# Patient Record
Sex: Male | Born: 1945 | Race: White | Hispanic: No | Marital: Married | Smoking: Former smoker
Health system: Southern US, Community
[De-identification: ages and names within clinical notes are randomized; demographics above are authoritative.]

## PROBLEM LIST (undated history)

## (undated) DIAGNOSIS — M109 Gout, unspecified: Secondary | ICD-10-CM

## (undated) DIAGNOSIS — M19011 Primary osteoarthritis, right shoulder: Secondary | ICD-10-CM

## (undated) DIAGNOSIS — I77810 Thoracic aortic ectasia: Secondary | ICD-10-CM

## (undated) DIAGNOSIS — I2510 Atherosclerotic heart disease of native coronary artery without angina pectoris: Secondary | ICD-10-CM

## (undated) DIAGNOSIS — Q231 Congenital insufficiency of aortic valve: Secondary | ICD-10-CM

## (undated) DIAGNOSIS — Z9889 Other specified postprocedural states: Secondary | ICD-10-CM

## (undated) DIAGNOSIS — E785 Hyperlipidemia, unspecified: Secondary | ICD-10-CM

## (undated) DIAGNOSIS — I77819 Aortic ectasia, unspecified site: Secondary | ICD-10-CM

## (undated) DIAGNOSIS — T8859XA Other complications of anesthesia, initial encounter: Secondary | ICD-10-CM

## (undated) DIAGNOSIS — K219 Gastro-esophageal reflux disease without esophagitis: Secondary | ICD-10-CM

## (undated) DIAGNOSIS — R112 Nausea with vomiting, unspecified: Secondary | ICD-10-CM

## (undated) DIAGNOSIS — R011 Cardiac murmur, unspecified: Secondary | ICD-10-CM

## (undated) DIAGNOSIS — I420 Dilated cardiomyopathy: Secondary | ICD-10-CM

## (undated) DIAGNOSIS — Q230 Congenital stenosis of aortic valve: Secondary | ICD-10-CM

## (undated) DIAGNOSIS — M1990 Unspecified osteoarthritis, unspecified site: Secondary | ICD-10-CM

## (undated) HISTORY — PX: PILONIDAL CYST EXCISION: SHX744

## (undated) HISTORY — DX: Hyperlipidemia, unspecified: E78.5

## (undated) HISTORY — PX: UPPER GI ENDOSCOPY: SHX6162

## (undated) HISTORY — DX: Congenital insufficiency of aortic valve: Q23.1

## (undated) HISTORY — DX: Aortic ectasia, unspecified site: I77.819

## (undated) HISTORY — DX: Congenital stenosis of aortic valve: Q23.0

## (undated) HISTORY — DX: Atherosclerotic heart disease of native coronary artery without angina pectoris: I25.10

## (undated) HISTORY — PX: COLONOSCOPY: SHX174

## (undated) HISTORY — DX: Thoracic aortic ectasia: I77.810

## (undated) HISTORY — DX: Dilated cardiomyopathy: I42.0

---

## 2000-05-02 ENCOUNTER — Encounter: Payer: Self-pay | Admitting: Emergency Medicine

## 2000-05-02 ENCOUNTER — Encounter: Admission: RE | Admit: 2000-05-02 | Discharge: 2000-05-02 | Payer: Self-pay | Admitting: Emergency Medicine

## 2001-03-11 ENCOUNTER — Encounter: Admission: RE | Admit: 2001-03-11 | Discharge: 2001-03-11 | Payer: Self-pay | Admitting: Emergency Medicine

## 2001-03-11 ENCOUNTER — Encounter: Payer: Self-pay | Admitting: Emergency Medicine

## 2001-08-05 ENCOUNTER — Encounter: Payer: Self-pay | Admitting: Emergency Medicine

## 2001-08-05 ENCOUNTER — Encounter: Admission: RE | Admit: 2001-08-05 | Discharge: 2001-08-05 | Payer: Self-pay | Admitting: Emergency Medicine

## 2001-08-12 ENCOUNTER — Encounter: Admission: RE | Admit: 2001-08-12 | Discharge: 2001-08-12 | Payer: Self-pay | Admitting: Emergency Medicine

## 2001-08-12 ENCOUNTER — Encounter: Payer: Self-pay | Admitting: Emergency Medicine

## 2002-10-28 ENCOUNTER — Encounter: Payer: Self-pay | Admitting: Emergency Medicine

## 2002-10-28 ENCOUNTER — Encounter: Admission: RE | Admit: 2002-10-28 | Discharge: 2002-10-28 | Payer: Self-pay | Admitting: Emergency Medicine

## 2002-11-20 ENCOUNTER — Encounter: Admission: RE | Admit: 2002-11-20 | Discharge: 2002-11-20 | Payer: Self-pay | Admitting: Emergency Medicine

## 2002-11-20 ENCOUNTER — Encounter: Payer: Self-pay | Admitting: Emergency Medicine

## 2006-06-04 HISTORY — PX: SHOULDER ARTHROSCOPY: SHX128

## 2012-12-22 DIAGNOSIS — R011 Cardiac murmur, unspecified: Secondary | ICD-10-CM | POA: Diagnosis not present

## 2012-12-22 DIAGNOSIS — M109 Gout, unspecified: Secondary | ICD-10-CM | POA: Diagnosis not present

## 2012-12-22 DIAGNOSIS — Z1331 Encounter for screening for depression: Secondary | ICD-10-CM | POA: Diagnosis not present

## 2012-12-22 DIAGNOSIS — IMO0002 Reserved for concepts with insufficient information to code with codable children: Secondary | ICD-10-CM | POA: Diagnosis not present

## 2012-12-22 DIAGNOSIS — F329 Major depressive disorder, single episode, unspecified: Secondary | ICD-10-CM | POA: Diagnosis not present

## 2012-12-22 DIAGNOSIS — Z6827 Body mass index (BMI) 27.0-27.9, adult: Secondary | ICD-10-CM | POA: Diagnosis not present

## 2012-12-22 DIAGNOSIS — R5383 Other fatigue: Secondary | ICD-10-CM | POA: Diagnosis not present

## 2012-12-22 DIAGNOSIS — R5381 Other malaise: Secondary | ICD-10-CM | POA: Diagnosis not present

## 2012-12-23 ENCOUNTER — Other Ambulatory Visit (HOSPITAL_COMMUNITY): Payer: Self-pay | Admitting: Internal Medicine

## 2012-12-23 DIAGNOSIS — R011 Cardiac murmur, unspecified: Secondary | ICD-10-CM

## 2012-12-24 ENCOUNTER — Ambulatory Visit (HOSPITAL_COMMUNITY): Payer: Medicare Other | Attending: Cardiology | Admitting: Radiology

## 2012-12-24 VITALS — BP 138/74 | Ht 73.0 in | Wt 196.0 lb

## 2012-12-24 DIAGNOSIS — R011 Cardiac murmur, unspecified: Secondary | ICD-10-CM

## 2012-12-24 NOTE — Progress Notes (Signed)
Echocardiogram performed.  

## 2013-01-21 DIAGNOSIS — L280 Lichen simplex chronicus: Secondary | ICD-10-CM | POA: Diagnosis not present

## 2013-01-21 DIAGNOSIS — L570 Actinic keratosis: Secondary | ICD-10-CM | POA: Diagnosis not present

## 2013-01-21 DIAGNOSIS — D485 Neoplasm of uncertain behavior of skin: Secondary | ICD-10-CM | POA: Diagnosis not present

## 2013-01-21 DIAGNOSIS — L821 Other seborrheic keratosis: Secondary | ICD-10-CM | POA: Diagnosis not present

## 2013-01-21 DIAGNOSIS — D239 Other benign neoplasm of skin, unspecified: Secondary | ICD-10-CM | POA: Diagnosis not present

## 2013-04-02 DIAGNOSIS — Z6827 Body mass index (BMI) 27.0-27.9, adult: Secondary | ICD-10-CM | POA: Diagnosis not present

## 2013-04-02 DIAGNOSIS — N529 Male erectile dysfunction, unspecified: Secondary | ICD-10-CM | POA: Diagnosis not present

## 2013-04-02 DIAGNOSIS — M109 Gout, unspecified: Secondary | ICD-10-CM | POA: Diagnosis not present

## 2013-04-02 DIAGNOSIS — R011 Cardiac murmur, unspecified: Secondary | ICD-10-CM | POA: Diagnosis not present

## 2013-04-02 DIAGNOSIS — IMO0002 Reserved for concepts with insufficient information to code with codable children: Secondary | ICD-10-CM | POA: Diagnosis not present

## 2013-06-18 DIAGNOSIS — M109 Gout, unspecified: Secondary | ICD-10-CM | POA: Diagnosis not present

## 2013-06-18 DIAGNOSIS — Z125 Encounter for screening for malignant neoplasm of prostate: Secondary | ICD-10-CM | POA: Diagnosis not present

## 2013-06-18 DIAGNOSIS — E785 Hyperlipidemia, unspecified: Secondary | ICD-10-CM | POA: Diagnosis not present

## 2013-06-24 DIAGNOSIS — M545 Low back pain, unspecified: Secondary | ICD-10-CM | POA: Diagnosis not present

## 2013-06-24 DIAGNOSIS — M542 Cervicalgia: Secondary | ICD-10-CM | POA: Diagnosis not present

## 2013-06-25 DIAGNOSIS — Z125 Encounter for screening for malignant neoplasm of prostate: Secondary | ICD-10-CM | POA: Diagnosis not present

## 2013-06-25 DIAGNOSIS — E785 Hyperlipidemia, unspecified: Secondary | ICD-10-CM | POA: Diagnosis not present

## 2013-06-25 DIAGNOSIS — IMO0002 Reserved for concepts with insufficient information to code with codable children: Secondary | ICD-10-CM | POA: Diagnosis not present

## 2013-06-25 DIAGNOSIS — H9190 Unspecified hearing loss, unspecified ear: Secondary | ICD-10-CM | POA: Diagnosis not present

## 2013-06-25 DIAGNOSIS — Z23 Encounter for immunization: Secondary | ICD-10-CM | POA: Diagnosis not present

## 2013-06-25 DIAGNOSIS — R413 Other amnesia: Secondary | ICD-10-CM | POA: Diagnosis not present

## 2013-06-25 DIAGNOSIS — Z0000 Encounter for general adult medical examination without abnormal findings: Secondary | ICD-10-CM | POA: Diagnosis not present

## 2013-06-25 DIAGNOSIS — M109 Gout, unspecified: Secondary | ICD-10-CM | POA: Diagnosis not present

## 2013-06-25 DIAGNOSIS — R002 Palpitations: Secondary | ICD-10-CM | POA: Diagnosis not present

## 2013-06-25 DIAGNOSIS — N529 Male erectile dysfunction, unspecified: Secondary | ICD-10-CM | POA: Diagnosis not present

## 2013-06-25 DIAGNOSIS — Z1331 Encounter for screening for depression: Secondary | ICD-10-CM | POA: Diagnosis not present

## 2013-10-12 DIAGNOSIS — M1710 Unilateral primary osteoarthritis, unspecified knee: Secondary | ICD-10-CM | POA: Diagnosis not present

## 2013-12-23 DIAGNOSIS — N529 Male erectile dysfunction, unspecified: Secondary | ICD-10-CM | POA: Diagnosis not present

## 2013-12-23 DIAGNOSIS — IMO0002 Reserved for concepts with insufficient information to code with codable children: Secondary | ICD-10-CM | POA: Diagnosis not present

## 2013-12-23 DIAGNOSIS — F329 Major depressive disorder, single episode, unspecified: Secondary | ICD-10-CM | POA: Diagnosis not present

## 2013-12-23 DIAGNOSIS — M109 Gout, unspecified: Secondary | ICD-10-CM | POA: Diagnosis not present

## 2013-12-23 DIAGNOSIS — F3289 Other specified depressive episodes: Secondary | ICD-10-CM | POA: Diagnosis not present

## 2014-01-20 DIAGNOSIS — L821 Other seborrheic keratosis: Secondary | ICD-10-CM | POA: Diagnosis not present

## 2014-01-20 DIAGNOSIS — L570 Actinic keratosis: Secondary | ICD-10-CM | POA: Diagnosis not present

## 2014-01-20 DIAGNOSIS — D485 Neoplasm of uncertain behavior of skin: Secondary | ICD-10-CM | POA: Diagnosis not present

## 2014-01-20 DIAGNOSIS — D235 Other benign neoplasm of skin of trunk: Secondary | ICD-10-CM | POA: Diagnosis not present

## 2014-03-15 DIAGNOSIS — M545 Low back pain: Secondary | ICD-10-CM | POA: Diagnosis not present

## 2014-03-23 DIAGNOSIS — Z23 Encounter for immunization: Secondary | ICD-10-CM | POA: Diagnosis not present

## 2014-05-10 DIAGNOSIS — M545 Low back pain: Secondary | ICD-10-CM | POA: Diagnosis not present

## 2014-05-10 DIAGNOSIS — Z6827 Body mass index (BMI) 27.0-27.9, adult: Secondary | ICD-10-CM | POA: Diagnosis not present

## 2014-05-14 ENCOUNTER — Other Ambulatory Visit: Payer: Self-pay | Admitting: Neurological Surgery

## 2014-05-14 DIAGNOSIS — M545 Low back pain: Secondary | ICD-10-CM

## 2014-05-19 DIAGNOSIS — M1711 Unilateral primary osteoarthritis, right knee: Secondary | ICD-10-CM | POA: Diagnosis not present

## 2014-05-23 ENCOUNTER — Other Ambulatory Visit: Payer: Medicare Other

## 2014-05-26 ENCOUNTER — Ambulatory Visit
Admission: RE | Admit: 2014-05-26 | Discharge: 2014-05-26 | Disposition: A | Discharge status: Home / Self Care | Payer: Medicare Other | Source: Ambulatory Visit | Attending: Neurological Surgery | Admitting: Neurological Surgery

## 2014-05-26 DIAGNOSIS — M4806 Spinal stenosis, lumbar region: Secondary | ICD-10-CM | POA: Diagnosis not present

## 2014-05-26 DIAGNOSIS — M4316 Spondylolisthesis, lumbar region: Secondary | ICD-10-CM | POA: Diagnosis not present

## 2014-05-26 DIAGNOSIS — M545 Low back pain: Secondary | ICD-10-CM

## 2014-06-07 DIAGNOSIS — M545 Low back pain: Secondary | ICD-10-CM | POA: Diagnosis not present

## 2014-06-07 DIAGNOSIS — R030 Elevated blood-pressure reading, without diagnosis of hypertension: Secondary | ICD-10-CM | POA: Diagnosis not present

## 2014-06-07 DIAGNOSIS — M4120 Other idiopathic scoliosis, site unspecified: Secondary | ICD-10-CM | POA: Diagnosis not present

## 2014-06-07 DIAGNOSIS — Z6827 Body mass index (BMI) 27.0-27.9, adult: Secondary | ICD-10-CM | POA: Diagnosis not present

## 2014-06-09 DIAGNOSIS — M4806 Spinal stenosis, lumbar region: Secondary | ICD-10-CM | POA: Diagnosis not present

## 2014-06-09 DIAGNOSIS — M5416 Radiculopathy, lumbar region: Secondary | ICD-10-CM | POA: Diagnosis not present

## 2014-06-21 DIAGNOSIS — E785 Hyperlipidemia, unspecified: Secondary | ICD-10-CM | POA: Diagnosis not present

## 2014-06-21 DIAGNOSIS — Z0000 Encounter for general adult medical examination without abnormal findings: Secondary | ICD-10-CM | POA: Diagnosis not present

## 2014-06-21 DIAGNOSIS — Z125 Encounter for screening for malignant neoplasm of prostate: Secondary | ICD-10-CM | POA: Diagnosis not present

## 2014-06-21 DIAGNOSIS — M109 Gout, unspecified: Secondary | ICD-10-CM | POA: Diagnosis not present

## 2014-06-28 DIAGNOSIS — Z1389 Encounter for screening for other disorder: Secondary | ICD-10-CM | POA: Diagnosis not present

## 2014-06-28 DIAGNOSIS — R413 Other amnesia: Secondary | ICD-10-CM | POA: Diagnosis not present

## 2014-06-28 DIAGNOSIS — Z125 Encounter for screening for malignant neoplasm of prostate: Secondary | ICD-10-CM | POA: Diagnosis not present

## 2014-06-28 DIAGNOSIS — M5380 Other specified dorsopathies, site unspecified: Secondary | ICD-10-CM | POA: Diagnosis not present

## 2014-06-28 DIAGNOSIS — Z23 Encounter for immunization: Secondary | ICD-10-CM | POA: Diagnosis not present

## 2014-06-28 DIAGNOSIS — H9190 Unspecified hearing loss, unspecified ear: Secondary | ICD-10-CM | POA: Diagnosis not present

## 2014-06-28 DIAGNOSIS — M5416 Radiculopathy, lumbar region: Secondary | ICD-10-CM | POA: Diagnosis not present

## 2014-06-28 DIAGNOSIS — M109 Gout, unspecified: Secondary | ICD-10-CM | POA: Diagnosis not present

## 2014-06-28 DIAGNOSIS — E785 Hyperlipidemia, unspecified: Secondary | ICD-10-CM | POA: Diagnosis not present

## 2014-06-28 DIAGNOSIS — Z008 Encounter for other general examination: Secondary | ICD-10-CM | POA: Diagnosis not present

## 2014-06-28 DIAGNOSIS — R011 Cardiac murmur, unspecified: Secondary | ICD-10-CM | POA: Diagnosis not present

## 2014-06-28 DIAGNOSIS — Z6828 Body mass index (BMI) 28.0-28.9, adult: Secondary | ICD-10-CM | POA: Diagnosis not present

## 2014-07-13 DIAGNOSIS — J0100 Acute maxillary sinusitis, unspecified: Secondary | ICD-10-CM | POA: Diagnosis not present

## 2014-07-13 DIAGNOSIS — J302 Other seasonal allergic rhinitis: Secondary | ICD-10-CM | POA: Diagnosis not present

## 2014-07-13 DIAGNOSIS — Z6829 Body mass index (BMI) 29.0-29.9, adult: Secondary | ICD-10-CM | POA: Diagnosis not present

## 2014-07-13 DIAGNOSIS — R05 Cough: Secondary | ICD-10-CM | POA: Diagnosis not present

## 2014-08-11 DIAGNOSIS — M545 Low back pain: Secondary | ICD-10-CM | POA: Diagnosis not present

## 2014-09-13 DIAGNOSIS — M545 Low back pain: Secondary | ICD-10-CM | POA: Diagnosis not present

## 2014-09-28 DIAGNOSIS — M5416 Radiculopathy, lumbar region: Secondary | ICD-10-CM | POA: Diagnosis not present

## 2014-09-28 DIAGNOSIS — J302 Other seasonal allergic rhinitis: Secondary | ICD-10-CM | POA: Diagnosis not present

## 2014-09-28 DIAGNOSIS — F328 Other depressive episodes: Secondary | ICD-10-CM | POA: Diagnosis not present

## 2014-09-28 DIAGNOSIS — N529 Male erectile dysfunction, unspecified: Secondary | ICD-10-CM | POA: Diagnosis not present

## 2014-09-28 DIAGNOSIS — N401 Enlarged prostate with lower urinary tract symptoms: Secondary | ICD-10-CM | POA: Diagnosis not present

## 2014-09-28 DIAGNOSIS — Z6828 Body mass index (BMI) 28.0-28.9, adult: Secondary | ICD-10-CM | POA: Diagnosis not present

## 2014-10-06 DIAGNOSIS — M545 Low back pain: Secondary | ICD-10-CM | POA: Diagnosis not present

## 2014-10-09 DIAGNOSIS — M545 Low back pain: Secondary | ICD-10-CM | POA: Diagnosis not present

## 2014-10-14 DIAGNOSIS — M545 Low back pain: Secondary | ICD-10-CM | POA: Diagnosis not present

## 2014-11-10 DIAGNOSIS — M545 Low back pain: Secondary | ICD-10-CM | POA: Diagnosis not present

## 2014-11-12 DIAGNOSIS — M545 Low back pain: Secondary | ICD-10-CM | POA: Diagnosis not present

## 2014-11-22 DIAGNOSIS — M545 Low back pain: Secondary | ICD-10-CM | POA: Diagnosis not present

## 2014-11-29 DIAGNOSIS — M545 Low back pain: Secondary | ICD-10-CM | POA: Diagnosis not present

## 2015-01-26 DIAGNOSIS — L570 Actinic keratosis: Secondary | ICD-10-CM | POA: Diagnosis not present

## 2015-01-26 DIAGNOSIS — L814 Other melanin hyperpigmentation: Secondary | ICD-10-CM | POA: Diagnosis not present

## 2015-01-26 DIAGNOSIS — L259 Unspecified contact dermatitis, unspecified cause: Secondary | ICD-10-CM | POA: Diagnosis not present

## 2015-01-26 DIAGNOSIS — L821 Other seborrheic keratosis: Secondary | ICD-10-CM | POA: Diagnosis not present

## 2015-01-26 DIAGNOSIS — D225 Melanocytic nevi of trunk: Secondary | ICD-10-CM | POA: Diagnosis not present

## 2015-03-02 DIAGNOSIS — M545 Low back pain: Secondary | ICD-10-CM | POA: Diagnosis not present

## 2015-03-04 DIAGNOSIS — M545 Low back pain: Secondary | ICD-10-CM | POA: Diagnosis not present

## 2015-03-09 DIAGNOSIS — M545 Low back pain: Secondary | ICD-10-CM | POA: Diagnosis not present

## 2015-03-16 DIAGNOSIS — M545 Low back pain: Secondary | ICD-10-CM | POA: Diagnosis not present

## 2015-03-23 DIAGNOSIS — M545 Low back pain: Secondary | ICD-10-CM | POA: Diagnosis not present

## 2015-03-29 DIAGNOSIS — N401 Enlarged prostate with lower urinary tract symptoms: Secondary | ICD-10-CM | POA: Diagnosis not present

## 2015-03-29 DIAGNOSIS — M5416 Radiculopathy, lumbar region: Secondary | ICD-10-CM | POA: Diagnosis not present

## 2015-03-29 DIAGNOSIS — Z23 Encounter for immunization: Secondary | ICD-10-CM | POA: Diagnosis not present

## 2015-03-29 DIAGNOSIS — M2550 Pain in unspecified joint: Secondary | ICD-10-CM | POA: Diagnosis not present

## 2015-03-29 DIAGNOSIS — Z6828 Body mass index (BMI) 28.0-28.9, adult: Secondary | ICD-10-CM | POA: Diagnosis not present

## 2015-03-29 DIAGNOSIS — N529 Male erectile dysfunction, unspecified: Secondary | ICD-10-CM | POA: Diagnosis not present

## 2015-03-30 DIAGNOSIS — M545 Low back pain: Secondary | ICD-10-CM | POA: Diagnosis not present

## 2015-04-07 DIAGNOSIS — Z6828 Body mass index (BMI) 28.0-28.9, adult: Secondary | ICD-10-CM | POA: Diagnosis not present

## 2015-04-07 DIAGNOSIS — R7989 Other specified abnormal findings of blood chemistry: Secondary | ICD-10-CM | POA: Diagnosis not present

## 2015-04-07 DIAGNOSIS — R2689 Other abnormalities of gait and mobility: Secondary | ICD-10-CM | POA: Diagnosis not present

## 2015-04-08 DIAGNOSIS — R2689 Other abnormalities of gait and mobility: Secondary | ICD-10-CM | POA: Diagnosis not present

## 2015-04-20 DIAGNOSIS — M791 Myalgia: Secondary | ICD-10-CM | POA: Diagnosis not present

## 2015-04-20 DIAGNOSIS — R768 Other specified abnormal immunological findings in serum: Secondary | ICD-10-CM | POA: Diagnosis not present

## 2015-04-20 DIAGNOSIS — M2550 Pain in unspecified joint: Secondary | ICD-10-CM | POA: Diagnosis not present

## 2015-04-26 DIAGNOSIS — M1711 Unilateral primary osteoarthritis, right knee: Secondary | ICD-10-CM | POA: Diagnosis not present

## 2015-06-28 DIAGNOSIS — N5319 Other ejaculatory dysfunction: Secondary | ICD-10-CM | POA: Diagnosis not present

## 2015-06-28 DIAGNOSIS — N5201 Erectile dysfunction due to arterial insufficiency: Secondary | ICD-10-CM | POA: Diagnosis not present

## 2015-06-28 DIAGNOSIS — Z0000 Encounter for general adult medical examination without abnormal findings: Secondary | ICD-10-CM | POA: Diagnosis not present

## 2015-07-05 DIAGNOSIS — N5201 Erectile dysfunction due to arterial insufficiency: Secondary | ICD-10-CM | POA: Diagnosis not present

## 2015-07-14 DIAGNOSIS — Z125 Encounter for screening for malignant neoplasm of prostate: Secondary | ICD-10-CM | POA: Diagnosis not present

## 2015-07-14 DIAGNOSIS — D6489 Other specified anemias: Secondary | ICD-10-CM | POA: Diagnosis not present

## 2015-07-14 DIAGNOSIS — E784 Other hyperlipidemia: Secondary | ICD-10-CM | POA: Diagnosis not present

## 2015-07-14 DIAGNOSIS — M109 Gout, unspecified: Secondary | ICD-10-CM | POA: Diagnosis not present

## 2015-07-20 DIAGNOSIS — M2550 Pain in unspecified joint: Secondary | ICD-10-CM | POA: Diagnosis not present

## 2015-07-20 DIAGNOSIS — M791 Myalgia: Secondary | ICD-10-CM | POA: Diagnosis not present

## 2015-07-20 DIAGNOSIS — R768 Other specified abnormal immunological findings in serum: Secondary | ICD-10-CM | POA: Diagnosis not present

## 2015-07-20 DIAGNOSIS — M150 Primary generalized (osteo)arthritis: Secondary | ICD-10-CM | POA: Diagnosis not present

## 2015-07-21 DIAGNOSIS — R2689 Other abnormalities of gait and mobility: Secondary | ICD-10-CM | POA: Diagnosis not present

## 2015-07-21 DIAGNOSIS — E784 Other hyperlipidemia: Secondary | ICD-10-CM | POA: Diagnosis not present

## 2015-07-21 DIAGNOSIS — N401 Enlarged prostate with lower urinary tract symptoms: Secondary | ICD-10-CM | POA: Diagnosis not present

## 2015-07-21 DIAGNOSIS — Z6827 Body mass index (BMI) 27.0-27.9, adult: Secondary | ICD-10-CM | POA: Diagnosis not present

## 2015-07-21 DIAGNOSIS — M2550 Pain in unspecified joint: Secondary | ICD-10-CM | POA: Diagnosis not present

## 2015-07-21 DIAGNOSIS — D696 Thrombocytopenia, unspecified: Secondary | ICD-10-CM | POA: Diagnosis not present

## 2015-07-21 DIAGNOSIS — M109 Gout, unspecified: Secondary | ICD-10-CM | POA: Diagnosis not present

## 2015-07-21 DIAGNOSIS — Z1389 Encounter for screening for other disorder: Secondary | ICD-10-CM | POA: Diagnosis not present

## 2015-07-21 DIAGNOSIS — D649 Anemia, unspecified: Secondary | ICD-10-CM | POA: Diagnosis not present

## 2015-07-21 DIAGNOSIS — D6489 Other specified anemias: Secondary | ICD-10-CM | POA: Diagnosis not present

## 2015-07-21 DIAGNOSIS — Z0000 Encounter for general adult medical examination without abnormal findings: Secondary | ICD-10-CM | POA: Diagnosis not present

## 2015-07-21 DIAGNOSIS — R413 Other amnesia: Secondary | ICD-10-CM | POA: Diagnosis not present

## 2015-07-21 DIAGNOSIS — I358 Other nonrheumatic aortic valve disorders: Secondary | ICD-10-CM | POA: Diagnosis not present

## 2015-07-27 DIAGNOSIS — D6489 Other specified anemias: Secondary | ICD-10-CM | POA: Diagnosis not present

## 2015-07-27 DIAGNOSIS — R413 Other amnesia: Secondary | ICD-10-CM | POA: Diagnosis not present

## 2015-09-13 DIAGNOSIS — Z0000 Encounter for general adult medical examination without abnormal findings: Secondary | ICD-10-CM | POA: Diagnosis not present

## 2015-09-13 DIAGNOSIS — R361 Hematospermia: Secondary | ICD-10-CM | POA: Diagnosis not present

## 2015-09-14 DIAGNOSIS — L821 Other seborrheic keratosis: Secondary | ICD-10-CM | POA: Diagnosis not present

## 2015-09-14 DIAGNOSIS — D485 Neoplasm of uncertain behavior of skin: Secondary | ICD-10-CM | POA: Diagnosis not present

## 2015-11-07 DIAGNOSIS — M1711 Unilateral primary osteoarthritis, right knee: Secondary | ICD-10-CM | POA: Diagnosis not present

## 2015-12-08 DIAGNOSIS — M1711 Unilateral primary osteoarthritis, right knee: Secondary | ICD-10-CM | POA: Diagnosis not present

## 2015-12-15 DIAGNOSIS — M1711 Unilateral primary osteoarthritis, right knee: Secondary | ICD-10-CM | POA: Diagnosis not present

## 2015-12-27 DIAGNOSIS — M1711 Unilateral primary osteoarthritis, right knee: Secondary | ICD-10-CM | POA: Diagnosis not present

## 2016-01-13 DIAGNOSIS — M25561 Pain in right knee: Secondary | ICD-10-CM | POA: Diagnosis not present

## 2016-01-13 DIAGNOSIS — M25511 Pain in right shoulder: Secondary | ICD-10-CM | POA: Diagnosis not present

## 2016-01-25 DIAGNOSIS — D1801 Hemangioma of skin and subcutaneous tissue: Secondary | ICD-10-CM | POA: Diagnosis not present

## 2016-01-25 DIAGNOSIS — L570 Actinic keratosis: Secondary | ICD-10-CM | POA: Diagnosis not present

## 2016-01-25 DIAGNOSIS — L821 Other seborrheic keratosis: Secondary | ICD-10-CM | POA: Diagnosis not present

## 2016-01-25 DIAGNOSIS — D225 Melanocytic nevi of trunk: Secondary | ICD-10-CM | POA: Diagnosis not present

## 2016-02-02 DIAGNOSIS — R768 Other specified abnormal immunological findings in serum: Secondary | ICD-10-CM | POA: Diagnosis not present

## 2016-02-02 DIAGNOSIS — M150 Primary generalized (osteo)arthritis: Secondary | ICD-10-CM | POA: Diagnosis not present

## 2016-02-02 DIAGNOSIS — M791 Myalgia: Secondary | ICD-10-CM | POA: Diagnosis not present

## 2016-02-02 DIAGNOSIS — M1A09X0 Idiopathic chronic gout, multiple sites, without tophus (tophi): Secondary | ICD-10-CM | POA: Diagnosis not present

## 2016-02-02 DIAGNOSIS — M7989 Other specified soft tissue disorders: Secondary | ICD-10-CM | POA: Diagnosis not present

## 2016-02-02 DIAGNOSIS — M2550 Pain in unspecified joint: Secondary | ICD-10-CM | POA: Diagnosis not present

## 2016-02-20 DIAGNOSIS — M7989 Other specified soft tissue disorders: Secondary | ICD-10-CM | POA: Diagnosis not present

## 2016-02-20 DIAGNOSIS — M791 Myalgia: Secondary | ICD-10-CM | POA: Diagnosis not present

## 2016-02-20 DIAGNOSIS — M2550 Pain in unspecified joint: Secondary | ICD-10-CM | POA: Diagnosis not present

## 2016-02-20 DIAGNOSIS — R768 Other specified abnormal immunological findings in serum: Secondary | ICD-10-CM | POA: Diagnosis not present

## 2016-02-20 DIAGNOSIS — M150 Primary generalized (osteo)arthritis: Secondary | ICD-10-CM | POA: Diagnosis not present

## 2016-02-20 DIAGNOSIS — M1A09X0 Idiopathic chronic gout, multiple sites, without tophus (tophi): Secondary | ICD-10-CM | POA: Diagnosis not present

## 2016-02-28 DIAGNOSIS — L570 Actinic keratosis: Secondary | ICD-10-CM | POA: Diagnosis not present

## 2016-02-28 DIAGNOSIS — D485 Neoplasm of uncertain behavior of skin: Secondary | ICD-10-CM | POA: Diagnosis not present

## 2016-03-29 DIAGNOSIS — M19011 Primary osteoarthritis, right shoulder: Secondary | ICD-10-CM | POA: Diagnosis not present

## 2016-03-29 DIAGNOSIS — M19012 Primary osteoarthritis, left shoulder: Secondary | ICD-10-CM | POA: Diagnosis not present

## 2016-04-14 DIAGNOSIS — Z23 Encounter for immunization: Secondary | ICD-10-CM | POA: Diagnosis not present

## 2016-05-17 DIAGNOSIS — J209 Acute bronchitis, unspecified: Secondary | ICD-10-CM | POA: Diagnosis not present

## 2016-05-17 DIAGNOSIS — J0100 Acute maxillary sinusitis, unspecified: Secondary | ICD-10-CM | POA: Diagnosis not present

## 2016-05-17 DIAGNOSIS — R05 Cough: Secondary | ICD-10-CM | POA: Diagnosis not present

## 2016-05-21 DIAGNOSIS — M150 Primary generalized (osteo)arthritis: Secondary | ICD-10-CM | POA: Diagnosis not present

## 2016-05-21 DIAGNOSIS — M1A09X0 Idiopathic chronic gout, multiple sites, without tophus (tophi): Secondary | ICD-10-CM | POA: Diagnosis not present

## 2016-05-21 DIAGNOSIS — M2550 Pain in unspecified joint: Secondary | ICD-10-CM | POA: Diagnosis not present

## 2016-05-21 DIAGNOSIS — Z6827 Body mass index (BMI) 27.0-27.9, adult: Secondary | ICD-10-CM | POA: Diagnosis not present

## 2016-05-21 DIAGNOSIS — R768 Other specified abnormal immunological findings in serum: Secondary | ICD-10-CM | POA: Diagnosis not present

## 2016-05-21 DIAGNOSIS — E663 Overweight: Secondary | ICD-10-CM | POA: Diagnosis not present

## 2016-06-10 DIAGNOSIS — Z041 Encounter for examination and observation following transport accident: Secondary | ICD-10-CM | POA: Diagnosis not present

## 2016-06-10 DIAGNOSIS — M1990 Unspecified osteoarthritis, unspecified site: Secondary | ICD-10-CM | POA: Diagnosis not present

## 2016-06-13 ENCOUNTER — Other Ambulatory Visit: Payer: Self-pay | Admitting: Orthopedic Surgery

## 2016-06-13 DIAGNOSIS — M19011 Primary osteoarthritis, right shoulder: Secondary | ICD-10-CM | POA: Diagnosis not present

## 2016-06-13 DIAGNOSIS — M25511 Pain in right shoulder: Secondary | ICD-10-CM

## 2016-06-15 ENCOUNTER — Other Ambulatory Visit: Payer: Medicare Other

## 2016-06-15 DIAGNOSIS — I358 Other nonrheumatic aortic valve disorders: Secondary | ICD-10-CM | POA: Diagnosis not present

## 2016-06-15 DIAGNOSIS — J209 Acute bronchitis, unspecified: Secondary | ICD-10-CM | POA: Diagnosis not present

## 2016-06-15 DIAGNOSIS — D649 Anemia, unspecified: Secondary | ICD-10-CM | POA: Diagnosis not present

## 2016-06-15 DIAGNOSIS — D696 Thrombocytopenia, unspecified: Secondary | ICD-10-CM | POA: Diagnosis not present

## 2016-06-15 DIAGNOSIS — R05 Cough: Secondary | ICD-10-CM | POA: Diagnosis not present

## 2016-06-15 DIAGNOSIS — F39 Unspecified mood [affective] disorder: Secondary | ICD-10-CM | POA: Diagnosis not present

## 2016-06-15 DIAGNOSIS — Z1389 Encounter for screening for other disorder: Secondary | ICD-10-CM | POA: Diagnosis not present

## 2016-06-15 DIAGNOSIS — M25511 Pain in right shoulder: Secondary | ICD-10-CM | POA: Diagnosis not present

## 2016-06-15 DIAGNOSIS — Z6829 Body mass index (BMI) 29.0-29.9, adult: Secondary | ICD-10-CM | POA: Diagnosis not present

## 2016-06-16 DIAGNOSIS — M542 Cervicalgia: Secondary | ICD-10-CM | POA: Diagnosis not present

## 2016-06-18 ENCOUNTER — Ambulatory Visit
Admission: RE | Admit: 2016-06-18 | Discharge: 2016-06-18 | Disposition: A | Discharge status: Home / Self Care | Payer: Medicare Other | Source: Ambulatory Visit | Attending: Orthopedic Surgery | Admitting: Orthopedic Surgery

## 2016-06-18 DIAGNOSIS — M19011 Primary osteoarthritis, right shoulder: Secondary | ICD-10-CM | POA: Diagnosis not present

## 2016-06-18 DIAGNOSIS — M25511 Pain in right shoulder: Secondary | ICD-10-CM

## 2016-06-19 DIAGNOSIS — M542 Cervicalgia: Secondary | ICD-10-CM | POA: Diagnosis not present

## 2016-06-22 DIAGNOSIS — M542 Cervicalgia: Secondary | ICD-10-CM | POA: Diagnosis not present

## 2016-06-26 DIAGNOSIS — M542 Cervicalgia: Secondary | ICD-10-CM | POA: Diagnosis not present

## 2016-07-02 DIAGNOSIS — M542 Cervicalgia: Secondary | ICD-10-CM | POA: Diagnosis not present

## 2016-07-11 ENCOUNTER — Other Ambulatory Visit: Payer: Self-pay | Admitting: Orthopedic Surgery

## 2016-07-12 ENCOUNTER — Encounter (HOSPITAL_COMMUNITY)
Admission: RE | Admit: 2016-07-12 | Discharge: 2016-07-12 | Disposition: A | Discharge status: Home / Self Care | Payer: Medicare Other | Source: Ambulatory Visit | Attending: Orthopedic Surgery | Admitting: Orthopedic Surgery

## 2016-07-12 ENCOUNTER — Encounter (HOSPITAL_COMMUNITY): Payer: Self-pay

## 2016-07-12 DIAGNOSIS — Z01812 Encounter for preprocedural laboratory examination: Secondary | ICD-10-CM | POA: Insufficient documentation

## 2016-07-12 DIAGNOSIS — Z01810 Encounter for preprocedural cardiovascular examination: Secondary | ICD-10-CM | POA: Insufficient documentation

## 2016-07-12 DIAGNOSIS — M19011 Primary osteoarthritis, right shoulder: Secondary | ICD-10-CM | POA: Insufficient documentation

## 2016-07-12 HISTORY — DX: Gastro-esophageal reflux disease without esophagitis: K21.9

## 2016-07-12 HISTORY — DX: Unspecified osteoarthritis, unspecified site: M19.90

## 2016-07-12 LAB — CBC
HCT: 40.1 % (ref 39.0–52.0)
Hemoglobin: 13.8 g/dL (ref 13.0–17.0)
MCH: 32.5 pg (ref 26.0–34.0)
MCHC: 34.4 g/dL (ref 30.0–36.0)
MCV: 94.6 fL (ref 78.0–100.0)
Platelets: 126 10*3/uL — ABNORMAL LOW (ref 150–400)
RBC: 4.24 MIL/uL (ref 4.22–5.81)
RDW: 12.8 % (ref 11.5–15.5)
WBC: 5 10*3/uL (ref 4.0–10.5)

## 2016-07-12 LAB — SURGICAL PCR SCREEN
MRSA, PCR: NEGATIVE
Staphylococcus aureus: NEGATIVE

## 2016-07-12 NOTE — Pre-Procedure Instructions (Signed)
    Theodore Patton  07/12/2016      [facility] [address] [place], [place] [address] Phone: [phone] Fax: [phone]    Your procedure is scheduled on Tues. Feb 20  Report to [facility] Admitting at 5:30A.M.  Call this number if you have problems the morning of surgery:  [phone]   Remember:  Do not eat food or drink liquids after midnight on Mon. Feb. 19   Take these medicines the morning of surgery with A SIP OF WATER :tylenol, allopurinol(zyloprim), bupropion(welbutrin), flonase nasal spray if needed, gabapentin(neurontin), tamsulosin(flomax), ventolin inhaler if needed--bring to hospital              1 week prior to surgery stop: aspirin, advil, motrin, ibuprofen, celebrex, BC Powders, Goody's, fish oil,vitamins/herbal medicines   Do not wear jewelry, make-up or nail polish.  Do not wear lotions, powders, or perfumes, or deoderant.  Do not shave 48 hours prior to surgery.  Men may shave face and neck.  Do not bring valuables to the hospital.  [facility] is not responsible for any belongings or valuables.  Contacts, dentures or bridgework may not be worn into surgery.  Leave your suitcase in the car.  After surgery it may be brought to your room.  For patients admitted to the hospital, discharge time will be determined by your treatment team.  Patients discharged the day of surgery will not be allowed to drive home.    Special instructions:  Review preparing for surgery  Please read over the following fact sheets that you were given. Coughing and Deep Breathing and MRSA Information

## 2016-07-12 NOTE — Progress Notes (Signed)
PCP: Dr. Dagmar Hait @ [facility]

## 2016-07-16 DIAGNOSIS — Z125 Encounter for screening for malignant neoplasm of prostate: Secondary | ICD-10-CM | POA: Diagnosis not present

## 2016-07-16 DIAGNOSIS — N401 Enlarged prostate with lower urinary tract symptoms: Secondary | ICD-10-CM | POA: Diagnosis not present

## 2016-07-16 DIAGNOSIS — E784 Other hyperlipidemia: Secondary | ICD-10-CM | POA: Diagnosis not present

## 2016-07-16 DIAGNOSIS — M109 Gout, unspecified: Secondary | ICD-10-CM | POA: Diagnosis not present

## 2016-07-23 DIAGNOSIS — N401 Enlarged prostate with lower urinary tract symptoms: Secondary | ICD-10-CM | POA: Diagnosis not present

## 2016-07-23 DIAGNOSIS — Z6828 Body mass index (BMI) 28.0-28.9, adult: Secondary | ICD-10-CM | POA: Diagnosis not present

## 2016-07-23 DIAGNOSIS — D692 Other nonthrombocytopenic purpura: Secondary | ICD-10-CM | POA: Diagnosis not present

## 2016-07-23 DIAGNOSIS — F39 Unspecified mood [affective] disorder: Secondary | ICD-10-CM | POA: Diagnosis not present

## 2016-07-23 DIAGNOSIS — I358 Other nonrheumatic aortic valve disorders: Secondary | ICD-10-CM | POA: Diagnosis not present

## 2016-07-23 DIAGNOSIS — Z1389 Encounter for screening for other disorder: Secondary | ICD-10-CM | POA: Diagnosis not present

## 2016-07-23 DIAGNOSIS — R7989 Other specified abnormal findings of blood chemistry: Secondary | ICD-10-CM | POA: Diagnosis not present

## 2016-07-23 DIAGNOSIS — M25511 Pain in right shoulder: Secondary | ICD-10-CM | POA: Diagnosis not present

## 2016-07-23 DIAGNOSIS — Z0000 Encounter for general adult medical examination without abnormal findings: Secondary | ICD-10-CM | POA: Diagnosis not present

## 2016-07-23 DIAGNOSIS — E784 Other hyperlipidemia: Secondary | ICD-10-CM | POA: Diagnosis not present

## 2016-07-23 DIAGNOSIS — M109 Gout, unspecified: Secondary | ICD-10-CM | POA: Diagnosis not present

## 2016-07-23 DIAGNOSIS — D696 Thrombocytopenia, unspecified: Secondary | ICD-10-CM | POA: Diagnosis not present

## 2016-07-24 ENCOUNTER — Encounter (HOSPITAL_COMMUNITY)
Admission: RE | Disposition: A | Discharge status: Home / Self Care | Payer: Self-pay | Source: Ambulatory Visit | Attending: Orthopedic Surgery

## 2016-07-24 ENCOUNTER — Inpatient Hospital Stay (HOSPITAL_COMMUNITY): Payer: Medicare Other

## 2016-07-24 ENCOUNTER — Inpatient Hospital Stay (HOSPITAL_COMMUNITY): Payer: Medicare Other | Admitting: Anesthesiology

## 2016-07-24 ENCOUNTER — Inpatient Hospital Stay (HOSPITAL_COMMUNITY)
Admission: RE | Admit: 2016-07-24 | Discharge: 2016-07-26 | DRG: 483 | Disposition: A | Discharge status: Home / Self Care | Payer: Medicare Other | Source: Ambulatory Visit | Attending: Orthopedic Surgery | Admitting: Orthopedic Surgery

## 2016-07-24 ENCOUNTER — Encounter (HOSPITAL_COMMUNITY): Payer: Self-pay

## 2016-07-24 DIAGNOSIS — Z471 Aftercare following joint replacement surgery: Secondary | ICD-10-CM | POA: Diagnosis not present

## 2016-07-24 DIAGNOSIS — Z882 Allergy status to sulfonamides status: Secondary | ICD-10-CM

## 2016-07-24 DIAGNOSIS — Z87891 Personal history of nicotine dependence: Secondary | ICD-10-CM | POA: Diagnosis not present

## 2016-07-24 DIAGNOSIS — Z79899 Other long term (current) drug therapy: Secondary | ICD-10-CM | POA: Diagnosis not present

## 2016-07-24 DIAGNOSIS — M25711 Osteophyte, right shoulder: Secondary | ICD-10-CM | POA: Diagnosis not present

## 2016-07-24 DIAGNOSIS — Z7982 Long term (current) use of aspirin: Secondary | ICD-10-CM | POA: Diagnosis not present

## 2016-07-24 DIAGNOSIS — M19011 Primary osteoarthritis, right shoulder: Secondary | ICD-10-CM | POA: Diagnosis not present

## 2016-07-24 DIAGNOSIS — Z96611 Presence of right artificial shoulder joint: Secondary | ICD-10-CM | POA: Diagnosis not present

## 2016-07-24 DIAGNOSIS — K219 Gastro-esophageal reflux disease without esophagitis: Secondary | ICD-10-CM | POA: Diagnosis not present

## 2016-07-24 DIAGNOSIS — G8918 Other acute postprocedural pain: Secondary | ICD-10-CM | POA: Diagnosis not present

## 2016-07-24 HISTORY — DX: Primary osteoarthritis, right shoulder: M19.011

## 2016-07-24 HISTORY — PX: TOTAL SHOULDER ARTHROPLASTY: SHX126

## 2016-07-24 SURGERY — ARTHROPLASTY, SHOULDER, TOTAL
Anesthesia: Regional | Laterality: Right

## 2016-07-24 MED ORDER — PHENYLEPHRINE 40 MCG/ML (10ML) SYRINGE FOR IV PUSH (FOR BLOOD PRESSURE SUPPORT)
PREFILLED_SYRINGE | INTRAVENOUS | Status: AC
  Filled 2016-07-24: qty 10

## 2016-07-24 MED ORDER — ONDANSETRON HCL 4 MG PO TABS: 4.0000 mg | ORAL_TABLET | Freq: Four times a day (QID) | ORAL | Status: DC | PRN

## 2016-07-24 MED ORDER — SUGAMMADEX SODIUM 200 MG/2ML IV SOLN
INTRAVENOUS | Status: AC
  Filled 2016-07-24: qty 2

## 2016-07-24 MED ORDER — MIDAZOLAM HCL 5 MG/5ML IJ SOLN
INTRAMUSCULAR | Status: DC | PRN
  Administered 2016-07-24: 1 mg via INTRAVENOUS

## 2016-07-24 MED ORDER — SUCCINYLCHOLINE CHLORIDE 20 MG/ML IJ SOLN
INTRAMUSCULAR | Status: DC | PRN
  Administered 2016-07-24: 200 mg via INTRAVENOUS

## 2016-07-24 MED ORDER — CEFAZOLIN IN D5W 1 GM/50ML IV SOLN
1.0000 g | Freq: Four times a day (QID) | INTRAVENOUS | Status: AC
  Administered 2016-07-24 – 2016-07-25 (×3): 1 g via INTRAVENOUS
  Filled 2016-07-24 (×3): qty 50

## 2016-07-24 MED ORDER — ASPIRIN EC 81 MG PO TBEC
81.0000 mg | DELAYED_RELEASE_TABLET | Freq: Every day | ORAL | Status: DC
  Administered 2016-07-24 – 2016-07-25 (×2): 81 mg via ORAL
  Filled 2016-07-24 (×2): qty 1

## 2016-07-24 MED ORDER — EPHEDRINE 5 MG/ML INJ
INTRAVENOUS | Status: AC
  Filled 2016-07-24: qty 10

## 2016-07-24 MED ORDER — METHOCARBAMOL 1000 MG/10ML IJ SOLN: 500.0000 mg | Freq: Four times a day (QID) | INTRAVENOUS | Status: DC | PRN

## 2016-07-24 MED ORDER — SUCCINYLCHOLINE CHLORIDE 200 MG/10ML IV SOSY
PREFILLED_SYRINGE | INTRAVENOUS | Status: AC
  Filled 2016-07-24: qty 10

## 2016-07-24 MED ORDER — MENTHOL 3 MG MT LOZG: 1.0000 | LOZENGE | OROMUCOSAL | Status: DC | PRN

## 2016-07-24 MED ORDER — BISACODYL 10 MG RE SUPP: 10.0000 mg | Freq: Every day | RECTAL | Status: DC | PRN

## 2016-07-24 MED ORDER — ROCURONIUM BROMIDE 100 MG/10ML IV SOLN
INTRAVENOUS | Status: DC | PRN
  Administered 2016-07-24: 50 mg via INTRAVENOUS

## 2016-07-24 MED ORDER — METOCLOPRAMIDE HCL 5 MG PO TABS: 5.0000 mg | ORAL_TABLET | Freq: Three times a day (TID) | ORAL | Status: DC | PRN

## 2016-07-24 MED ORDER — DIPHENHYDRAMINE HCL 12.5 MG/5ML PO ELIX: 12.5000 mg | ORAL_SOLUTION | ORAL | Status: DC | PRN

## 2016-07-24 MED ORDER — ONDANSETRON HCL 4 MG/2ML IJ SOLN
INTRAMUSCULAR | Status: AC
  Filled 2016-07-24: qty 2

## 2016-07-24 MED ORDER — PROPOFOL 10 MG/ML IV BOLUS
INTRAVENOUS | Status: AC
  Filled 2016-07-24: qty 40

## 2016-07-24 MED ORDER — ONDANSETRON HCL 4 MG PO TABS: 4.0000 mg | ORAL_TABLET | Freq: Three times a day (TID) | ORAL | 0 refills | Status: DC | PRN

## 2016-07-24 MED ORDER — MIDAZOLAM HCL 2 MG/2ML IJ SOLN
INTRAMUSCULAR | Status: AC
  Filled 2016-07-24: qty 2

## 2016-07-24 MED ORDER — PHENYLEPHRINE HCL 10 MG/ML IJ SOLN
INTRAMUSCULAR | Status: DC | PRN
  Administered 2016-07-24 (×2): 100 ug/min via INTRAVENOUS

## 2016-07-24 MED ORDER — POLYETHYLENE GLYCOL 3350 17 G PO PACK: 17.0000 g | PACK | Freq: Every day | ORAL | Status: DC | PRN

## 2016-07-24 MED ORDER — HYDROMORPHONE HCL 2 MG/ML IJ SOLN
0.5000 mg | INTRAMUSCULAR | Status: DC | PRN
  Administered 2016-07-24 – 2016-07-25 (×5): 0.5 mg via INTRAVENOUS
  Filled 2016-07-24 (×5): qty 1

## 2016-07-24 MED ORDER — SENNA 8.6 MG PO TABS
1.0000 | ORAL_TABLET | Freq: Two times a day (BID) | ORAL | Status: DC
  Administered 2016-07-24 – 2016-07-26 (×5): 8.6 mg via ORAL
  Filled 2016-07-24 (×5): qty 1

## 2016-07-24 MED ORDER — EPHEDRINE SULFATE 50 MG/ML IJ SOLN
INTRAMUSCULAR | Status: DC | PRN
  Administered 2016-07-24: 5 mg via INTRAVENOUS
  Administered 2016-07-24: 10 mg via INTRAVENOUS
  Administered 2016-07-24: 15 mg via INTRAVENOUS

## 2016-07-24 MED ORDER — FLUTICASONE PROPIONATE 50 MCG/ACT NA SUSP
1.0000 | Freq: Every evening | NASAL | Status: DC | PRN
  Filled 2016-07-24: qty 16

## 2016-07-24 MED ORDER — PHENYLEPHRINE HCL 10 MG/ML IJ SOLN
INTRAMUSCULAR | Status: DC | PRN
  Administered 2016-07-24 (×5): 80 ug via INTRAVENOUS

## 2016-07-24 MED ORDER — SUGAMMADEX SODIUM 200 MG/2ML IV SOLN
INTRAVENOUS | Status: DC | PRN
  Administered 2016-07-24: 200 mg via INTRAVENOUS

## 2016-07-24 MED ORDER — GABAPENTIN 300 MG PO CAPS
600.0000 mg | ORAL_CAPSULE | Freq: Three times a day (TID) | ORAL | Status: DC
  Administered 2016-07-24 – 2016-07-26 (×6): 600 mg via ORAL
  Filled 2016-07-24 (×6): qty 2

## 2016-07-24 MED ORDER — ALUM & MAG HYDROXIDE-SIMETH 200-200-20 MG/5ML PO SUSP
30.0000 mL | ORAL | Status: DC | PRN
  Administered 2016-07-25: 30 mL via ORAL
  Filled 2016-07-24: qty 30

## 2016-07-24 MED ORDER — BACLOFEN 10 MG PO TABS: 10.0000 mg | ORAL_TABLET | Freq: Three times a day (TID) | ORAL | 0 refills | Status: DC

## 2016-07-24 MED ORDER — METOCLOPRAMIDE HCL 5 MG/ML IJ SOLN: 5.0000 mg | Freq: Three times a day (TID) | INTRAMUSCULAR | Status: DC | PRN

## 2016-07-24 MED ORDER — 0.9 % SODIUM CHLORIDE (POUR BTL) OPTIME
TOPICAL | Status: DC | PRN
Start: 2016-07-24 — End: 2016-07-24
  Administered 2016-07-24: 1000 mL

## 2016-07-24 MED ORDER — OXYCODONE-ACETAMINOPHEN 10-325 MG PO TABS: 1.0000 | ORAL_TABLET | Freq: Four times a day (QID) | ORAL | 0 refills | Status: DC | PRN

## 2016-07-24 MED ORDER — LACTATED RINGERS IV SOLN
INTRAVENOUS | Status: DC | PRN
  Administered 2016-07-24 (×2): via INTRAVENOUS

## 2016-07-24 MED ORDER — SENNA-DOCUSATE SODIUM 8.6-50 MG PO TABS: 2.0000 | ORAL_TABLET | Freq: Every day | ORAL | 1 refills | Status: DC

## 2016-07-24 MED ORDER — LIDOCAINE 2% (20 MG/ML) 5 ML SYRINGE
INTRAMUSCULAR | Status: AC
  Filled 2016-07-24: qty 5

## 2016-07-24 MED ORDER — PROPOFOL 10 MG/ML IV BOLUS
INTRAVENOUS | Status: DC | PRN
  Administered 2016-07-24: 160 mg via INTRAVENOUS

## 2016-07-24 MED ORDER — ROCURONIUM BROMIDE 50 MG/5ML IV SOSY
PREFILLED_SYRINGE | INTRAVENOUS | Status: AC
  Filled 2016-07-24: qty 5

## 2016-07-24 MED ORDER — TAMSULOSIN HCL 0.4 MG PO CAPS
0.4000 mg | ORAL_CAPSULE | Freq: Every day | ORAL | Status: DC
  Administered 2016-07-24 – 2016-07-25 (×2): 0.4 mg via ORAL
  Filled 2016-07-24 (×2): qty 1

## 2016-07-24 MED ORDER — ADULT MULTIVITAMIN W/MINERALS CH
1.0000 | ORAL_TABLET | Freq: Every day | ORAL | Status: DC
  Administered 2016-07-24 – 2016-07-25 (×2): 1 via ORAL
  Filled 2016-07-24 (×2): qty 1

## 2016-07-24 MED ORDER — FENTANYL CITRATE (PF) 100 MCG/2ML IJ SOLN
INTRAMUSCULAR | Status: AC
  Filled 2016-07-24: qty 4

## 2016-07-24 MED ORDER — ONDANSETRON HCL 4 MG/2ML IJ SOLN
INTRAMUSCULAR | Status: DC | PRN
  Administered 2016-07-24: 4 mg via INTRAVENOUS

## 2016-07-24 MED ORDER — ACETAMINOPHEN 650 MG RE SUPP: 650.0000 mg | Freq: Four times a day (QID) | RECTAL | Status: DC | PRN

## 2016-07-24 MED ORDER — POTASSIUM CHLORIDE IN NACL 20-0.45 MEQ/L-% IV SOLN
INTRAVENOUS | Status: DC
  Administered 2016-07-24: 14:00:00 via INTRAVENOUS
  Filled 2016-07-24 (×2): qty 1000

## 2016-07-24 MED ORDER — SODIUM CHLORIDE 0.9 % IR SOLN
Status: DC | PRN
  Administered 2016-07-24: 3000 mL

## 2016-07-24 MED ORDER — PHENOL 1.4 % MT LIQD: 1.0000 | OROMUCOSAL | Status: DC | PRN

## 2016-07-24 MED ORDER — DOCUSATE SODIUM 100 MG PO CAPS
100.0000 mg | ORAL_CAPSULE | Freq: Two times a day (BID) | ORAL | Status: DC
  Administered 2016-07-24 – 2016-07-26 (×5): 100 mg via ORAL
  Filled 2016-07-24 (×5): qty 1

## 2016-07-24 MED ORDER — PHENYLEPHRINE HCL 10 MG/ML IJ SOLN
INTRAMUSCULAR | Status: AC
  Filled 2016-07-24: qty 2

## 2016-07-24 MED ORDER — ALLOPURINOL 300 MG PO TABS
300.0000 mg | ORAL_TABLET | Freq: Every day | ORAL | Status: DC
  Administered 2016-07-25 – 2016-07-26 (×2): 300 mg via ORAL
  Filled 2016-07-24 (×2): qty 1

## 2016-07-24 MED ORDER — ONDANSETRON HCL 4 MG/2ML IJ SOLN
4.0000 mg | Freq: Four times a day (QID) | INTRAMUSCULAR | Status: DC | PRN
  Administered 2016-07-25: 4 mg via INTRAVENOUS
  Filled 2016-07-24: qty 2

## 2016-07-24 MED ORDER — MAGNESIUM CITRATE PO SOLN
1.0000 | Freq: Once | ORAL | Status: DC | PRN
Start: 2016-07-24 — End: 2016-07-26

## 2016-07-24 MED ORDER — LORATADINE 10 MG PO TABS
10.0000 mg | ORAL_TABLET | Freq: Every day | ORAL | Status: DC
  Administered 2016-07-24 – 2016-07-25 (×2): 10 mg via ORAL
  Filled 2016-07-24 (×2): qty 1

## 2016-07-24 MED ORDER — OXYCODONE HCL 5 MG PO TABS
5.0000 mg | ORAL_TABLET | ORAL | Status: DC | PRN
  Administered 2016-07-24: 10 mg via ORAL
  Administered 2016-07-24: 5 mg via ORAL
  Administered 2016-07-24 – 2016-07-26 (×13): 10 mg via ORAL
  Filled 2016-07-24 (×11): qty 2
  Filled 2016-07-24: qty 1
  Filled 2016-07-24 (×4): qty 2

## 2016-07-24 MED ORDER — METHOCARBAMOL 500 MG PO TABS
500.0000 mg | ORAL_TABLET | Freq: Four times a day (QID) | ORAL | Status: DC | PRN
  Administered 2016-07-24 – 2016-07-26 (×7): 500 mg via ORAL
  Filled 2016-07-24 (×7): qty 1

## 2016-07-24 MED ORDER — ACETAMINOPHEN 325 MG PO TABS
650.0000 mg | ORAL_TABLET | Freq: Four times a day (QID) | ORAL | Status: DC | PRN
  Administered 2016-07-24 – 2016-07-26 (×6): 650 mg via ORAL
  Filled 2016-07-24 (×6): qty 2

## 2016-07-24 MED ORDER — ALBUTEROL SULFATE (2.5 MG/3ML) 0.083% IN NEBU: 3.0000 mL | INHALATION_SOLUTION | Freq: Four times a day (QID) | RESPIRATORY_TRACT | Status: DC | PRN

## 2016-07-24 MED ORDER — ALBUMIN HUMAN 5 % IV SOLN
INTRAVENOUS | Status: DC | PRN
  Administered 2016-07-24 (×2): via INTRAVENOUS

## 2016-07-24 MED ORDER — FENTANYL CITRATE (PF) 100 MCG/2ML IJ SOLN
INTRAMUSCULAR | Status: DC | PRN
  Administered 2016-07-24 (×2): 50 ug via INTRAVENOUS

## 2016-07-24 MED ORDER — ROPIVACAINE HCL 7.5 MG/ML IJ SOLN
INTRAMUSCULAR | Status: DC | PRN
  Administered 2016-07-24: 20 mL via PERINEURAL

## 2016-07-24 MED ORDER — CEFAZOLIN SODIUM-DEXTROSE 2-4 GM/100ML-% IV SOLN
2.0000 g | INTRAVENOUS | Status: AC
  Administered 2016-07-24: 2 g via INTRAVENOUS
  Filled 2016-07-24: qty 100

## 2016-07-24 MED ORDER — BUPROPION HCL ER (XL) 150 MG PO TB24
300.0000 mg | ORAL_TABLET | Freq: Every day | ORAL | Status: DC
  Administered 2016-07-25 – 2016-07-26 (×2): 300 mg via ORAL
  Filled 2016-07-24 (×2): qty 2

## 2016-07-24 MED ORDER — LIDOCAINE HCL (CARDIAC) 20 MG/ML IV SOLN
INTRAVENOUS | Status: DC | PRN
  Administered 2016-07-24: 100 mg via INTRAVENOUS

## 2016-07-24 SURGICAL SUPPLY — 54 items
BLADE SAW SGTL MED 73X18.5 STR (BLADE) ×3 IMPLANT
CAPT SHLDR TOTAL 2 ×2 IMPLANT
CEMENT BONE DEPUY (Cement) ×2 IMPLANT
CLOSURE STERI-STRIP 1/2X4 (GAUZE/BANDAGES/DRESSINGS) ×1
CLSR STERI-STRIP ANTIMIC 1/2X4 (GAUZE/BANDAGES/DRESSINGS) ×2 IMPLANT
COVER SURGICAL LIGHT HANDLE (MISCELLANEOUS) ×3 IMPLANT
DRAPE ORTHO SPLIT 77X108 STRL (DRAPES) ×6
DRAPE PROXIMA HALF (DRAPES) ×3 IMPLANT
DRAPE SURG ORHT 6 SPLT 77X108 (DRAPES) ×2 IMPLANT
DRAPE U-SHAPE 47X51 STRL (DRAPES) ×3 IMPLANT
DRSG MEPILEX BORDER 4X8 (GAUZE/BANDAGES/DRESSINGS) ×1 IMPLANT
DURAPREP 26ML APPLICATOR (WOUND CARE) ×3 IMPLANT
ELECT BLADE 4.0 EZ CLEAN MEGAD (MISCELLANEOUS) ×3
ELECT REM PT RETURN 9FT ADLT (ELECTROSURGICAL) ×3
ELECTRODE BLDE 4.0 EZ CLN MEGD (MISCELLANEOUS) IMPLANT
ELECTRODE REM PT RTRN 9FT ADLT (ELECTROSURGICAL) ×1 IMPLANT
GLOVE BIOGEL PI ORTHO PRO SZ8 (GLOVE) ×4
GLOVE ORTHO TXT STRL SZ7.5 (GLOVE) ×3 IMPLANT
GLOVE PI ORTHO PRO STRL SZ8 (GLOVE) ×2 IMPLANT
GLOVE SURG ORTHO 8.0 STRL STRW (GLOVE) ×3 IMPLANT
GOWN STRL REUS W/ TWL XL LVL3 (GOWN DISPOSABLE) ×1 IMPLANT
GOWN STRL REUS W/TWL 2XL LVL3 (GOWN DISPOSABLE) ×3 IMPLANT
GOWN STRL REUS W/TWL XL LVL3 (GOWN DISPOSABLE) ×3
HANDPIECE INTERPULSE COAX TIP (DISPOSABLE) ×3
HOOD PEEL AWAY FACE SHEILD DIS (HOOD) ×6 IMPLANT
KIT BASIN OR (CUSTOM PROCEDURE TRAY) ×3 IMPLANT
KIT ROOM TURNOVER OR (KITS) ×3 IMPLANT
MANIFOLD NEPTUNE II (INSTRUMENTS) ×3 IMPLANT
NS IRRIG 1000ML POUR BTL (IV SOLUTION) ×3 IMPLANT
PACK SHOULDER (CUSTOM PROCEDURE TRAY) ×3 IMPLANT
PAD ARMBOARD 7.5X6 YLW CONV (MISCELLANEOUS) ×6 IMPLANT
PIN HUMERAL STMN 3.2MMX9IN (INSTRUMENTS) IMPLANT
SET HNDPC FAN SPRY TIP SCT (DISPOSABLE) ×1 IMPLANT
SLING ARM IMMOBILIZER LRG (SOFTGOODS) IMPLANT
SLING ARM IMMOBILIZER MED (SOFTGOODS) IMPLANT
SMARTMIX MINI TOWER (MISCELLANEOUS) ×3
SPONGE LAP 18X18 X RAY DECT (DISPOSABLE) ×2 IMPLANT
SUCTION FRAZIER HANDLE 10FR (MISCELLANEOUS) ×2
SUCTION TUBE FRAZIER 10FR DISP (MISCELLANEOUS) ×1 IMPLANT
SUPPORT WRAP ARM LG (MISCELLANEOUS) ×3 IMPLANT
SUT FIBERWIRE #2 38 REV NDL BL (SUTURE) ×18
SUT MNCRL AB 4-0 PS2 18 (SUTURE) IMPLANT
SUT VIC AB 0 CT1 27 (SUTURE) ×3
SUT VIC AB 0 CT1 27XBRD ANBCTR (SUTURE) ×1 IMPLANT
SUT VIC AB 2-0 CT1 27 (SUTURE) ×3
SUT VIC AB 2-0 CT1 TAPERPNT 27 (SUTURE) ×1 IMPLANT
SUT VIC AB 3-0 SH 8-18 (SUTURE) ×3 IMPLANT
SUTURE FIBERWR#2 38 REV NDL BL (SUTURE) ×6 IMPLANT
TOWEL OR 17X24 6PK STRL BLUE (TOWEL DISPOSABLE) ×1 IMPLANT
TOWEL OR 17X26 10 PK STRL BLUE (TOWEL DISPOSABLE) ×1 IMPLANT
TOWER SMARTMIX MINI (MISCELLANEOUS) IMPLANT
TUBE CONNECTING 12'X1/4 (SUCTIONS)
TUBE CONNECTING 12X1/4 (SUCTIONS) IMPLANT
YANKAUER SUCT BULB TIP NO VENT (SUCTIONS) ×3 IMPLANT

## 2016-07-24 NOTE — H&P (Signed)
PREOPERATIVE H&P  Chief Complaint: DJD RIGHT SHOULDER  HPI: Theodore Patton is a 71 y.o. male who presents for preoperative history and physical with a diagnosis of DJD RIGHT SHOULDER. Symptoms are rated as moderate to severe, and have been worsening.  This is significantly impairing activities of daily living.  He has elected for surgical management.   He has failed injections, activity modification, previous surgery, anti-inflammatories, and assistive devices.  Preoperative X-rays demonstrate end stage degenerative changes with osteophyte formation, loss of joint space, subchondral sclerosis.   Past Medical History:  Diagnosis Date  . Arthritis   . GERD (gastroesophageal reflux disease)    Past Surgical History:  Procedure Laterality Date  . PILONIDAL CYST EXCISION    . SHOULDER ARTHROSCOPY Right 2008   Social History   Social History  . Marital status: Married    Spouse name: N/A  . Number of children: N/A  . Years of education: N/A   Social History Main Topics  . Smoking status: Former Smoker    Types: Cigarettes    Quit date: 07/12/1980  . Smokeless tobacco: Never Used  . Alcohol use 0.6 oz/week    1 Cans of beer per week  . Drug use: No  . Sexual activity: Not Asked   Other Topics Concern  . None   Social History Narrative  . None   History reviewed. No pertinent family history. Allergies  Allergen Reactions  . Sulfa Antibiotics Other (See Comments)    Blisters in mouth   Prior to Admission medications   Medication Sig Start Date End Date Taking? Authorizing Provider  acetaminophen (TYLENOL) 500 MG tablet Take 1,000 mg by mouth 2 (two) times daily.   Yes Historical Provider, MD  allopurinol (ZYLOPRIM) 300 MG tablet Take 300 mg by mouth daily. 05/16/16  Yes Historical Provider, MD  aspirin EC 81 MG tablet Take 81 mg by mouth at bedtime.   Yes Historical Provider, MD  buPROPion (WELLBUTRIN XL) 300 MG 24 hr tablet Take 300 mg by mouth daily. 06/15/16  Yes  Historical Provider, MD  celecoxib (CELEBREX) 200 MG capsule Take 200 mg by mouth daily at 3 pm.   Yes Historical Provider, MD  fluticasone (FLONASE) 50 MCG/ACT nasal spray Place 1-2 sprays into both nostrils at bedtime as needed. For allergies/nasal congestion 06/11/16  Yes Historical Provider, MD  gabapentin (NEURONTIN) 300 MG capsule Take 600 mg by mouth 3 (three) times daily. 04/03/16  Yes Historical Provider, MD  Glucosamine-Chondroitin (COSAMIN DS PO) Take 2 tablets by mouth 2 (two) times daily.   Yes Historical Provider, MD  loratadine (CLARITIN) 10 MG tablet Take 10 mg by mouth at bedtime.   Yes Historical Provider, MD  Multiple Vitamin (MULTIVITAMIN WITH MINERALS) TABS tablet Take 1 tablet by mouth daily. Complete Multivitamin   Yes Historical Provider, MD  Omega-3 Fatty Acids (FISH OIL) 1200 MG CAPS Take 1,200 mg by mouth 2 (two) times daily.   Yes Historical Provider, MD  tamsulosin (FLOMAX) 0.4 MG CAPS capsule Take 0.4 mg by mouth at bedtime. 05/16/16  Yes Historical Provider, MD  VENTOLIN HFA 108 (90 Base) MCG/ACT inhaler Inhale 1-2 puffs into the lungs every 6 (six) hours as needed. For wheezing/shortness of breath/asthma 05/25/16   Historical Provider, MD     Positive ROS: All other systems have been reviewed and were otherwise negative with the exception of those mentioned in the HPI and as above.  Physical Exam: General: Alert, no acute distress Cardiovascular: No pedal edema Respiratory: No  cyanosis, no use of accessory musculature GI: No organomegaly, abdomen is soft and non-tender Skin: No lesions in the area of chief complaint Neurologic: Sensation intact distally Psychiatric: Patient is competent for consent with normal mood and affect Lymphatic: No axillary or cervical lymphadenopathy  MUSCULOSKELETAL: right shoulder AROM 0-170 ER to 10, positive crepitance with ROM.  Assessment: DJD RIGHT SHOULDER   Plan: Plan for Procedure(s): TOTAL SHOULDER ARTHROPLASTY  The  risks benefits and alternatives were discussed with the patient including but not limited to the risks of nonoperative treatment, versus surgical intervention including infection, bleeding, nerve injury,  blood clots, cardiopulmonary complications, morbidity, mortality, among others, and they were willing to proceed.   Johnny Bridge, MD Cell [phone]   07/24/2016 7:17 AM

## 2016-07-24 NOTE — Progress Notes (Signed)
Report given to maryann shaver rn as caregiver 

## 2016-07-24 NOTE — Op Note (Signed)
07/24/2016  10:11 AM  PATIENT:  Theodore Patton    PRE-OPERATIVE DIAGNOSIS:  RIGHT SHOULDER primary localized osteoarthritis  POST-OPERATIVE DIAGNOSIS:  Same  PROCEDURE:  Right TOTAL SHOULDER ARTHROPLASTY  SURGEON:  Johnny Bridge, MD  PHYSICIAN ASSISTANT: Joya Gaskins, OPA-C, present and scrubbed throughout the case, critical for completion in a timely fashion, and for retraction, instrumentation, and closure.  ANESTHESIA:   General  PREOPERATIVE INDICATIONS:  Theodore Patton is a  71 y.o. male with a diagnosis of primary localized osteoarthritis RIGHT SHOULDER who failed conservative measures and elected for surgical management.    The risks benefits and alternatives were discussed with the patient preoperatively including but not limited to the risks of infection, bleeding, nerve injury, cardiopulmonary complications, the need for revision surgery, dislocation, loosening, incomplete relief of pain, among others, and the patient was willing to proceed.   OPERATIVE IMPLANTS: Biomet size 13 my press-fit humeral stem, size 46+18 Versa-dial humeral head, set in the E position with increased coverage posteriorly, with a medium cemented glenoid polyethylene 3 peg implant with a central regenerex noncemented post.   OPERATIVE FINDINGS: Advanced glenohumeral osteoarthritis involving the glenoid and the humeral head with substantial osteophyte formation inferiorly. There was 1 loose body anteriorly which I found and removed. The glenoid had substantial bone loss, and in order to obtain adequate fixation and the vault, I had to remove some anterior bone to optimize position and version. In doing so, the anterior aspect of the glenoid was somewhat deficient, and I had the superior hole and the posterior hole contained within the vault, but the anterior hole was really only two thirds circumference, and therefore I did not cement the anterior peg, I had bicortical position with the central post as well.      OPERATIVE PROCEDURE: The patient was brought to the operating room and placed in the supine position. General anesthesia was administered. IV antibiotics were given.  The upper extremity was prepped and draped in usual sterile fashion. The patient was in a beachchair position with all bony prominences padded.   Time out was performed and a deltopectoral approach was carried out. The biceps tendon was tenodesed to the pectoralis tendon. The subscapularis was released, tagging it with a #2 FiberWire, leaving a cuff of tendon for repair.   The inferior osteophyte was removed, and release of the capsule off of the humeral side was completed. The head was dislocated, and I reamed sequentially. I placed the humeral cutting guide at 30 of retroversion, and then pinned this into place, and made my humeral neck cut. This was at the appropriate level.   I then placed deep retractors and exposed the glenoid. I excised the labrum circumferentially, taking care to protect the axillary nerve inferiorly.   I then placed a guidewire into the center position, controlling appropriate version and inclination. His glenoid wear was extreme, and he had an extremely flat and widened glenoid face. I then reamed over the guidewire with the small reamer, and was satisfied with the preparation. I removed as much anterior bone as I comfortably could, and I preserved the subchondral bone in order to maximize the strength and minimize the risk for subsequent subsidence.   I then drilled the central hole for the regenerex peg, and then placed the guide, and then drilled the 3 peripheral peg holes. I had excellent bony circumferential contact. The anterior hole as indicated above was not a complete hole, as the anterior third of this hole was breached.  I then cleaned the glenoid, irrigated it copiously, and then dried it and cemented the prosthesis into place. Excellent seating was achieved. I had full exposure. The cement  cured, and then I turned my attention to the humeral side.   I sequentially broached, up to the selected size, with the broach set at 30 of retroversion. I then placed the real stem. I trialed with multiple heads, and the above-named component was selected. Increased posterior coverage improved the coverage. The soft tissue tension was appropriate.   I then impacted the real humeral head into place, reduced the head, and irrigated copiously. Excellent stability and range of motion was achieved. I repaired the subscapularis with 4 #2 FiberWire, as well as the rotator interval, and irrigated copiously once more. The subcutaneous tissue was closed with Vicryl including the deltopectoral fascia.   The skin was closed with Steri-Strips and sterile gauze was applied. He had a preoperative nerve block. He tolerated the procedure well and there were no complications.

## 2016-07-24 NOTE — Progress Notes (Signed)
Orthopedic Tech Progress Note Patient Details:  Theodore Patton [birth year] [id number]  Ortho Devices Type of Ortho Device: Shoulder immobilizer Ortho Device/Splint Interventions: Application   [place] Pink 07/24/2016, 2:46 PM

## 2016-07-24 NOTE — Anesthesia Procedure Notes (Signed)
Procedure Name: Intubation Date/Time: 07/24/2016 7:42 AM Performed by: Scheryl Darter Pre-anesthesia Checklist: Patient identified, Emergency Drugs available, Suction available and Patient being monitored Patient Re-evaluated:Patient Re-evaluated prior to inductionOxygen Delivery Method: Circle System Utilized Preoxygenation: Pre-oxygenation with 100% oxygen Intubation Type: IV induction Ventilation: Mask ventilation without difficulty Laryngoscope Size: Miller and 3 Grade View: Grade I Tube type: Oral Number of attempts: 1 Airway Equipment and Method: Stylet Placement Confirmation: ETT inserted through vocal cords under direct vision,  positive ETCO2 and breath sounds checked- equal and bilateral Tube secured with: Tape Dental Injury: Teeth and Oropharynx as per pre-operative assessment

## 2016-07-24 NOTE — Transfer of Care (Signed)
  Immediate Anesthesia Transfer of Care Note  Patient: Theodore Patton  Procedure(s) Performed: Procedure(s): TOTAL SHOULDER ARTHROPLASTY (Right)  Patient Location: PACU  Anesthesia Type:General  Level of Consciousness: awake, alert , oriented and sedated  Airway & Oxygen Therapy: Patient Spontanous Breathing and Patient connected to nasal cannula oxygen  Post-op Assessment: Report given to RN, Post -op Vital signs reviewed and stable and Patient moving all extremities  Post vital signs: Reviewed and stable  Last Vitals:  Vitals:   07/24/16 0555  BP: 137/71  Pulse: 79  Resp: 20  Temp: 36.4 C    Last Pain:  Vitals:   07/24/16 0555  TempSrc: Oral  PainSc:       Patients Stated Pain Goal: 8 ([id number])  Complications: No apparent anesthesia complications

## 2016-07-24 NOTE — Progress Notes (Signed)
Care of pt assumed by MA Read Bonelli RN 

## 2016-07-24 NOTE — Discharge Instructions (Signed)

## 2016-07-24 NOTE — Anesthesia Preprocedure Evaluation (Addendum)
Anesthesia Evaluation  Patient identified by MRN, date of birth, ID band Patient awake    Reviewed: Allergy & Precautions, NPO status , Patient's Chart, lab work & pertinent test results  Airway Mallampati: II  TM Distance: >3 FB Neck ROM: Full    Dental  (+) Teeth Intact, Dental Advisory Given   Pulmonary former smoker,    Pulmonary exam normal breath sounds clear to auscultation       Cardiovascular negative cardio ROS Normal cardiovascular exam Rhythm:Regular Rate:Normal     Neuro/Psych negative neurological ROS     GI/Hepatic Neg liver ROS, GERD  Medicated,  Endo/Other  negative endocrine ROS  Renal/GU negative Renal ROS     Musculoskeletal  (+) Arthritis , Osteoarthritis,    Abdominal   Peds  Hematology  (+) Blood dyscrasia (Plt 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  Reproductive/Obstetrics                            Anesthesia Physical Anesthesia Plan  ASA: II  Anesthesia Plan: General   Post-op Pain Management:  Regional for Post-op pain   Induction: Intravenous  Airway Management Planned: Oral ETT  Additional Equipment:   Intra-op Plan:   Post-operative Plan: Extubation in OR  Informed Consent: I have reviewed the patients History and Physical, chart, labs and discussed the procedure including the risks, benefits and alternatives for the proposed anesthesia with the patient or authorized representative who has indicated his/her understanding and acceptance.   Dental advisory given  Plan Discussed with: CRNA  Anesthesia Plan Comments: (Risks/benefits of general anesthesia discussed with patient including risk of damage to teeth, lips, gum, and tongue, nausea/vomiting, allergic reactions to medications, and the possibility of heart attack, stroke and death.  All patient questions answered.  Patient wishes to proceed.)         Anesthesia Quick Evaluation

## 2016-07-24 NOTE — Anesthesia Postprocedure Evaluation (Signed)
Anesthesia Post Note  Patient: Theodore Patton  Procedure(s) Performed: Procedure(s) (LRB): TOTAL SHOULDER ARTHROPLASTY (Right)  Patient location during evaluation: PACU Anesthesia Type: General Level of consciousness: awake and alert Pain management: pain level controlled Vital Signs Assessment: post-procedure vital signs reviewed and stable Respiratory status: spontaneous breathing, nonlabored ventilation, respiratory function stable and patient connected to nasal cannula oxygen Cardiovascular status: blood pressure returned to baseline and stable Postop Assessment: no signs of nausea or vomiting Anesthetic complications: no       Last Vitals:  Vitals:   07/24/16 1159 07/24/16 1200  BP: 103/60   Pulse: 81 80  Resp: 17 (!) 22  Temp: 36.3 C     Last Pain:  Vitals:   07/24/16 1306  TempSrc:   PainSc: [name]

## 2016-07-24 NOTE — Anesthesia Procedure Notes (Signed)
Anesthesia Regional Block: Interscalene brachial plexus block   Pre-Anesthetic Checklist: ,, timeout performed, Correct Patient, Correct Site, Correct Laterality, Correct Procedure, Correct Position, site marked, Risks and benefits discussed,  Surgical consent,  Pre-op evaluation,  At surgeon's request and post-op pain management  Laterality: Right  Prep: chloraprep       Needles:  Injection technique: Single-shot  Needle Type: Echogenic Stimulator Needle     Needle Length: 5cm  Needle Gauge: 22     Additional Needles:   Procedures: ultrasound guided,,,,,,,,  Narrative:  Start time: 07/24/2016 6:58 AM End time: 07/24/2016 7:03 AM Injection made incrementally with aspirations every 5 mL.  Performed by: Personally  Anesthesiologist: Catalina Gravel  Additional Notes: Functioning IV was confirmed and monitors were applied.  A 28mm 22ga Arrow echogenic stimulator needle was used. Sterile prep and drape, hand hygiene, and sterile gloves were used.  Negative aspiration and negative test dose prior to incremental administration of local anesthetic. The patient tolerated the procedure well.  Ultrasound guidance: relevent anatomy identified, needle position confirmed, local anesthetic spread visualized around nerve(s), vascular puncture avoided.  Image printed for medical record.

## 2016-07-25 ENCOUNTER — Encounter (HOSPITAL_COMMUNITY): Payer: Self-pay | Admitting: Orthopedic Surgery

## 2016-07-25 LAB — CBC
HCT: 33 % — ABNORMAL LOW (ref 39.0–52.0)
Hemoglobin: 11.2 g/dL — ABNORMAL LOW (ref 13.0–17.0)
MCH: 32.7 pg (ref 26.0–34.0)
MCHC: 33.9 g/dL (ref 30.0–36.0)
MCV: 96.2 fL (ref 78.0–100.0)
Platelets: 129 10*3/uL — ABNORMAL LOW (ref 150–400)
RBC: 3.43 MIL/uL — ABNORMAL LOW (ref 4.22–5.81)
RDW: 13.1 % (ref 11.5–15.5)
WBC: 8.3 10*3/uL (ref 4.0–10.5)

## 2016-07-25 LAB — BASIC METABOLIC PANEL
Anion gap: 10 (ref 5–15)
BUN: 14 mg/dL (ref 6–20)
CO2: 27 mmol/L (ref 22–32)
Calcium: 8.5 mg/dL — ABNORMAL LOW (ref 8.9–10.3)
Chloride: 98 mmol/L — ABNORMAL LOW (ref 101–111)
Creatinine, Ser: 1.01 mg/dL (ref 0.61–1.24)
GFR calc Af Amer: >60 mL/min (ref >60)
GFR calc non Af Amer: >60 mL/min (ref >60)
Glucose, Bld: 153 mg/dL — ABNORMAL HIGH (ref 65–99)
Potassium: 4.1 mmol/L (ref 3.5–5.1)
Sodium: 135 mmol/L (ref 135–145)

## 2016-07-25 NOTE — Progress Notes (Signed)
Patient ID: Theodore Patton, male   DOB: Apr 19, 1946, 71 y.o.   MRN: [id number]     Subjective:  Patient reports pain as mild to moderate.  Patient has some nausea and is concerned about being on nasal cannula still  Objective:   VITALS:   Vitals:   07/24/16 1904 07/24/16 2006 07/25/16 0024 07/25/16 0643  BP:  121/64 (!) 145/59 116/62  Pulse:  90 87 75  Resp:  16 16 16   Temp: 98.6 F (37 C) 97.8 F (36.6 C) 98.7 F (37.1 C) 98.5 F (36.9 C)  TempSrc: Oral Oral Oral Oral  SpO2:  96% 94% 92%  Weight:      Height:        ABD soft Sensation intact distally Dorsiflexion/Plantar flexion intact Incision: dressing C/D/I and no drainage Good hand and wrist motion  Lab Results  Component Value Date   WBC 8.3 07/25/2016   HGB 11.2 (L) 07/25/2016   HCT 33.0 (L) 07/25/2016   MCV 96.2 07/25/2016   PLT 129 (L) 07/25/2016   BMET    Component Value Date/Time   NA 135 07/25/2016 0433   K 4.1 07/25/2016 0433   CL 98 (L) 07/25/2016 0433   CO2 27 07/25/2016 0433   GLUCOSE 153 (H) 07/25/2016 0433   BUN 14 07/25/2016 0433   CREATININE 1.01 07/25/2016 0433   CALCIUM 8.5 (L) 07/25/2016 0433   GFRNONAA >60 07/25/2016 0433   GFRAA >60 07/25/2016 0433     Assessment/Plan: 1 Day Post-Op   Principal Problem:   Primary localized osteoarthrosis of right shoulder Active Problems:   S/P shoulder replacement, right   Advance diet Up with therapy Plan for discharge tomorrow Continue to monitor O2  May place on clear liquid diet    DOUGLAS PARRY, BRANDON 07/25/2016, 8:51 AM  Discussed and agree with above.    Marchia Bond, MD Cell [phone]

## 2016-07-25 NOTE — Progress Notes (Signed)
PT Cancellation Note  Patient Details Name: Theodore Patton MRN: [id number] DOB: [birth year]   Cancelled Treatment:    Reason Eval/Treat Not Completed: OT notifying PT that the pt does not have any mobility needs at this time. Will sign off. Thank you for the referral.    Cassell Clement, PT, CSCS Pager [phone] Office [phone]  07/25/2016, 12:21 PM

## 2016-07-25 NOTE — Evaluation (Signed)
Occupational Therapy Evaluation Patient Details Name: Theodore Patton MRN: [id number] DOB: [birth year] Today's Date: 07/25/2016    History of Present Illness Right TOTAL SHOULDER ARTHROPLASTY   Clinical Impression   Pt requires min A with ADLs and Mod I with ADL mobility with slow, guarded pace. Pt and wife educated on ADL techniques, postioning, ROM allowed by MD, precautions, sling wear. Pt will have 24/7 assist at home from his wife. All education completed and no further acute OT indicated at this time    Follow Up Recommendations  No OT follow up;Other (comment) (progress rehab of R shoulder per MD when appropriate)    Equipment Recommendations  Other (comment) Management consultant)    Recommendations for Other Services       Precautions / Restrictions Precautions Precautions: Shoulder Shoulder Interventions: Shoulder sling/immobilizer Restrictions Weight Bearing Restrictions: Yes RUE Weight Bearing: Non weight bearing      Mobility Bed Mobility Overal bed mobility: Modified Independent             General bed mobility comments: used rails, no pyhsical assist needed  Transfers Overall transfer level: Modified independent Equipment used: None;1 person hand held assist             General transfer comment: slow, guarded pace    Balance Overall balance assessment: No apparent balance deficits (not formally assessed)                                          ADL Overall ADL's : Needs assistance/impaired     Grooming: Wash/dry hands;Wash/dry face;Standing;Set up;Supervision/safety   Upper Body Bathing: Minimal assistance   Lower Body Bathing: Minimal assistance   Upper Body Dressing : Minimal assistance   Lower Body Dressing: Minimal assistance   Toilet Transfer: Modified Independent   Toileting- Clothing Manipulation and Hygiene: Minimal assistance   Tub/ Shower Transfer: Modified independent   Functional mobility during ADLs: Modified  independent       Vision   Vision Assessment?: No apparent visual deficits                Pertinent Vitals/Pain Pain Assessment: 0-10 Pain Score: 7  Pain Location: R shoulder Pain Descriptors / Indicators: Sore;Aching Pain Intervention(s): Limited activity within patient's tolerance;Monitored during session;Repositioned;Ice applied;Patient requesting pain meds-RN notified     Hand Dominance Right   Extremity/Trunk Assessment Upper Extremity Assessment Upper Extremity Assessment: Generalized weakness;RUE deficits/detail RUE: Unable to fully assess due to immobilization   Lower Extremity Assessment Lower Extremity Assessment: Defer to PT evaluation   Cervical / Trunk Assessment Cervical / Trunk Assessment: Normal   Communication Communication Communication: No difficulties   Cognition Arousal/Alertness: Awake/alert Behavior During Therapy: WFL for tasks assessed/performed Overall Cognitive Status: Within Functional Limits for tasks assessed                     General Comments  slow, guarded pace    Exercises   Other Exercises Other Exercises: ROM of R elbow, wrist, hand   Shoulder Instructions Shoulder Instructions Donning/doffing shirt without moving shoulder: Minimal assistance;Caregiver independent with task;Patient able to independently direct caregiver Method for sponge bathing under operated UE: Minimal assistance;Caregiver independent with task;Patient able to independently direct caregiver Donning/doffing sling/immobilizer: Minimal assistance;Caregiver independent with task;Patient able to independently direct caregiver Correct positioning of sling/immobilizer: Minimal assistance;Caregiver independent with task;Patient able to independently direct caregiver ROM for elbow, wrist and digits  of operated UE: Supervision/safety;Caregiver independent with task;Patient able to independently direct caregiver Sling wearing schedule (on at all times/off for  ADL's): Caregiver independent with task;Patient able to independently direct caregiver;Supervision/safety Proper positioning of operated UE when showering: Supervision/safety;Caregiver independent with task;Patient able to independently direct caregiver Positioning of UE while sleeping: Supervision/safety;Caregiver independent with task;Patient able to independently direct caregiver    Home Living Family/patient expects to be discharged to:: Private residence Living Arrangements: Spouse/significant other Available Help at Discharge: Family;Available 24 hours/day Type of Home: House Home Access: Stairs to enter [employer] of Steps: 2   Home Layout: One level     Bathroom Shower/Tub: Tub/shower unit;Walk-in shower   Bathroom Toilet: Handicapped height Bathroom Accessibility: No   Home Equipment: Shower seat          Prior Functioning/Environment Level of Independence: Independent                 OT Problem List: Decreased range of motion;Pain;Decreased knowledge of use of DME or AE;Impaired UE functional use      OT Treatment/Interventions:      OT Goals(Current goals can be found in the care plan section) Acute Rehab OT Goals Patient Stated Goal: go home OT Goal Formulation: With patient/family  OT Frequency:     Barriers to D/C:    no barriers, will have 24/7 assist from wife                     End of Session Equipment Utilized During Treatment: Other (comment) (R UE sling)  Activity Tolerance: Patient tolerated treatment well Patient left: in chair;with call bell/phone within reach;with family/visitor present  OT Visit Diagnosis: Pain                ADL either performed or assessed with clinical judgement  Time: 0926-1019 OT Time Calculation (min): 53 min Charges:  OT General Charges $OT Visit: 1 Procedure OT Evaluation $OT Eval Low Complexity: 1 Procedure OT Treatments $Self Care/Home Management : 8-22 mins $Therapeutic  Activity: 8-22 mins $Therapeutic Exercise: 8-22 mins G-Codes: OT G-codes **NOT FOR INPATIENT CLASS** Functional Assessment Tool Used: AM-PAC 6 Clicks Daily Activity     Britt Bottom 07/25/2016, 11:29 AM

## 2016-07-26 NOTE — Progress Notes (Signed)
Discharge instructions and prescriptions reviewed with patient and wife. Both stated understanding. IV removed. Wife at bedside for transportation

## 2016-07-26 NOTE — Progress Notes (Signed)
Patient ID: Theodore Patton, male   DOB: 1945-10-15, 71 y.o.   MRN: [id number]     Subjective:  Patient reports pain as mild.  Patient reports improvement through the night and feeling much better.  No nausea.  Objective:   VITALS:   Vitals:   07/25/16 0643 07/25/16 1500 07/25/16 2037 07/26/16 0425  BP: 116/62 124/60 127/65 112/65  Pulse: 75 73 89 72  Resp: 16 16 16 16   Temp: 98.5 F (36.9 C) 98.4 F (36.9 C) 98.4 F (36.9 C) 98 F (36.7 C)  TempSrc: Oral Oral Oral Oral  SpO2: 92% 94% 91% 91%  Weight:      Height:        ABD soft Sensation intact distally Dorsiflexion/Plantar flexion intact Incision: dressing C/D/I and no drainage Good hand and wrist motion  Lab Results  Component Value Date   WBC 8.3 07/25/2016   HGB 11.2 (L) 07/25/2016   HCT 33.0 (L) 07/25/2016   MCV 96.2 07/25/2016   PLT 129 (L) 07/25/2016   BMET    Component Value Date/Time   NA 135 07/25/2016 0433   K 4.1 07/25/2016 0433   CL 98 (L) 07/25/2016 0433   CO2 27 07/25/2016 0433   GLUCOSE 153 (H) 07/25/2016 0433   BUN 14 07/25/2016 0433   CREATININE 1.01 07/25/2016 0433   CALCIUM 8.5 (L) 07/25/2016 0433   GFRNONAA >60 07/25/2016 0433   GFRAA >60 07/25/2016 0433     Assessment/Plan: 2 Days Post-Op   Principal Problem:   Primary localized osteoarthrosis of right shoulder Active Problems:   S/P shoulder replacement, right   Advance diet Up with therapy DC nasal cannula Sling at all times NWB right upper ext DC home if O2 stable   DOUGLAS PARRY, BRANDON 07/26/2016, 7:01 AM  Discussed and agree with above.  Marchia Bond, MD Cell [phone]

## 2016-07-26 NOTE — Discharge Summary (Signed)
Physician Discharge Summary  Patient ID: NOMAR DEVENNY MRN: [id number] DOB/AGE: Oct 18, 1945 71 y.o.  Admit date: 07/24/2016 Discharge date: 07/26/2016  Admission Diagnoses:  Primary localized osteoarthrosis of right shoulder  Discharge Diagnoses:  Principal Problem:   Primary localized osteoarthrosis of right shoulder Active Problems:   S/P shoulder replacement, right   Past Medical History:  Diagnosis Date  . Arthritis   . GERD (gastroesophageal reflux disease)   . Primary localized osteoarthrosis of right shoulder 07/24/2016    Surgeries: Procedure(s): TOTAL SHOULDER ARTHROPLASTY on 07/24/2016   Consultants (if any):   Discharged Condition: Improved  Hospital Course: SHAQUAN KABLE is an 71 y.o. male who was admitted 07/24/2016 with a diagnosis of Primary localized osteoarthrosis of right shoulder and went to the operating room on 07/24/2016 and underwent the above named procedures.    He was given perioperative antibiotics:  Anti-infectives    Start     Dose/Rate Route Frequency Ordered Stop   07/24/16 1400  ceFAZolin (ANCEF) IVPB 1 g/50 mL premix     1 g 100 mL/hr over 30 Minutes Intravenous Every 6 hours 07/24/16 1213 07/25/16 0230   07/24/16 0543  ceFAZolin (ANCEF) IVPB 2g/100 mL premix     2 g 200 mL/hr over 30 Minutes Intravenous On call to O.R. 07/24/16 0543 07/24/16 0745    .  He was given sequential compression devices, early ambulation, for DVT prophylaxis.  He benefited maximally from the hospital stay and there were no complications.    Recent vital signs:  Vitals:   07/25/16 2037 07/26/16 0425  BP: 127/65 112/65  Pulse: 89 72  Resp: 16 16  Temp: 98.4 F (36.9 C) 98 F (36.7 C)    Recent laboratory studies:  Lab Results  Component Value Date   HGB 11.2 (L) 07/25/2016   HGB 13.8 07/12/2016   Lab Results  Component Value Date   WBC 8.3 07/25/2016   PLT 129 (L) 07/25/2016   No results found for: INR Lab Results  Component Value Date   NA  135 07/25/2016   K 4.1 07/25/2016   CL 98 (L) 07/25/2016   CO2 27 07/25/2016   BUN 14 07/25/2016   CREATININE 1.01 07/25/2016   GLUCOSE 153 (H) 07/25/2016    Discharge Medications:   Allergies as of 07/26/2016      Reactions   Sulfa Antibiotics Other (See Comments)   Blisters in mouth      Medication List    STOP taking these medications   acetaminophen 500 MG tablet Commonly known as:  TYLENOL   celecoxib 200 MG capsule Commonly known as:  CELEBREX     TAKE these medications   allopurinol 300 MG tablet Commonly known as:  ZYLOPRIM Take 300 mg by mouth daily.   aspirin EC 81 MG tablet Take 81 mg by mouth at bedtime.   baclofen 10 MG tablet Commonly known as:  LIORESAL Take 1 tablet (10 mg total) by mouth 3 (three) times daily. As needed for muscle spasm   buPROPion 300 MG 24 hr tablet Commonly known as:  WELLBUTRIN XL Take 300 mg by mouth daily.   COSAMIN DS PO Take 2 tablets by mouth 2 (two) times daily.   Fish Oil 1200 MG Caps Take 1,200 mg by mouth 2 (two) times daily.   fluticasone 50 MCG/ACT nasal spray Commonly known as:  FLONASE Place 1-2 sprays into both nostrils at bedtime as needed. For allergies/nasal congestion   gabapentin 300 MG capsule Commonly known as:  NEURONTIN Take 600 mg by mouth 3 (three) times daily.   loratadine 10 MG tablet Commonly known as:  CLARITIN Take 10 mg by mouth at bedtime.   multivitamin with minerals Tabs tablet Take 1 tablet by mouth daily. Complete Multivitamin   ondansetron 4 MG tablet Commonly known as:  ZOFRAN Take 1 tablet (4 mg total) by mouth every 8 (eight) hours as needed for nausea or vomiting.   oxyCODONE-acetaminophen 10-325 MG tablet Commonly known as:  PERCOCET Take 1-2 tablets by mouth every 6 (six) hours as needed for pain. MAXIMUM TOTAL ACETAMINOPHEN DOSE IS 4000 MG PER DAY   sennosides-docusate sodium 8.6-50 MG tablet Commonly known as:  SENOKOT-S Take 2 tablets by mouth daily.    tamsulosin 0.4 MG Caps capsule Commonly known as:  FLOMAX Take 0.4 mg by mouth at bedtime.   VENTOLIN HFA 108 (90 Base) MCG/ACT inhaler Generic drug:  albuterol Inhale 1-2 puffs into the lungs every 6 (six) hours as needed. For wheezing/shortness of breath/asthma       Diagnostic Studies: Ct Shoulder Right Wo Contrast  Result Date: 07/24/2016 CLINICAL DATA:  Postop right shoulder arthroplasty today. EXAM: CT OF THE UPPER RIGHT EXTREMITY WITHOUT CONTRAST TECHNIQUE: Multidetector CT imaging of the right shoulder was performed according to the standard protocol. COMPARISON:  Portable radiograph earlier the same date. CT 06/18/2016. FINDINGS: Bones/Joint/Cartilage Interval right total shoulder arthroplasty. The humeral component is well seated. There is a metallic anchor screw anteriorly in the glenoid. There are 2 radiolucent defects more inferiorly in the glenoid. Previously demonstrated glenohumeral osteophytes have been debrided. No definite intra-articular loose bodies are seen. Probable previous distal clavicle resection. No acute osseous findings are seen. Ligaments Not relevant for exam/indication. Muscles and Tendons There is multifocal soft tissue emphysema surrounding the shoulder, involving the deltoid, rotator cuff and biceps musculature. No focal muscular atrophy seen. There appears to be some residual spurring along the bicipital groove, partly obscured by beam hardening artifact. Soft tissues Postsurgical changes surrounding the shoulder with subcutaneous edema and soft tissue emphysema. No focal hematoma. There is increased atelectasis at the right lung base. No chest wall abnormalities are seen. IMPRESSION: 1. Interval right total shoulder arthroplasty without demonstrated complication. 2. Interval osteophyte debridement.  No residual loose bodies seen. 3. Soft tissue emphysema surrounding the shoulder without focal fluid collection. Electronically Signed   By: Richardean Sale M.D.   On:  07/24/2016 17:18   Dg Shoulder Right Port  Result Date: 07/24/2016 CLINICAL DATA:  Postop right shoulder EXAM: PORTABLE RIGHT SHOULDER COMPARISON:  CT right shoulder dated 06/18/2016 FINDINGS: Right shoulder arthroplasty, in satisfactory position. Widening of the acromioclavicular joint. Right lung is clear. IMPRESSION: Right shoulder arthroplasty, in satisfactory position. Electronically Signed   By: Julian Hy M.D.   On: 07/24/2016 13:04    Disposition: Final discharge disposition not confirmed    Follow-up Information    Tilden Broz P, MD. Schedule an appointment as soon as possible for a visit in 2 weeks.   Specialty:  Orthopedic Surgery Contact information: [address] [address] [phone]            Signed: Johnny Bridge 07/26/2016, 10:29 AM

## 2016-08-03 DIAGNOSIS — M19011 Primary osteoarthritis, right shoulder: Secondary | ICD-10-CM | POA: Diagnosis not present

## 2016-08-29 DIAGNOSIS — M1711 Unilateral primary osteoarthritis, right knee: Secondary | ICD-10-CM | POA: Diagnosis not present

## 2016-08-29 DIAGNOSIS — M19011 Primary osteoarthritis, right shoulder: Secondary | ICD-10-CM | POA: Diagnosis not present

## 2016-09-03 DIAGNOSIS — M25511 Pain in right shoulder: Secondary | ICD-10-CM | POA: Diagnosis not present

## 2016-09-05 DIAGNOSIS — M1711 Unilateral primary osteoarthritis, right knee: Secondary | ICD-10-CM | POA: Diagnosis not present

## 2016-09-05 DIAGNOSIS — M25511 Pain in right shoulder: Secondary | ICD-10-CM | POA: Diagnosis not present

## 2016-09-07 DIAGNOSIS — M25511 Pain in right shoulder: Secondary | ICD-10-CM | POA: Diagnosis not present

## 2016-09-10 DIAGNOSIS — M25511 Pain in right shoulder: Secondary | ICD-10-CM | POA: Diagnosis not present

## 2016-09-12 DIAGNOSIS — M1711 Unilateral primary osteoarthritis, right knee: Secondary | ICD-10-CM | POA: Diagnosis not present

## 2016-09-12 DIAGNOSIS — M25511 Pain in right shoulder: Secondary | ICD-10-CM | POA: Diagnosis not present

## 2016-09-14 DIAGNOSIS — M25511 Pain in right shoulder: Secondary | ICD-10-CM | POA: Diagnosis not present

## 2016-09-17 DIAGNOSIS — M25511 Pain in right shoulder: Secondary | ICD-10-CM | POA: Diagnosis not present

## 2016-09-19 DIAGNOSIS — M25511 Pain in right shoulder: Secondary | ICD-10-CM | POA: Diagnosis not present

## 2016-09-21 DIAGNOSIS — M25511 Pain in right shoulder: Secondary | ICD-10-CM | POA: Diagnosis not present

## 2016-09-24 DIAGNOSIS — M25511 Pain in right shoulder: Secondary | ICD-10-CM | POA: Diagnosis not present

## 2016-09-26 DIAGNOSIS — M25511 Pain in right shoulder: Secondary | ICD-10-CM | POA: Diagnosis not present

## 2016-09-26 DIAGNOSIS — M19011 Primary osteoarthritis, right shoulder: Secondary | ICD-10-CM | POA: Diagnosis not present

## 2016-09-27 DIAGNOSIS — M25511 Pain in right shoulder: Secondary | ICD-10-CM | POA: Diagnosis not present

## 2016-10-01 DIAGNOSIS — M25511 Pain in right shoulder: Secondary | ICD-10-CM | POA: Diagnosis not present

## 2016-10-03 DIAGNOSIS — M25511 Pain in right shoulder: Secondary | ICD-10-CM | POA: Diagnosis not present

## 2016-10-05 DIAGNOSIS — M25511 Pain in right shoulder: Secondary | ICD-10-CM | POA: Diagnosis not present

## 2016-10-15 DIAGNOSIS — M25511 Pain in right shoulder: Secondary | ICD-10-CM | POA: Diagnosis not present

## 2016-10-17 DIAGNOSIS — M25511 Pain in right shoulder: Secondary | ICD-10-CM | POA: Diagnosis not present

## 2016-10-17 DIAGNOSIS — M19011 Primary osteoarthritis, right shoulder: Secondary | ICD-10-CM | POA: Diagnosis not present

## 2016-10-17 DIAGNOSIS — M1711 Unilateral primary osteoarthritis, right knee: Secondary | ICD-10-CM | POA: Diagnosis not present

## 2016-10-19 DIAGNOSIS — M25511 Pain in right shoulder: Secondary | ICD-10-CM | POA: Diagnosis not present

## 2016-10-22 DIAGNOSIS — M25511 Pain in right shoulder: Secondary | ICD-10-CM | POA: Diagnosis not present

## 2016-10-24 DIAGNOSIS — M25511 Pain in right shoulder: Secondary | ICD-10-CM | POA: Diagnosis not present

## 2016-11-07 DIAGNOSIS — M25511 Pain in right shoulder: Secondary | ICD-10-CM | POA: Diagnosis not present

## 2016-11-21 DIAGNOSIS — M25511 Pain in right shoulder: Secondary | ICD-10-CM | POA: Diagnosis not present

## 2016-11-28 DIAGNOSIS — M25511 Pain in right shoulder: Secondary | ICD-10-CM | POA: Diagnosis not present

## 2016-12-17 ENCOUNTER — Emergency Department (HOSPITAL_COMMUNITY)
Admission: EM | Admit: 2016-12-17 | Discharge: 2016-12-17 | Disposition: A | Discharge status: Home / Self Care | Payer: Medicare Other | Attending: Emergency Medicine | Admitting: Emergency Medicine

## 2016-12-17 ENCOUNTER — Emergency Department (HOSPITAL_COMMUNITY): Payer: Medicare Other

## 2016-12-17 ENCOUNTER — Encounter (HOSPITAL_COMMUNITY): Payer: Self-pay | Admitting: *Deleted

## 2016-12-17 DIAGNOSIS — R079 Chest pain, unspecified: Secondary | ICD-10-CM | POA: Diagnosis not present

## 2016-12-17 DIAGNOSIS — Z96611 Presence of right artificial shoulder joint: Secondary | ICD-10-CM | POA: Insufficient documentation

## 2016-12-17 DIAGNOSIS — K2100 Gastro-esophageal reflux disease with esophagitis, without bleeding: Secondary | ICD-10-CM

## 2016-12-17 DIAGNOSIS — Z87891 Personal history of nicotine dependence: Secondary | ICD-10-CM | POA: Diagnosis not present

## 2016-12-17 DIAGNOSIS — Z79899 Other long term (current) drug therapy: Secondary | ICD-10-CM | POA: Diagnosis not present

## 2016-12-17 DIAGNOSIS — Z7982 Long term (current) use of aspirin: Secondary | ICD-10-CM | POA: Diagnosis not present

## 2016-12-17 LAB — LIPASE, BLOOD: Lipase: 31 U/L (ref 11–51)

## 2016-12-17 LAB — HEPATIC FUNCTION PANEL
ALT: 18 U/L (ref 17–63)
AST: 23 U/L (ref 15–41)
Albumin: 4.4 g/dL (ref 3.5–5.0)
Alkaline Phosphatase: 54 U/L (ref 38–126)
Bilirubin, Direct: 0.2 mg/dL (ref 0.1–0.5)
Indirect Bilirubin: 0.6 mg/dL (ref 0.3–0.9)
Total Bilirubin: 0.8 mg/dL (ref 0.3–1.2)
Total Protein: 7.1 g/dL (ref 6.5–8.1)

## 2016-12-17 LAB — CBC
HCT: 44.3 % (ref 39.0–52.0)
Hemoglobin: 15.2 g/dL (ref 13.0–17.0)
MCH: 32.3 pg (ref 26.0–34.0)
MCHC: 34.3 g/dL (ref 30.0–36.0)
MCV: 94.1 fL (ref 78.0–100.0)
Platelets: 135 10*3/uL — ABNORMAL LOW (ref 150–400)
RBC: 4.71 MIL/uL (ref 4.22–5.81)
RDW: 14.4 % (ref 11.5–15.5)
WBC: 5.6 10*3/uL (ref 4.0–10.5)

## 2016-12-17 LAB — BASIC METABOLIC PANEL
Anion gap: 11 (ref 5–15)
BUN: 24 mg/dL — ABNORMAL HIGH (ref 6–20)
CO2: 22 mmol/L (ref 22–32)
Calcium: 8.9 mg/dL (ref 8.9–10.3)
Chloride: 105 mmol/L (ref 101–111)
Creatinine, Ser: 1.15 mg/dL (ref 0.61–1.24)
GFR calc Af Amer: >60 mL/min (ref >60)
GFR calc non Af Amer: >60 mL/min (ref >60)
Glucose, Bld: 120 mg/dL — ABNORMAL HIGH (ref 65–99)
Potassium: 3.8 mmol/L (ref 3.5–5.1)
Sodium: 138 mmol/L (ref 135–145)

## 2016-12-17 LAB — I-STAT TROPONIN, ED: Troponin i, poc: 0 ng/mL (ref 0.00–0.08)

## 2016-12-17 MED ORDER — SUCRALFATE 1 GM/10ML PO SUSP: 1.0000 g | Freq: Three times a day (TID) | ORAL | 0 refills | Status: DC

## 2016-12-17 MED ORDER — SODIUM CHLORIDE 0.9 % IV BOLUS (SEPSIS)
1000.0000 mL | Freq: Once | INTRAVENOUS | Status: AC
  Administered 2016-12-17: 1000 mL via INTRAVENOUS

## 2016-12-17 MED ORDER — FAMOTIDINE IN NACL 20-0.9 MG/50ML-% IV SOLN
20.0000 mg | Freq: Once | INTRAVENOUS | Status: AC
  Administered 2016-12-17: 20 mg via INTRAVENOUS
  Filled 2016-12-17: qty 50

## 2016-12-17 MED ORDER — ONDANSETRON 4 MG PO TBDP: 4.0000 mg | ORAL_TABLET | Freq: Three times a day (TID) | ORAL | 0 refills | Status: DC | PRN

## 2016-12-17 MED ORDER — GI COCKTAIL ~~LOC~~
30.0000 mL | Freq: Once | ORAL | Status: AC
  Administered 2016-12-17: 30 mL via ORAL
  Filled 2016-12-17: qty 30

## 2016-12-17 MED ORDER — PANTOPRAZOLE SODIUM 40 MG IV SOLR
40.0000 mg | Freq: Once | INTRAVENOUS | Status: AC
  Administered 2016-12-17: 40 mg via INTRAVENOUS
  Filled 2016-12-17: qty 40

## 2016-12-17 MED ORDER — OMEPRAZOLE 20 MG PO CPDR: 20.0000 mg | DELAYED_RELEASE_CAPSULE | Freq: Every day | ORAL | 0 refills | Status: DC

## 2016-12-17 NOTE — ED Notes (Signed)
ED Provider at bedside. 

## 2016-12-17 NOTE — ED Notes (Signed)
Pt returned to room from xray.

## 2016-12-17 NOTE — ED Triage Notes (Signed)
PT has been tx himself for GERD (burning to center of chest) since last night, with no relief.  States some sob

## 2016-12-17 NOTE — ED Notes (Signed)
Patient ambulating to BR, tolerating well.

## 2016-12-17 NOTE — ED Notes (Signed)
Patient transported to X-ray 

## 2016-12-17 NOTE — ED Provider Notes (Signed)
[facility] DEPT Provider Note   CSN: [id number] Arrival date & time: 12/17/16  8588     History   Chief Complaint Chief Complaint  Patient presents with  . Chest Pain    HPI Theodore Patton is a 71 y.o. male.  HPI   71 year old male with history of GERD who presents with epigastric chest pain. The patient states that his symptoms started last night. He ate hamburgers as well as pizza at a cookout. When he returned home, he developed gradual onset of aching, burning, substernal and epigastric chest pressure. This chest pressure persisted throughout the night with some mild, occasional shortness of breath when he felt like it radiated up towards his throat. He denies any associated shortness of breath or diaphoresis. Denies any vomiting but did have some nausea. Of note, the burgers that he ate workout at a different location and there is one other member at the party who has similar symptoms. He has also had some loose stools throughout the morning today. No fevers or chills  Past Medical History:  Diagnosis Date  . Arthritis   . GERD (gastroesophageal reflux disease)   . Primary localized osteoarthrosis of right shoulder 07/24/2016    Patient Active Problem List   Diagnosis Date Noted  . 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 PILONIDAL CYST EXCISION    . SHOULDER ARTHROSCOPY Right 2008  . TOTAL SHOULDER ARTHROPLASTY Right 07/24/2016   Procedure: TOTAL SHOULDER ARTHROPLASTY;  Surgeon: Marchia Bond, MD;  Location: [facility];  Service: Orthopedics;  Laterality: Right;       Home Medications    Prior to Admission medications   Medication Sig Start Date End Date Taking? Authorizing Provider  acetaminophen (TYLENOL) 500 MG tablet Take 1,000 mg by mouth 2 (two) times daily.   Yes [provider]  allopurinol (ZYLOPRIM) 300 MG tablet Take 300 mg  by mouth daily. 05/16/16  Yes [provider]  aspirin EC 81 MG tablet Take 81 mg by mouth at bedtime.   Yes [provider]  buPROPion (WELLBUTRIN XL) 300 MG 24 hr tablet Take 300 mg by mouth daily. 06/15/16  Yes [provider]  celecoxib (CELEBREX) 200 MG capsule Take 200 mg by mouth daily.   Yes [provider]  fluticasone (FLONASE) 50 MCG/ACT nasal spray Place 1-2 sprays into both nostrils at bedtime as needed. For allergies/nasal congestion 06/11/16  Yes [provider]  gabapentin (NEURONTIN) 300 MG capsule Take 600 mg by mouth 3 (three) times daily. 04/03/16  Yes [provider]  Glucosamine-Chondroitin (COSAMIN DS PO) Take 2 tablets by mouth 2 (two) times daily.   Yes [provider]  loratadine (CLARITIN) 10 MG tablet Take 10 mg by mouth at bedtime.   Yes [provider]  Multiple Vitamin (MULTIVITAMIN WITH MINERALS) TABS tablet Take 1 tablet by mouth daily. Complete Multivitamin   Yes [provider]  Omega-3 Fatty Acids (FISH OIL) 1200 MG CAPS Take 1,200 mg by mouth 2 (two) times daily.   Yes [provider]  tamsulosin (FLOMAX) 0.4 MG CAPS capsule Take 0.4 mg by mouth at bedtime. 05/16/16  Yes [provider]  VENTOLIN HFA 108 (90 Base) MCG/ACT inhaler Inhale 1-2 puffs into the lungs every 6 (six) hours as needed. For wheezing/shortness of breath/asthma 05/25/16  Yes [provider]  omeprazole (PRILOSEC) 20 MG capsule Take 1 capsule (20  mg total) by mouth daily. 12/17/16 12/27/16  Duffy Bruce, MD  ondansetron (ZOFRAN ODT) 4 MG disintegrating tablet Take 1 tablet (4 mg total) by mouth every 8 (eight) hours as needed for nausea or vomiting. 12/17/16   Duffy Bruce, MD  sucralfate (CARAFATE) 1 GM/10ML suspension Take 10 mLs (1 g total) by mouth 4 (four) times daily -  with meals and at bedtime. 12/17/16   Duffy Bruce, MD    Family History No family history on file.  Social  History Social History  Substance Use Topics  . Smoking status: Former Smoker    Types: Cigarettes    Quit date: 07/12/1980  . Smokeless tobacco: Never Used  . Alcohol use 0.6 oz/week    1 Cans of beer per week     Allergies   Sulfa antibiotics   Review of Systems Review of Systems  Constitutional: Positive for fatigue.  Cardiovascular: Positive for chest pain.  Gastrointestinal: Positive for abdominal pain and nausea.  All other systems reviewed and are negative.    Physical Exam Updated Vital Signs BP 118/71   Pulse 91   Temp 98.7 F (37.1 C) (Oral)   Resp (!) 23   Ht 6\' 1"  (1.854 m)   Wt 94.3 kg (208 lb)   SpO2 96%   BMI 27.44 kg/m   Physical Exam  Constitutional: He is oriented to person, place, and time. He appears well-developed and well-nourished. No distress.  HENT:  Head: Normocephalic and atraumatic.  Eyes: Conjunctivae are normal.  Neck: Neck supple.  Cardiovascular: Normal rate, regular rhythm and normal heart sounds.  Exam reveals no friction rub.   No murmur heard. Pulmonary/Chest: Effort normal and breath sounds normal. No respiratory distress. He has no wheezes. He has no rales.  Abdominal: Soft. He exhibits no distension. There is tenderness (mild, epigastric).  Musculoskeletal: He exhibits no edema.  Neurological: He is alert and oriented to person, place, and time. He exhibits normal muscle tone.  Skin: Skin is warm. Capillary refill takes less than 2 seconds.  Psychiatric: He has a normal mood and affect.  Nursing note and vitals reviewed.    ED Treatments / Results  Labs (all labs ordered are listed, but only abnormal results are displayed) Labs Reviewed  BASIC METABOLIC PANEL - Abnormal; Notable for the following:       Result Value   Glucose, Bld 120 (*)    BUN 24 (*)    All other components within normal limits  CBC - Abnormal; Notable for the following:    Platelets 135 (*)    All other components within normal limits  HEPATIC  FUNCTION PANEL  LIPASE, BLOOD  I-STAT TROPOININ, ED    EKG  EKG Interpretation  Date/Time:  Monday December 17 2016 08:39:39 EDT Ventricular Rate:  103 PR Interval:  154 QRS Duration: 80 QT Interval:  328 QTC Calculation: 429 R Axis:   53 Text Interpretation:  Sinus tachycardia Otherwise normal ECG No significant change since last tracing Confirmed by Duffy Bruce [phone]) on 12/17/2016 8:44:44 AM Also confirmed by Duffy Bruce [phone]), editor Drema Pry [phone])  on 12/17/2016 8:54:04 AM       Radiology Dg Chest 2 View  Result Date: 12/17/2016 CLINICAL DATA:  70 year old male with chest pain and shortness of breath since last night, progressive. EXAM: CHEST  2 VIEW COMPARISON:  Right shoulder CT 07/24/2016 FINDINGS: Lung volumes are within normal limits. Calcified aortic atherosclerosis. Other mediastinal contours are within normal limits. Visualized tracheal air column  is within normal limits. No pneumothorax or pulmonary edema. No pleural effusion or confluent pulmonary opacity. Scoliosis and degenerative changes in the spine. Right shoulder arthroplasty. No acute osseous abnormality identified. Visible bowel gas pattern is within normal limits. IMPRESSION: 1.  No acute cardiopulmonary abnormality. 2.  Calcified aortic atherosclerosis. Electronically Signed   By: Genevie Ann M.D.   On: 12/17/2016 09:18    Procedures Procedures (including critical care time)  Medications Ordered in ED Medications  gi cocktail (Maalox,Lidocaine,Donnatal) (30 mLs Oral Given 12/17/16 0930)  pantoprazole (PROTONIX) injection 40 mg (40 mg Intravenous Given 12/17/16 0930)  famotidine (PEPCID) IVPB 20 mg premix (0 mg Intravenous Stopped 12/17/16 1241)  sodium chloride 0.9 % bolus 1,000 mL (0 mLs Intravenous Stopped 12/17/16 1253)     Initial Impression / Assessment and Plan / ED Course  I have reviewed the triage vital signs and the nursing notes.  Pertinent labs & imaging results that were  available during my care of the patient were reviewed by me and considered in my medical decision making (see chart for details).    72 year old male with past medical history as above who presents with atypical epigastric pain after eating at a cookout yesterday, with loose stools today. Patient has known sick contacts versus food borne illness contacts at the same cookout. I suspect his symptoms are secondary to gastritis versus food borne illness. His pain is highly atypical with reflux esophagitis and is completely resolved with GI cocktail here. His EKG is nonischemic, he has no known coronary disease, and troponin is undetectable despite constant, unchanged symptoms for more than 12 hours. I do not suspect ACS. Do not suspect PE or dissection. Otherwise, LFTs and lipase are normal and he has no evidence to suggest acute cholecystitis clinically or on exam. He was given fluids and is now tolerating by mouth without difficulty. No focal abdominal tenderness to suggest diverticulitis or other intra-abdominal emergency. Will place on antacids, give antiemetics, and discharge with outpatient follow-up.  This note was prepared with assistance of Systems analyst. Occasional wrong-word or sound-a-like substitutions may have occurred due to the inherent limitations of voice recognition software.   Final Clinical Impressions(s) / ED Diagnoses   Final diagnoses:  Gastroesophageal reflux disease with esophagitis    New Prescriptions Discharge Medication List as of 12/17/2016 12:35 PM    START taking these medications   Details  omeprazole (PRILOSEC) 20 MG capsule Take 1 capsule (20 mg total) by mouth daily., Starting Mon 12/17/2016, Until Thu 12/27/2016, Print    ondansetron (ZOFRAN ODT) 4 MG disintegrating tablet Take 1 tablet (4 mg total) by mouth every 8 (eight) hours as needed for nausea or vomiting., Starting Mon 12/17/2016, Print    sucralfate (CARAFATE) 1 GM/10ML suspension Take  10 mLs (1 g total) by mouth 4 (four) times daily -  with meals and at bedtime., Starting Mon 12/17/2016, Print         Duffy Bruce, MD 12/17/16 [phone]

## 2016-12-18 DIAGNOSIS — R110 Nausea: Secondary | ICD-10-CM | POA: Diagnosis not present

## 2016-12-18 DIAGNOSIS — K219 Gastro-esophageal reflux disease without esophagitis: Secondary | ICD-10-CM | POA: Diagnosis not present

## 2016-12-18 DIAGNOSIS — R079 Chest pain, unspecified: Secondary | ICD-10-CM | POA: Diagnosis not present

## 2016-12-21 DIAGNOSIS — K2901 Acute gastritis with bleeding: Secondary | ICD-10-CM | POA: Diagnosis not present

## 2016-12-21 DIAGNOSIS — K219 Gastro-esophageal reflux disease without esophagitis: Secondary | ICD-10-CM | POA: Diagnosis not present

## 2016-12-21 DIAGNOSIS — K2970 Gastritis, unspecified, without bleeding: Secondary | ICD-10-CM | POA: Diagnosis not present

## 2016-12-21 DIAGNOSIS — R110 Nausea: Secondary | ICD-10-CM | POA: Diagnosis not present

## 2016-12-21 DIAGNOSIS — R079 Chest pain, unspecified: Secondary | ICD-10-CM | POA: Diagnosis not present

## 2017-01-08 DIAGNOSIS — K219 Gastro-esophageal reflux disease without esophagitis: Secondary | ICD-10-CM | POA: Diagnosis not present

## 2017-01-08 DIAGNOSIS — K2901 Acute gastritis with bleeding: Secondary | ICD-10-CM | POA: Diagnosis not present

## 2017-01-21 DIAGNOSIS — R7302 Impaired glucose tolerance (oral): Secondary | ICD-10-CM | POA: Diagnosis not present

## 2017-01-22 DIAGNOSIS — M25511 Pain in right shoulder: Secondary | ICD-10-CM | POA: Diagnosis not present

## 2017-01-22 DIAGNOSIS — D696 Thrombocytopenia, unspecified: Secondary | ICD-10-CM | POA: Diagnosis not present

## 2017-01-22 DIAGNOSIS — F39 Unspecified mood [affective] disorder: Secondary | ICD-10-CM | POA: Diagnosis not present

## 2017-01-22 DIAGNOSIS — Z6828 Body mass index (BMI) 28.0-28.9, adult: Secondary | ICD-10-CM | POA: Diagnosis not present

## 2017-01-22 DIAGNOSIS — R7302 Impaired glucose tolerance (oral): Secondary | ICD-10-CM | POA: Diagnosis not present

## 2017-01-22 DIAGNOSIS — K2900 Acute gastritis without bleeding: Secondary | ICD-10-CM | POA: Diagnosis not present

## 2017-01-24 DIAGNOSIS — D225 Melanocytic nevi of trunk: Secondary | ICD-10-CM | POA: Diagnosis not present

## 2017-01-24 DIAGNOSIS — L821 Other seborrheic keratosis: Secondary | ICD-10-CM | POA: Diagnosis not present

## 2017-01-24 DIAGNOSIS — C44619 Basal cell carcinoma of skin of left upper limb, including shoulder: Secondary | ICD-10-CM | POA: Diagnosis not present

## 2017-01-24 DIAGNOSIS — D485 Neoplasm of uncertain behavior of skin: Secondary | ICD-10-CM | POA: Diagnosis not present

## 2017-01-24 DIAGNOSIS — L570 Actinic keratosis: Secondary | ICD-10-CM | POA: Diagnosis not present

## 2017-02-28 DIAGNOSIS — K219 Gastro-esophageal reflux disease without esophagitis: Secondary | ICD-10-CM | POA: Diagnosis not present

## 2017-02-28 DIAGNOSIS — Z1211 Encounter for screening for malignant neoplasm of colon: Secondary | ICD-10-CM | POA: Diagnosis not present

## 2017-02-28 DIAGNOSIS — Z86010 Personal history of colonic polyps: Secondary | ICD-10-CM | POA: Diagnosis not present

## 2017-03-04 DIAGNOSIS — M25511 Pain in right shoulder: Secondary | ICD-10-CM | POA: Diagnosis not present

## 2017-03-04 DIAGNOSIS — M19012 Primary osteoarthritis, left shoulder: Secondary | ICD-10-CM | POA: Diagnosis not present

## 2017-03-04 DIAGNOSIS — M1711 Unilateral primary osteoarthritis, right knee: Secondary | ICD-10-CM | POA: Diagnosis not present

## 2017-03-04 DIAGNOSIS — M19011 Primary osteoarthritis, right shoulder: Secondary | ICD-10-CM | POA: Diagnosis not present

## 2017-03-16 DIAGNOSIS — Z23 Encounter for immunization: Secondary | ICD-10-CM | POA: Diagnosis not present

## 2017-03-18 DIAGNOSIS — M542 Cervicalgia: Secondary | ICD-10-CM | POA: Diagnosis not present

## 2017-03-21 DIAGNOSIS — M542 Cervicalgia: Secondary | ICD-10-CM | POA: Diagnosis not present

## 2017-03-27 DIAGNOSIS — M19012 Primary osteoarthritis, left shoulder: Secondary | ICD-10-CM | POA: Diagnosis not present

## 2017-03-29 DIAGNOSIS — M542 Cervicalgia: Secondary | ICD-10-CM | POA: Diagnosis not present

## 2017-04-19 DIAGNOSIS — D122 Benign neoplasm of ascending colon: Secondary | ICD-10-CM | POA: Diagnosis not present

## 2017-04-19 DIAGNOSIS — K635 Polyp of colon: Secondary | ICD-10-CM | POA: Diagnosis not present

## 2017-04-19 DIAGNOSIS — Z86010 Personal history of colonic polyps: Secondary | ICD-10-CM | POA: Diagnosis not present

## 2017-04-19 DIAGNOSIS — Z1211 Encounter for screening for malignant neoplasm of colon: Secondary | ICD-10-CM | POA: Diagnosis not present

## 2017-04-19 DIAGNOSIS — K5730 Diverticulosis of large intestine without perforation or abscess without bleeding: Secondary | ICD-10-CM | POA: Diagnosis not present

## 2017-04-30 DIAGNOSIS — Z86010 Personal history of colonic polyps: Secondary | ICD-10-CM | POA: Diagnosis not present

## 2017-04-30 DIAGNOSIS — K219 Gastro-esophageal reflux disease without esophagitis: Secondary | ICD-10-CM | POA: Diagnosis not present

## 2017-05-02 DIAGNOSIS — K625 Hemorrhage of anus and rectum: Secondary | ICD-10-CM | POA: Diagnosis not present

## 2017-05-04 ENCOUNTER — Telehealth: Payer: Self-pay | Admitting: Gastroenterology

## 2017-05-04 NOTE — Telephone Encounter (Signed)
Patient's wife is calling to report small amounts of dark red blood per rectum with bowel movements.  Patient's wife relates the following history.  Patient underwent colonoscopy with removal of a large polyp by Dr. Collene Mares approximately 2 weeks ago.  He has had intermittent problems with small amounts of dark bright red blood per rectum with brown bowel movements for the past several days.  He has felt tired but has not had weakness, dizziness, shortness of breath, chest pain, abdominal pain, fevers, chills, nausea or vomiting.  Again today he had small amounts of red blood per rectum with a brown bowel movement after 2 days without any bowel movement.  The patient has been in touch with Dr. Collene Mares over the past several days and apparently blood work was performed at Dr. Lorie Apley office this past week, results unknown.   I offered them evaluation in the ED or to rest at home with no exertion.  Avoid all aspirin and NSAID products.  If bleeding worsens or any above symptoms that he denied today develop he is advised to call back or proceed directly to the emergency department for further evaluation. Advised to contact Dr. Lorie Apley office on Monday with status report.

## 2017-05-30 DIAGNOSIS — M542 Cervicalgia: Secondary | ICD-10-CM | POA: Diagnosis not present

## 2017-05-30 DIAGNOSIS — M5416 Radiculopathy, lumbar region: Secondary | ICD-10-CM | POA: Diagnosis not present

## 2017-05-30 DIAGNOSIS — M47812 Spondylosis without myelopathy or radiculopathy, cervical region: Secondary | ICD-10-CM | POA: Diagnosis not present

## 2017-05-30 DIAGNOSIS — M48062 Spinal stenosis, lumbar region with neurogenic claudication: Secondary | ICD-10-CM | POA: Diagnosis not present

## 2017-06-14 DIAGNOSIS — R030 Elevated blood-pressure reading, without diagnosis of hypertension: Secondary | ICD-10-CM | POA: Diagnosis not present

## 2017-06-14 DIAGNOSIS — M47812 Spondylosis without myelopathy or radiculopathy, cervical region: Secondary | ICD-10-CM | POA: Diagnosis not present

## 2017-08-12 DIAGNOSIS — R7302 Impaired glucose tolerance (oral): Secondary | ICD-10-CM | POA: Diagnosis not present

## 2017-08-12 DIAGNOSIS — Z125 Encounter for screening for malignant neoplasm of prostate: Secondary | ICD-10-CM | POA: Diagnosis not present

## 2017-08-12 DIAGNOSIS — M109 Gout, unspecified: Secondary | ICD-10-CM | POA: Diagnosis not present

## 2017-08-12 DIAGNOSIS — E7849 Other hyperlipidemia: Secondary | ICD-10-CM | POA: Diagnosis not present

## 2017-08-12 DIAGNOSIS — R82998 Other abnormal findings in urine: Secondary | ICD-10-CM | POA: Diagnosis not present

## 2017-08-19 DIAGNOSIS — N401 Enlarged prostate with lower urinary tract symptoms: Secondary | ICD-10-CM | POA: Diagnosis not present

## 2017-08-19 DIAGNOSIS — I358 Other nonrheumatic aortic valve disorders: Secondary | ICD-10-CM | POA: Diagnosis not present

## 2017-08-19 DIAGNOSIS — F39 Unspecified mood [affective] disorder: Secondary | ICD-10-CM | POA: Diagnosis not present

## 2017-08-19 DIAGNOSIS — M109 Gout, unspecified: Secondary | ICD-10-CM | POA: Diagnosis not present

## 2017-08-19 DIAGNOSIS — E7849 Other hyperlipidemia: Secondary | ICD-10-CM | POA: Diagnosis not present

## 2017-08-19 DIAGNOSIS — M25511 Pain in right shoulder: Secondary | ICD-10-CM | POA: Diagnosis not present

## 2017-08-19 DIAGNOSIS — Z0000 Encounter for general adult medical examination without abnormal findings: Secondary | ICD-10-CM | POA: Diagnosis not present

## 2017-08-19 DIAGNOSIS — D696 Thrombocytopenia, unspecified: Secondary | ICD-10-CM | POA: Diagnosis not present

## 2017-08-19 DIAGNOSIS — J3089 Other allergic rhinitis: Secondary | ICD-10-CM | POA: Diagnosis not present

## 2017-08-19 DIAGNOSIS — Z6828 Body mass index (BMI) 28.0-28.9, adult: Secondary | ICD-10-CM | POA: Diagnosis not present

## 2017-08-19 DIAGNOSIS — Z1389 Encounter for screening for other disorder: Secondary | ICD-10-CM | POA: Diagnosis not present

## 2017-08-19 DIAGNOSIS — R7302 Impaired glucose tolerance (oral): Secondary | ICD-10-CM | POA: Diagnosis not present

## 2017-08-22 DIAGNOSIS — Z1212 Encounter for screening for malignant neoplasm of rectum: Secondary | ICD-10-CM | POA: Diagnosis not present

## 2017-10-07 DIAGNOSIS — M19011 Primary osteoarthritis, right shoulder: Secondary | ICD-10-CM | POA: Diagnosis not present

## 2017-10-07 DIAGNOSIS — M19012 Primary osteoarthritis, left shoulder: Secondary | ICD-10-CM | POA: Diagnosis not present

## 2017-11-13 DIAGNOSIS — C44519 Basal cell carcinoma of skin of other part of trunk: Secondary | ICD-10-CM | POA: Diagnosis not present

## 2017-11-13 DIAGNOSIS — L821 Other seborrheic keratosis: Secondary | ICD-10-CM | POA: Diagnosis not present

## 2017-11-13 DIAGNOSIS — D485 Neoplasm of uncertain behavior of skin: Secondary | ICD-10-CM | POA: Diagnosis not present

## 2017-11-13 DIAGNOSIS — D692 Other nonthrombocytopenic purpura: Secondary | ICD-10-CM | POA: Diagnosis not present

## 2017-11-13 DIAGNOSIS — C44311 Basal cell carcinoma of skin of nose: Secondary | ICD-10-CM | POA: Diagnosis not present

## 2017-12-23 DIAGNOSIS — C44311 Basal cell carcinoma of skin of nose: Secondary | ICD-10-CM | POA: Diagnosis not present

## 2017-12-23 DIAGNOSIS — Z85828 Personal history of other malignant neoplasm of skin: Secondary | ICD-10-CM | POA: Diagnosis not present

## 2017-12-24 DIAGNOSIS — M47812 Spondylosis without myelopathy or radiculopathy, cervical region: Secondary | ICD-10-CM | POA: Diagnosis not present

## 2018-01-13 DIAGNOSIS — Z85828 Personal history of other malignant neoplasm of skin: Secondary | ICD-10-CM | POA: Diagnosis not present

## 2018-01-13 DIAGNOSIS — D485 Neoplasm of uncertain behavior of skin: Secondary | ICD-10-CM | POA: Diagnosis not present

## 2018-01-13 DIAGNOSIS — L738 Other specified follicular disorders: Secondary | ICD-10-CM | POA: Diagnosis not present

## 2018-02-11 ENCOUNTER — Other Ambulatory Visit (HOSPITAL_COMMUNITY): Payer: Self-pay | Admitting: Internal Medicine

## 2018-02-11 DIAGNOSIS — I358 Other nonrheumatic aortic valve disorders: Secondary | ICD-10-CM

## 2018-02-11 DIAGNOSIS — M2550 Pain in unspecified joint: Secondary | ICD-10-CM | POA: Diagnosis not present

## 2018-02-11 DIAGNOSIS — D696 Thrombocytopenia, unspecified: Secondary | ICD-10-CM | POA: Diagnosis not present

## 2018-02-11 DIAGNOSIS — E7849 Other hyperlipidemia: Secondary | ICD-10-CM | POA: Diagnosis not present

## 2018-02-11 DIAGNOSIS — R7302 Impaired glucose tolerance (oral): Secondary | ICD-10-CM | POA: Diagnosis not present

## 2018-02-11 DIAGNOSIS — R413 Other amnesia: Secondary | ICD-10-CM | POA: Diagnosis not present

## 2018-02-11 DIAGNOSIS — N401 Enlarged prostate with lower urinary tract symptoms: Secondary | ICD-10-CM | POA: Diagnosis not present

## 2018-02-11 DIAGNOSIS — F39 Unspecified mood [affective] disorder: Secondary | ICD-10-CM | POA: Diagnosis not present

## 2018-02-11 DIAGNOSIS — Z6827 Body mass index (BMI) 27.0-27.9, adult: Secondary | ICD-10-CM | POA: Diagnosis not present

## 2018-02-11 DIAGNOSIS — M25511 Pain in right shoulder: Secondary | ICD-10-CM | POA: Diagnosis not present

## 2018-02-25 ENCOUNTER — Ambulatory Visit (HOSPITAL_COMMUNITY)
Admission: RE | Admit: 2018-02-25 | Discharge: 2018-02-25 | Disposition: A | Discharge status: Home / Self Care | Payer: Medicare Other | Source: Ambulatory Visit | Attending: Internal Medicine | Admitting: Internal Medicine

## 2018-02-25 DIAGNOSIS — I358 Other nonrheumatic aortic valve disorders: Secondary | ICD-10-CM | POA: Diagnosis not present

## 2018-02-25 DIAGNOSIS — I080 Rheumatic disorders of both mitral and aortic valves: Secondary | ICD-10-CM | POA: Diagnosis not present

## 2018-02-25 NOTE — Progress Notes (Signed)
  Echocardiogram 2D Echocardiogram has been performed.  Laquanna Veazey L Androw 02/25/2018, 10:29 AM

## 2018-03-04 ENCOUNTER — Telehealth: Payer: Self-pay | Admitting: Cardiology

## 2018-03-04 NOTE — Telephone Encounter (Signed)
Dr. Meda Coffee would you be able to inquire about this? Not an established pt with [place], but did see where you read his echo for Dr. Dagmar Hait. Please advise!

## 2018-03-04 NOTE — Telephone Encounter (Signed)
Informed the pt of his echo results and recommendations per Dr Meda Coffee.  Advised the pt to touch base with Dr Danna Hefty office and request for them to place a referral in the system for him to have a new pt appt with Cardiology.  Informed the pt that he can see any Cardiologist in our group for this matter.  Would request to see the soonest available Cardiologist accepting new pts, once referral in process. Pt verbalized understanding and agrees with this plan.  Pt will call Dr. Dagmar Hait today, to request referral.

## 2018-03-04 NOTE — Telephone Encounter (Signed)
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

## 2018-03-04 NOTE — Telephone Encounter (Signed)
Follow Up:   Pt had an Echo here on 02-25-18. Dr Dagmar Hait sent the Echo to Dr Meda Coffee to review. Pt is now waiting to hear from Dr Meda Coffee. He thought he was supposed to see Dr Meda Coffee after she reviewed the Echo.               s

## 2018-03-08 DIAGNOSIS — Z23 Encounter for immunization: Secondary | ICD-10-CM | POA: Diagnosis not present

## 2018-04-11 ENCOUNTER — Encounter: Payer: Self-pay | Admitting: Cardiology

## 2018-04-11 ENCOUNTER — Ambulatory Visit (INDEPENDENT_AMBULATORY_CARE_PROVIDER_SITE_OTHER): Payer: Medicare Other | Admitting: Cardiology

## 2018-04-11 VITALS — BP 142/70 | HR 69 | Ht 73.0 in | Wt 207.8 lb

## 2018-04-11 DIAGNOSIS — E7800 Pure hypercholesterolemia, unspecified: Secondary | ICD-10-CM

## 2018-04-11 DIAGNOSIS — Q230 Congenital stenosis of aortic valve: Secondary | ICD-10-CM

## 2018-04-11 DIAGNOSIS — I350 Nonrheumatic aortic (valve) stenosis: Secondary | ICD-10-CM

## 2018-04-11 DIAGNOSIS — I358 Other nonrheumatic aortic valve disorders: Secondary | ICD-10-CM | POA: Diagnosis not present

## 2018-04-11 DIAGNOSIS — I77810 Thoracic aortic ectasia: Secondary | ICD-10-CM | POA: Insufficient documentation

## 2018-04-11 DIAGNOSIS — Q231 Congenital insufficiency of aortic valve: Secondary | ICD-10-CM

## 2018-04-11 DIAGNOSIS — I352 Nonrheumatic aortic (valve) stenosis with insufficiency: Secondary | ICD-10-CM | POA: Insufficient documentation

## 2018-04-11 DIAGNOSIS — I340 Nonrheumatic mitral (valve) insufficiency: Secondary | ICD-10-CM | POA: Insufficient documentation

## 2018-04-11 DIAGNOSIS — I351 Nonrheumatic aortic (valve) insufficiency: Secondary | ICD-10-CM | POA: Insufficient documentation

## 2018-04-11 DIAGNOSIS — E785 Hyperlipidemia, unspecified: Secondary | ICD-10-CM | POA: Insufficient documentation

## 2018-04-11 HISTORY — DX: Nonrheumatic aortic (valve) stenosis: I35.0

## 2018-04-11 HISTORY — DX: Thoracic aortic ectasia: I77.810

## 2018-04-11 HISTORY — DX: Congenital stenosis of aortic valve: Q23.0

## 2018-04-11 LAB — BASIC METABOLIC PANEL
BUN/Creatinine Ratio: 14 (ref 10–24)
BUN: 15 mg/dL (ref 8–27)
CO2: 23 mmol/L (ref 20–29)
Calcium: 9.2 mg/dL (ref 8.6–10.2)
Chloride: 101 mmol/L (ref 96–106)
Creatinine, Ser: 1.09 mg/dL (ref 0.76–1.27)
GFR calc Af Amer: 79 mL/min/{1.73_m2} (ref >59)
GFR calc non Af Amer: 68 mL/min/{1.73_m2} (ref >59)
Glucose: 93 mg/dL (ref 65–99)
Potassium: 4.6 mmol/L (ref 3.5–5.2)
Sodium: 139 mmol/L (ref 134–144)

## 2018-04-11 NOTE — Progress Notes (Signed)
Cardiology Office Note    Date:  04/11/2018   ID:  Theodore Patton, DOB Dec 20, 1945, MRN [id number]  PCP:  Prince Solian, MD  Cardiologist:  Fransico Him, MD   No chief complaint on file.   History of Present Illness:  Theodore Patton is a 72 y.o. 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  This is a very pleasant 72 year old male with a history of GERD and arthritis who has had a heart murmur for many years.  He presented for routine physical exam recently and his PCP was concerned that his heart murmur had changed and referred him for a 2D echocardiogram.  This showed normal LV function with mild MR and a bicuspid aortic valve with mild aortic stenosis.  He is here today for followup and is doing well.  He denies any chest pain or pressure, SOB, DOE, PND, orthopnea, LE edema, dizziness, palpitations or syncope. He is compliant with his meds and is tolerating meds with no SE.      Past Medical History:  Diagnosis Date  . Arthritis   . GERD (gastroesophageal reflux disease)   . 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 PILONIDAL CYST EXCISION    . SHOULDER ARTHROSCOPY Right 2008  . TOTAL SHOULDER ARTHROPLASTY Right 07/24/2016   Procedure: TOTAL SHOULDER ARTHROPLASTY;  Surgeon: Marchia Bond, MD;  Location: [facility];  Service: Orthopedics;  Laterality: Right;    Current Medications: No outpatient medications have been marked as taking for the 04/11/18 encounter (Appointment) with Theodore Margarita, MD.    Allergies:   Sulfa antibiotics   Social History   Socioeconomic History  . Marital status: Married    Spouse name: Not on file  . Number of children: Not on file  . Years of education: Not on file  . Highest education level: Not on file  Occupational History  . Not on  file  Social Needs  . Financial resource strain: Not on file  . Food insecurity:    Worry: Not on file    Inability: Not on file  . Transportation needs:    Medical: Not on file    Non-medical: Not on file  Tobacco Use  . Smoking status: Former Smoker    Types: Cigarettes    Last attempt to quit: 07/12/1980    Years since quitting: 37.7  . Smokeless tobacco: Never Used  Substance and Sexual Activity  . Alcohol use: Yes    Alcohol/week: 1.0 standard drinks    Types: 1 Cans of beer per week  . Drug use: No  . Sexual activity: Not on file  Lifestyle  . Physical activity:    Days per week: Not on file    Minutes per session: Not on file  . Stress: Not on file  Relationships  . Social connections:    Talks on phone: Not on file    Gets together: Not on file    Attends religious service: Not on file    Active member of club or organization: Not on file    Attends meetings of clubs or organizations: Not on file    Relationship status: Not on file  Other Topics Concern  . Not on file  Social History Narrative  . Not on file  Family History:  The patient's family history is not on file.   ROS:   Please see the history of present illness.    ROS All other systems reviewed and are negative.  No flowsheet data found.     PHYSICAL EXAM:   VS:  There were no vitals taken for this visit.   GEN: Well nourished, well developed, in no acute distress  HEENT: normal  Neck: no JVD, carotid bruits, or masses Cardiac: RRR; no rubs, or gallops,no edema.  Intact distal pulses bilaterally.  I do not appreciate a murmur over the aortic valve area but do hear a late systolic murmur over the apex consistent with mitral regurgitation. Respiratory:  clear to auscultation bilaterally, normal work of breathing GI: soft, nontender, nondistended, + BS MS: no deformity or atrophy  Skin: warm and dry, no rash Neuro:  Alert and Oriented x 3, Strength and sensation are intact Psych: euthymic  mood, full affect  Wt Readings from Last 3 Encounters:  12/17/16 208 lb (94.3 kg)  07/24/16 209 lb 14 oz (95.2 kg)  07/12/16 209 lb 14.4 oz (95.2 kg)      Studies/Labs Reviewed:   EKG:  EKG isnot ordered today.    Recent Labs: No results found for requested labs within last 8760 hours.   Lipid Panel No results found for: CHOL, TRIG, HDL, CHOLHDL, VLDL, LDLCALC, LDLDIRECT  Additional studies/ records that were reviewed today include:  2D echo    ASSESSMENT:    No diagnosis found.   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  He is completely asymptomatic.  I will repeat an echo in 1 year to make sure this is not progressed.  2.  Ascending aortic dilatation -2D echo showed mildly enlarged a sending aorta at 39 mm.  Given his bicuspid AV history as well I am going to get a chest CT angios to rule out any other areas of aneurysmal dilatation.  Repeat echo in 1 year to make sure this is stable  3.  Mitral regurgitation -this was mild on recent echo  Medication Adjustments/Labs and Tests Ordered: Current medicines are reviewed at length with the patient today.  Concerns regarding medicines are outlined above.  Medication changes, Labs and Tests ordered today are listed in the Patient Instructions below.  There are no Patient Instructions on file for this visit.   Signed, Fransico Him, MD  04/11/2018 9:32 AM    [address], [place], [place]  [address] Phone: [phone]; Fax: [phone]

## 2018-04-11 NOTE — Patient Instructions (Addendum)
Medication Instructions:  Your physician recommends that you continue on your current medications as directed. Please refer to the Current Medication list given to you today.  If you need a refill on your cardiac medications before your next appointment, please call your pharmacy.   Lab work: TODAY:  BMET  If you have labs (blood work) drawn today and your tests are completely normal, you will receive your results only by: Marland Kitchen MyChart Message (if you have MyChart) OR . A paper copy in the mail If you have any lab test that is abnormal or we need to change your treatment, we will call you to review the results.  Testing/Procedures: Your physician has requested that you have an echocardiogram IN 1 YEAR. Echocardiography is a painless test that uses sound waves to create images of your heart. It provides your doctor with information about the size and shape of your heart and how well your heart's chambers and valves are working. This procedure takes approximately one hour. There are no restrictions for this procedure.  Non-Cardiac CT Angiography (CTA), is a special type of CT scan that uses a computer to produce multi-dimensional views of major blood vessels throughout the body. In CT angiography, a contrast material is injected through an IV to help visualize the blood vessels   Follow-Up: At [facility], you and your health needs are our priority.  As part of our continuing mission to provide you with exceptional heart care, we have created designated Provider Care Teams.  These Care Teams include your primary Cardiologist (physician) and Advanced Practice Providers (APPs -  Physician Assistants and Nurse Practitioners) who all work together to provide you with the care you need, when you need it. You will need a follow up appointment in 1 years.  Please call our office 2 months in advance to schedule this appointment.  You may see Dr. Radford Pax or one of the following Advanced Practice Providers  on your designated Care Team:   [place], PA-C Melina Copa, PA-C . Ermalinda Barrios, PA-C  Any Other Special Instructions Will Be Listed Below (If Applicable). Echocardiogram An echocardiogram, or echocardiography, uses sound waves (ultrasound) to produce an image of your heart. The echocardiogram is simple, painless, obtained within a short period of time, and offers valuable information to your health care provider. The images from an echocardiogram can provide information such as:  Evidence of coronary artery disease (CAD).  Heart size.  Heart muscle function.  Heart valve function.  Aneurysm detection.  Evidence of a past heart attack.  Fluid buildup around the heart.  Heart muscle thickening.  Assess heart valve function.  Tell a health care provider about:  Any allergies you have.  All medicines you are taking, including vitamins, herbs, eye drops, creams, and over-the-counter medicines.  Any problems you or family members have had with anesthetic medicines.  Any blood disorders you have.  Any surgeries you have had.  Any medical conditions you have.  Whether you are pregnant or may be pregnant. What happens before the procedure? No special preparation is needed. Eat and drink normally. What happens during the procedure?  In order to produce an image of your heart, gel will be applied to your chest and a wand-like tool (transducer) will be moved over your chest. The gel will help transmit the sound waves from the transducer. The sound waves will harmlessly bounce off your heart to allow the heart images to be captured in real-time motion. These images will then be recorded.  You may need an IV to receive a medicine that improves the quality of the pictures. What happens after the procedure? You may return to your normal schedule including diet, activities, and medicines, unless your health care provider tells you otherwise. This information is not  intended to replace advice given to you by your health care provider. Make sure you discuss any questions you have with your health care provider. Document Released: 05/18/2000 Document Revised: 01/07/2016 Document Reviewed: 01/26/2013 Elsevier Interactive Patient Education  2017 [employer].    CT Angiogram A CT angiogram is a procedure to look at the blood vessels in various areas of the body. For this procedure, a large X-ray machine, called a CT scanner, takes detailed pictures of blood vessels that have been injected with a dye (contrast material). A CT angiogram allows your health care provider to see how well blood is flowing to the area of your body that is being checked. Your health care provider will be able to see if there are any problems, such as a blockage. Tell a health care provider about:  Any allergies you have.  All medicines you are taking, including vitamins, herbs, eye drops, creams, and over-the-counter medicines.  Any problems you or family members have had with anesthetic medicines.  Any blood disorders you have.  Any surgeries you have had.  Any medical conditions you have.  Whether you are pregnant or may be pregnant.  Whether you are breastfeeding.  Any anxiety disorders, chronic pain, or other conditions you have that may increase your stress or prevent you from lying still. What are the risks? Generally, this is a safe procedure. However, problems may occur, including:  Infection.  Bleeding.  Allergic reactions to medicines or dyes.  Damage to other structures or organs.  Kidney damage from the dye or contrast that is used.  Increased risk of cancer from radiation exposure. This risk is low. Talk with your health care provider about: ? The risks and benefits of testing. ? How you can receive the lowest dose of radiation.  What happens before the procedure?  Wear comfortable clothing and remove any jewelry.  Follow instructions from your  health care provider about eating and drinking. For most people, instructions may include these actions: ? For 12 hours before the test, avoid caffeine. This includes tea, coffee, soda, and energy drinks or pills. ? For 3-4 hours before the test, stop eating or drinking anything but water. ? Stay well hydrated by continuing to drink water before the exam. This will help to clear the contrast dye from your body after the test.  Ask your health care provider about changing or stopping your regular medicines. This is especially important if you are taking diabetes medicines or blood thinners. What happens during the procedure?  An IV tube will be inserted into one of your veins.  You will be asked to lie on an exam table. This table will slide in and out of the CT machine during the procedure.  Contrast dye will be injected into the IV tube. You might feel warm, or you may get a metallic taste in your mouth.  The table that you are lying on will move into the CT machine tunnel for the scan.  The person running the machine will give you instructions while the scans are being done. You may be asked to: ? Keep your arms above your head. ? Hold your breath. ? Stay very still, even if the table is moving.  When  the scanning is complete, you will be moved out of the machine.  The IV tube will be removed. The procedure may vary among health care providers and hospitals. What happens after the procedure?  You might feel warm, or you may get a metallic taste in your mouth.  You may be asked to drink water or other fluids to wash (flush) the contrast material out of your body.  It is up to you to get the results of your procedure. Ask your health care provider, or the department that is doing the procedure, when your results will be ready. Summary  A CT angiogram is a procedure to look at the blood vessels in various areas of the body.  You will need to stay very still during the exam.  You  may be asked to drink water or other fluids to wash (flush) the contrast material out of your body after your scan. This information is not intended to replace advice given to you by your health care provider. Make sure you discuss any questions you have with your health care provider. Document Released: 01/19/2016 Document Revised: 01/19/2016 Document Reviewed: 01/19/2016 Elsevier Interactive Patient Education  [employer].

## 2018-04-14 ENCOUNTER — Telehealth: Payer: Self-pay | Admitting: *Deleted

## 2018-04-14 NOTE — Telephone Encounter (Signed)
DPR ok to lmom on [phone]. Lmom lab work looks good. If any questions please call [phone].

## 2018-04-14 NOTE — Telephone Encounter (Signed)
-----   Message from Nuala Alpha, LPN sent at 23/95/3202  8:31 AM EST -----   ----- Message ----- From: Sueanne Margarita, MD Sent: 04/13/2018   5:55 PM EST To: Prince Solian, MD, [facility] Triage  Please let patient know that labs were normal.  Continue current medical therapy.

## 2018-04-24 ENCOUNTER — Ambulatory Visit (INDEPENDENT_AMBULATORY_CARE_PROVIDER_SITE_OTHER)
Admission: RE | Admit: 2018-04-24 | Discharge: 2018-04-24 | Disposition: A | Discharge status: Home / Self Care | Payer: Medicare Other | Source: Ambulatory Visit | Attending: Cardiology | Admitting: Cardiology

## 2018-04-24 DIAGNOSIS — M1711 Unilateral primary osteoarthritis, right knee: Secondary | ICD-10-CM | POA: Diagnosis not present

## 2018-04-24 DIAGNOSIS — I77810 Thoracic aortic ectasia: Secondary | ICD-10-CM

## 2018-04-24 MED ORDER — IOPAMIDOL (ISOVUE-370) INJECTION 76%
100.0000 mL | Freq: Once | INTRAVENOUS | Status: AC | PRN
  Administered 2018-04-24: 100 mL via INTRAVENOUS

## 2018-05-07 DIAGNOSIS — M1711 Unilateral primary osteoarthritis, right knee: Secondary | ICD-10-CM | POA: Diagnosis not present

## 2018-05-13 DIAGNOSIS — M545 Low back pain: Secondary | ICD-10-CM | POA: Diagnosis not present

## 2018-05-14 DIAGNOSIS — M1711 Unilateral primary osteoarthritis, right knee: Secondary | ICD-10-CM | POA: Diagnosis not present

## 2018-05-16 DIAGNOSIS — M545 Low back pain: Secondary | ICD-10-CM | POA: Diagnosis not present

## 2018-05-20 DIAGNOSIS — M545 Low back pain: Secondary | ICD-10-CM | POA: Diagnosis not present

## 2018-07-12 IMAGING — CT CT SHOULDER*R* W/O CM
1 series · 12 of 14 positions shown, 15 images · non-contrast
Comparison: None.

CLINICAL DATA: Acute right shoulder pain, pre total shoulder
arthroplasty.

EXAM:
CT OF THE UPPER RIGHT EXTREMITY WITHOUT CONTRAST
TECHNIQUE: Multidetector CT imaging of the upper right extremity was performed
according to the standard protocol.

[Series 4: soft tissue · axial · 0.53mm/px · z∈[-185,-29]mm · 12 of 94 slices shown, 15 images]
[im 8/94  soft-tissue]
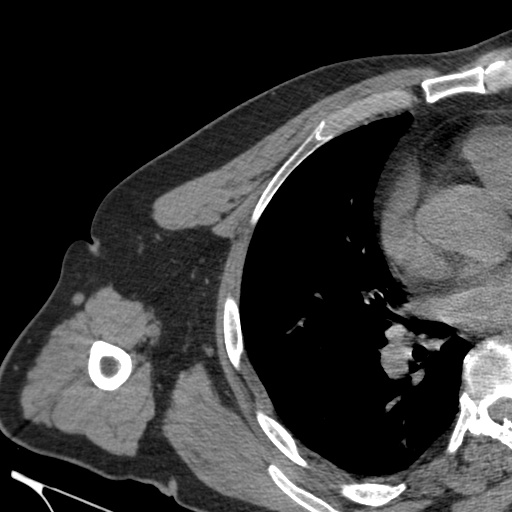
[im 8/94  bone]
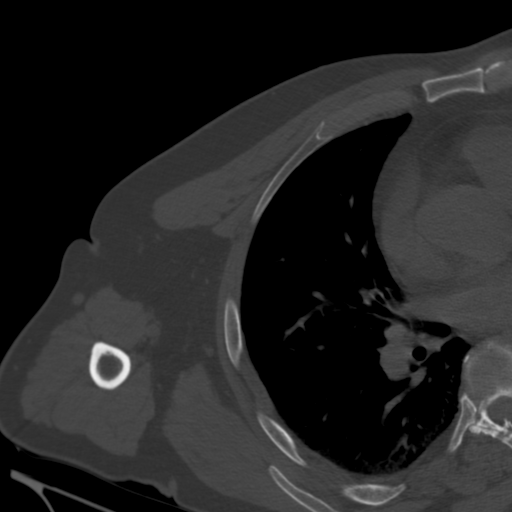
[im 15/94  bone]
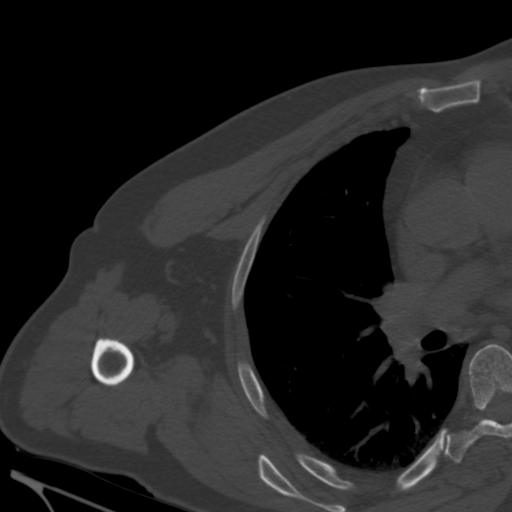
[im 22/94  bone]
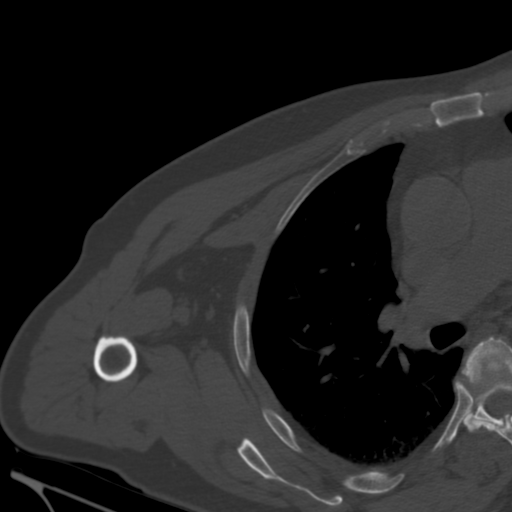
[im 29/94  bone]
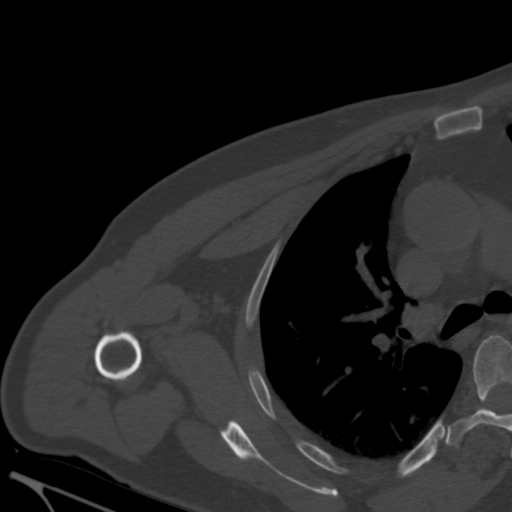
[im 36/94  soft-tissue]
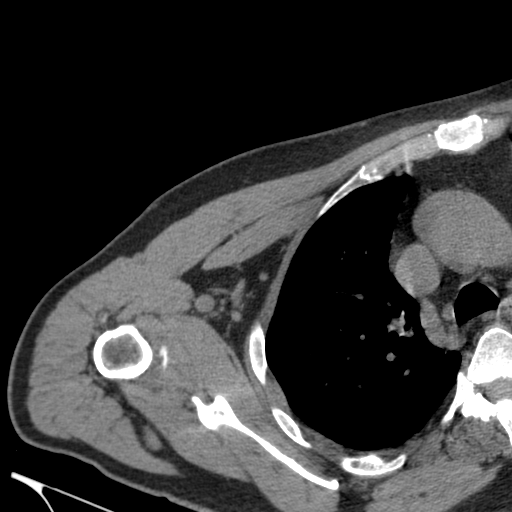
[im 36/94  bone]
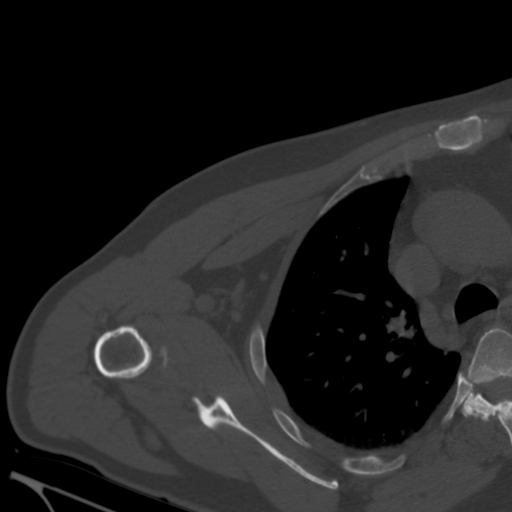
[im 43/94  bone]
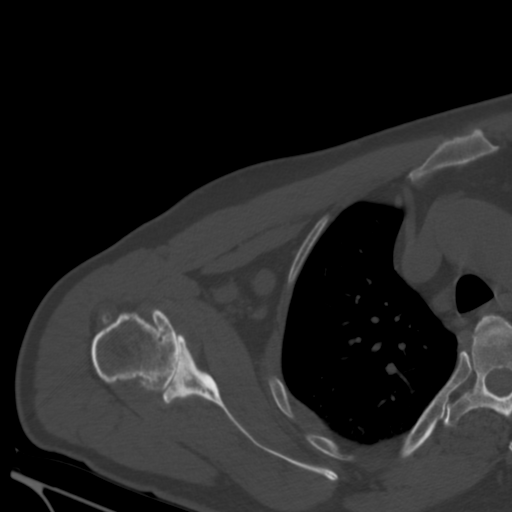
[im 51/94  bone]
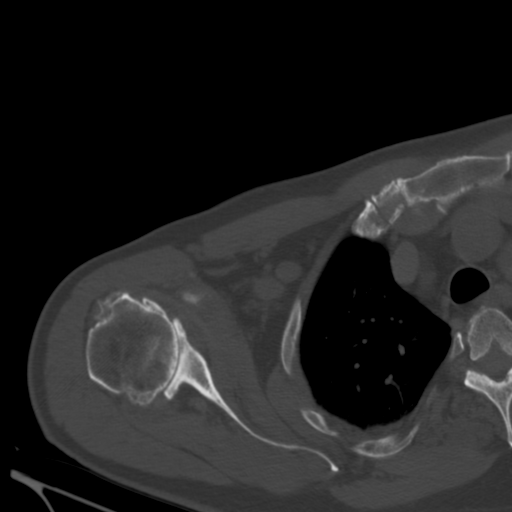
[im 58/94  bone]
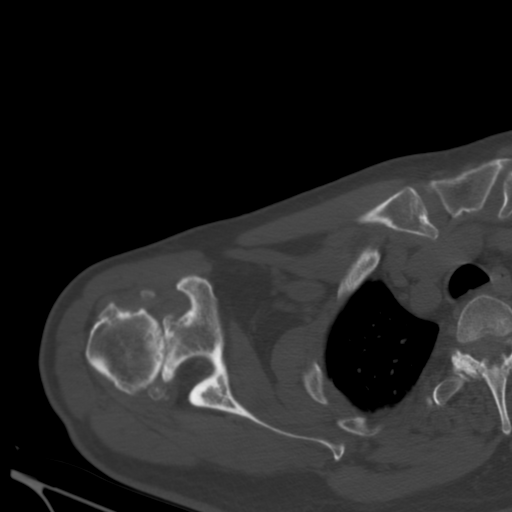
[im 65/94  soft-tissue]
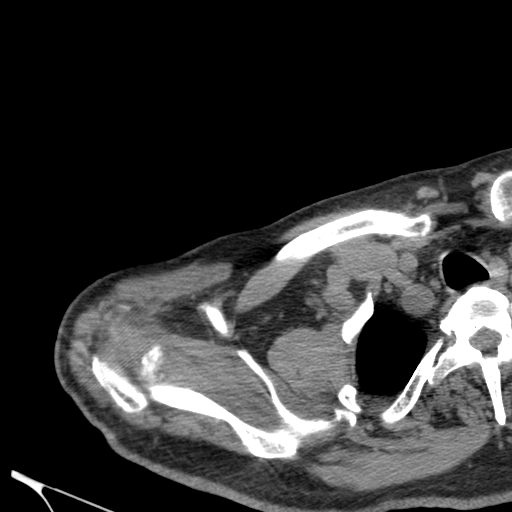
[im 65/94  bone]
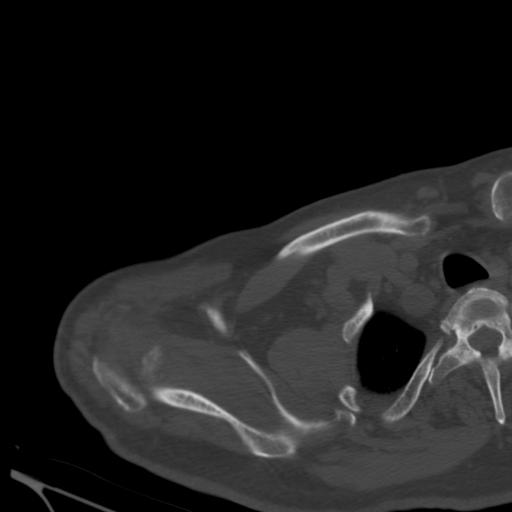
[im 72/94  bone]
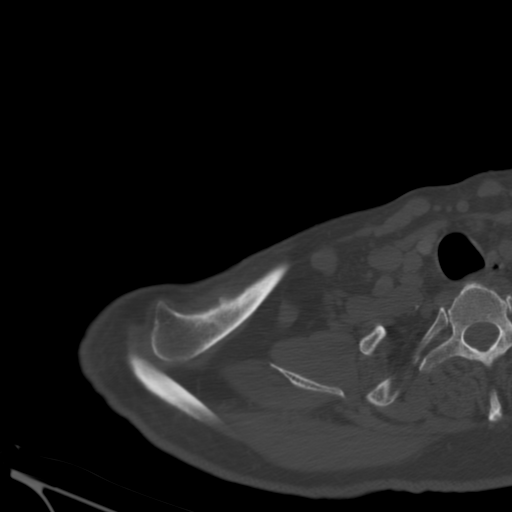
[im 79/94  bone]
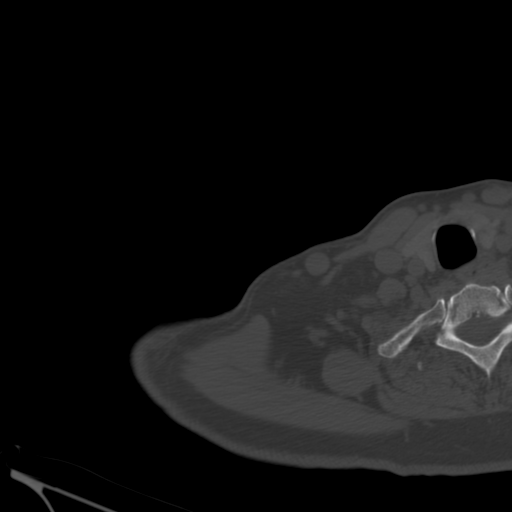
[im 86/94  bone]
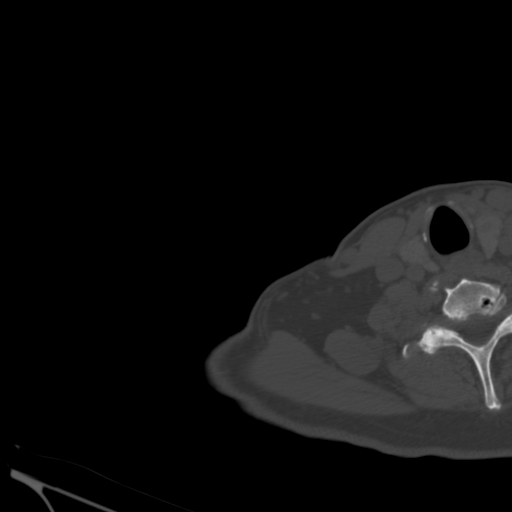

[12 of 14 positions shown; findings below may reference images not displayed]

FINDINGS: Bones/Joint/Cartilage

Bone-on-bone apposition of the humeral head with glenoid fossa with
inferomedial spurring off the humeral head, remodeled appearance of
the glenoid fossa with subchondral sclerosis and degenerative cystic
lucencies involving the articulating portion of the humeral head and
glenoid. Ossifications are noted about the glenohumeral joint
involving the rotator cuff muscles, the largest is just cephalad to
the superior rim of the glenoid along the supraspinous measuring 25
x 9 x 20 mm. A second is seen caudad to the coracoid process
measuring 11 x 5 x 13 mm along the upper margin of the
subscapularis. A third measures 5 x 7 x 7 mm at the junction of the
subscapularis and anterior fibers of the supraspinatus. These are
likely reflections of old remote trauma and/or repetitive injury.

The acromioclavicular joint is maintained.

Ligaments

Suboptimally assessed by CT.

Muscles and Tendons

Calcification noted along the biceps tendon at the level of the
bicipital groove.

Soft tissues

No soft tissue masses. No acute pulmonary abnormality. Small
subpleural blebs along the right lower lobe posteriorly.
IMPRESSION: Marked osteoarthritis of the glenohumeral joint with bone-on-bone
apposition, subchondral sclerosis and degenerative cystic change
involving the articulating portion of the humeral head and glenoid
fossa.

Ossific densities involving the supraspinous and subscapularis
muscles likely related to old remote trauma or repetitive injuries.
Intra-articular loose bodies believed less likely.

## 2018-09-11 DIAGNOSIS — M109 Gout, unspecified: Secondary | ICD-10-CM | POA: Diagnosis not present

## 2018-09-11 DIAGNOSIS — E7849 Other hyperlipidemia: Secondary | ICD-10-CM | POA: Diagnosis not present

## 2018-09-11 DIAGNOSIS — Z125 Encounter for screening for malignant neoplasm of prostate: Secondary | ICD-10-CM | POA: Diagnosis not present

## 2018-09-11 DIAGNOSIS — R7302 Impaired glucose tolerance (oral): Secondary | ICD-10-CM | POA: Diagnosis not present

## 2018-09-15 DIAGNOSIS — R82998 Other abnormal findings in urine: Secondary | ICD-10-CM | POA: Diagnosis not present

## 2018-09-18 DIAGNOSIS — D696 Thrombocytopenia, unspecified: Secondary | ICD-10-CM | POA: Diagnosis not present

## 2018-09-18 DIAGNOSIS — F39 Unspecified mood [affective] disorder: Secondary | ICD-10-CM | POA: Diagnosis not present

## 2018-09-18 DIAGNOSIS — R7302 Impaired glucose tolerance (oral): Secondary | ICD-10-CM | POA: Diagnosis not present

## 2018-09-18 DIAGNOSIS — Z1331 Encounter for screening for depression: Secondary | ICD-10-CM | POA: Diagnosis not present

## 2018-09-18 DIAGNOSIS — J309 Allergic rhinitis, unspecified: Secondary | ICD-10-CM | POA: Diagnosis not present

## 2018-09-18 DIAGNOSIS — N401 Enlarged prostate with lower urinary tract symptoms: Secondary | ICD-10-CM | POA: Diagnosis not present

## 2018-09-18 DIAGNOSIS — E785 Hyperlipidemia, unspecified: Secondary | ICD-10-CM | POA: Diagnosis not present

## 2018-09-18 DIAGNOSIS — M1990 Unspecified osteoarthritis, unspecified site: Secondary | ICD-10-CM | POA: Diagnosis not present

## 2018-09-18 DIAGNOSIS — I358 Other nonrheumatic aortic valve disorders: Secondary | ICD-10-CM | POA: Diagnosis not present

## 2018-09-18 DIAGNOSIS — Z0000 Encounter for general adult medical examination without abnormal findings: Secondary | ICD-10-CM | POA: Diagnosis not present

## 2018-12-17 ENCOUNTER — Telehealth: Payer: Self-pay

## 2018-12-17 NOTE — Telephone Encounter (Signed)
Pt is scheduled to see Pecolia Ades, NP on 12/25/2018

## 2018-12-17 NOTE — Telephone Encounter (Signed)
   Primary Cardiologist:Traci Turner, MD  Chart reviewed as part of pre-operative protocol coverage. Because of Theodore Patton's past medical history and time since last visit, he/she will require a follow-up visit in order to better assess preoperative cardiovascular risk.  Please schedule appt in July if possible with Dr. Radford Pax or APP.    Pre-op covering staff: - Please schedule appointment and call patient to inform them. - Please contact requesting surgeon's office via preferred method (i.e, phone, fax) to inform them of need for appointment prior to surgery.  If applicable, this message will also be routed to pharmacy pool and/or primary cardiologist for input on holding anticoagulant/antiplatelet agent as requested below so that this information is available at time of patient's appointment.   Cecilie Kicks, NP  12/17/2018, 1:46 PM

## 2018-12-17 NOTE — Telephone Encounter (Signed)
   [facility] Pre-operative Risk Assessment    Request for surgical clearance:  1. What type of surgery is being performed? Right Partial Knee Replacement   2. When is this surgery scheduled? TBD   3. What type of clearance is required (medical clearance vs. Pharmacy clearance to hold med vs. Both)? Medical   4. Are there any medications that need to be held prior to surgery and how long?    5. Practice name and name of physician performing surgery? [facility]...  Dr. Marchia Bond  6. What is your office phone number [phone] ext [phone] Sherri    7.   What is your office fax number  [phone] attn: Sherri  8.   Anesthesia type (None, local, MAC, general) ?

## 2018-12-24 ENCOUNTER — Telehealth: Payer: Self-pay

## 2018-12-24 NOTE — Telephone Encounter (Signed)
   PT ANSWERED NO TO ALL QUESTIONS   COVID-19 Pre-Screening Questions:  . In the past 7 to 10 days have you had a cough,  shortness of breath, headache, congestion, fever (100 or greater) body aches, chills, sore throat, or sudden loss of taste or sense of smell? . Have you been around anyone with known Covid 19. . Have you been around anyone who is awaiting Covid 19 test results in the past 7 to 10 days? . Have you been around anyone who has been exposed to Covid 19, or has mentioned symptoms of Covid 19 within the past 7 to 10 days?  If you have any concerns/questions about symptoms patients report during screening (either on the phone or at threshold). Contact the provider seeing the patient or DOD for further guidance.  If neither are available contact a member of the leadership team.

## 2018-12-25 ENCOUNTER — Encounter: Payer: Self-pay | Admitting: Cardiology

## 2018-12-25 ENCOUNTER — Ambulatory Visit: Payer: Medicare Other | Admitting: Cardiology

## 2018-12-25 ENCOUNTER — Ambulatory Visit (INDEPENDENT_AMBULATORY_CARE_PROVIDER_SITE_OTHER): Payer: Medicare Other | Admitting: Cardiology

## 2018-12-25 ENCOUNTER — Other Ambulatory Visit: Payer: Self-pay

## 2018-12-25 VITALS — BP 126/72 | HR 73 | Ht 73.0 in | Wt 199.1 lb

## 2018-12-25 DIAGNOSIS — I77810 Thoracic aortic ectasia: Secondary | ICD-10-CM | POA: Diagnosis not present

## 2018-12-25 DIAGNOSIS — I351 Nonrheumatic aortic (valve) insufficiency: Secondary | ICD-10-CM

## 2018-12-25 DIAGNOSIS — Z01810 Encounter for preprocedural cardiovascular examination: Secondary | ICD-10-CM | POA: Diagnosis not present

## 2018-12-25 DIAGNOSIS — I358 Other nonrheumatic aortic valve disorders: Secondary | ICD-10-CM | POA: Diagnosis not present

## 2018-12-25 NOTE — Patient Instructions (Signed)
Medication Instructions:  Your physician recommends that you continue on your current medications as directed. Please refer to the Current Medication list given to you today.  If you need a refill on your cardiac medications before your next appointment, please call your pharmacy.   Lab work: None   If you have labs (blood work) drawn today and your tests are completely normal, you will receive your results only by: Marland Kitchen MyChart Message (if you have MyChart) OR . A paper copy in the mail If you have any lab test that is abnormal or we need to change your treatment, we will call you to review the results.  Testing/Procedures: Your physician has requested that you have an echocardiogram in August. Echocardiography is a painless test that uses sound waves to create images of your heart. It provides your doctor with information about the size and shape of your heart and how well your heart's chambers and valves are working. This procedure takes approximately one hour. There are no restrictions for this procedure.    Follow-Up: At [facility], you and your health needs are our priority.  As part of our continuing mission to provide you with exceptional heart care, we have created designated Provider Care Teams.  These Care Teams include your primary Cardiologist (physician) and Advanced Practice Providers (APPs -  Physician Assistants and Nurse Practitioners) who all work together to provide you with the care you need, when you need it. You will need a follow up appointment in 4 months.  Please call our office 2 months in advance to schedule this appointment.  You may see Fransico Him, MD or one of the following Advanced Practice Providers on your designated Care Team:   [place], PA-C Melina Copa, PA-C . Ermalinda Barrios, PA-C  Any Other Special Instructions Will Be Listed Below (If Applicable).                                   Pecolia Ades, NP

## 2018-12-25 NOTE — Progress Notes (Signed)
Cardiology Office Note:    Date:  12/25/2018   ID:  Theodore CORTESE, DOB 1946-02-23, MRN [id number]  PCP:  Prince Solian, MD  Cardiologist:  Fransico Him, MD  Referring MD: Prince Solian, MD   Chief Complaint  Patient presents with  . Pre-op Exam    History of Present Illness:    Theodore Patton is a 73 y.o. 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  He also has a history of GERD and arthritis.    He was seen for evaluation of his aortic stenosis and ascending aortic aneurysm by Dr. Radford Pax on 04/11/2018.  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  He was completely asymptomatic.  He was planned to have an echocardiogram repeated in 1 year to ensure lack of progression, planned for 04/2019.   Theodore Patton is being seen today for clearance for right partial knee replacement. He is active. He is retired but works around the yard and around [employer]. He stays active. He has not been walking for exercise due to his knee pain. [facility]. He goes camping about 7 times per year with a lot of walking. No chest pain/pressure, shortness of breath, lightheadedness, syncope, orthopnea or PND.    He plans to have his surgery in November.   Past Medical History:  Diagnosis Date  . Aortic stenosis due to bicuspid aortic valve 04/11/2018   Bicuspid AV with mild AS by echo 2019  . Arthritis   . Ascending aorta dilatation (HCC) 04/11/2018  . GERD (gastroesophageal reflux disease)   . Hyperlipemia   . 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 PILONIDAL CYST EXCISION    . SHOULDER ARTHROSCOPY Right 2008  . TOTAL SHOULDER ARTHROPLASTY Right 07/24/2016   Procedure: TOTAL  SHOULDER ARTHROPLASTY;  Surgeon: Marchia Bond, MD;  Location: [place];  Service: Orthopedics;  Laterality: Right;    Current Medications: Current Meds  Medication Sig  . acetaminophen (TYLENOL) 500 MG tablet Take 1,000 mg by mouth 2 (two) times daily.  Marland Kitchen allopurinol (ZYLOPRIM) 300 MG tablet Take 300 mg by mouth daily.  Marland Kitchen buPROPion (WELLBUTRIN XL) 300 MG 24 hr tablet Take 300 mg by mouth daily.  . fluticasone (FLONASE) 50 MCG/ACT nasal spray Place 1-2 sprays into both nostrils at bedtime as needed. For allergies/nasal congestion  . gabapentin (NEURONTIN) 300 MG capsule Take 600 mg by mouth 3 (three) times daily.  . Glucosamine-Chondroitin (COSAMIN DS PO) Take 2 tablets by mouth 2 (two) times daily.  Marland Kitchen loratadine (CLARITIN) 10 MG tablet Take 10 mg by mouth at bedtime.  . Multiple Vitamin (MULTIVITAMIN WITH MINERALS) TABS tablet Take 1 tablet by mouth daily. Complete Multivitamin  . Omega-3 Fatty Acids (FISH OIL) 1200 MG CAPS Take 1,200 mg by mouth 2 (two) times daily.  . tamsulosin (FLOMAX) 0.4 MG CAPS capsule Take 0.4 mg by mouth at bedtime.  . TURMERIC PO Take 1 capsule by mouth daily.     Allergies:   Sulfa antibiotics   Social History   Socioeconomic History  . Marital status: Married    Spouse name: Not on file  . Number of children: Not on file  . Years of  education: Not on file  . Highest education level: Not on file  Occupational History  . Not on file  Social Needs  . Financial resource strain: Not on file  . Food insecurity    Worry: Not on file    Inability: Not on file  . Transportation needs    Medical: Not on file    Non-medical: Not on file  Tobacco Use  . Smoking status: Former Smoker    Types: Cigarettes    Quit date: 07/12/1980    Years since quitting: 38.4  . Smokeless tobacco: Never Used  Substance and Sexual Activity  . Alcohol use: Yes    Alcohol/week: 1.0 standard drinks    Types: 1 Cans of beer per week  . Drug use: No  . Sexual activity: Not on  file  Lifestyle  . Physical activity    Days per week: Not on file    Minutes per session: Not on file  . Stress: Not on file  Relationships  . Social Herbalist on phone: Not on file    Gets together: Not on file    Attends religious service: Not on file    Active member of club or organization: Not on file    Attends meetings of clubs or organizations: Not on file    Relationship status: Not on file  Other Topics Concern  . Not on file  Social History Narrative  . Not on file     Family History: The patient's family history includes Healthy in his sister; Heart Problems in his sister; Liver cancer in his brother. ROS:   Please see the history of present illness.     All other systems reviewed and are negative.  EKGs/Labs/Other Studies Reviewed:    The following studies were reviewed today:  Echocardiogram 02/25/2018 Study Conclusions  - Left ventricle: There is a small mobile echodensity in the left   ventricular apex that most likely represents a partial ruptured   chordea. The cavity size was normal. There was mild concentric   hypertrophy. Systolic function was vigorous. The estimated   ejection fraction was in the range of 65% to 70%. Doppler   parameters are consistent with abnormal left ventricular   relaxation (grade 1 diastolic dysfunction). - Aortic valve: Functionally bicuspid; moderately thickened,   moderately calcified leaflets. There was mild stenosis. There was   moderate regurgitation. Mean gradient (S): 9 mm Hg. Valve area   (VTI): 3.4 cm^2. Valve area (Vmax): 2.98 cm^2. Valve area   (Vmean): 2.98 cm^2. - Ascending aorta: The ascending aorta was normal in size measuring   39 mm. - Mitral valve: There was mild regurgitation. - Left atrium: The atrium was moderately dilated. - Right ventricle: The cavity size was normal. Wall thickness was   normal. Systolic function was normal. - Right atrium: The atrium was normal in size. -  Pericardium, extracardiac: The pericardium was normal in   appearance.   EKG:  EKG is ordered today.  The ekg ordered today demonstrates NSR, 76 bpm, minimal voltage criteria for LVH, may be normal variant.   Recent Labs: 04/11/2018: BUN 15; Creatinine, Ser 1.09; Potassium 4.6; Sodium 139   Recent Lipid Panel No results found for: CHOL, TRIG, HDL, CHOLHDL, VLDL, LDLCALC, LDLDIRECT  Physical Exam:    VS:  BP 126/72   Pulse 73   Ht 6\' 1"  (1.854 m)   Wt 199 lb 1.9 oz (90.3 kg)   SpO2 95%   BMI 26.27  kg/m     Wt Readings from Last 3 Encounters:  12/25/18 199 lb 1.9 oz (90.3 kg)  04/11/18 207 lb 12.8 oz (94.3 kg)  12/17/16 208 lb (94.3 kg)     Physical Exam  Constitutional: He is oriented to person, place, and time. He appears well-developed and well-nourished. No distress.  HENT:  Head: Normocephalic and atraumatic.  Neck: Normal range of motion. Neck supple. No JVD present.  Cardiovascular: Normal rate, regular rhythm, normal heart sounds and intact distal pulses. Exam reveals no gallop and no friction rub.  No murmur heard. If there is a murmur it is very faint. Valve closure seems well defined.   Pulmonary/Chest: Effort normal and breath sounds normal. No respiratory distress. He has no wheezes. He has no rales.  Abdominal: Soft. Bowel sounds are normal.  Musculoskeletal: Normal range of motion.        General: No edema.  Neurological: He is alert and oriented to person, place, and time.  Skin: Skin is warm and dry.  Psychiatric: He has a normal mood and affect. His behavior is normal. Judgment and thought content normal.  Vitals reviewed.    ASSESSMENT:    1. Aortic valve sclerosis   2. Nonrheumatic aortic valve insufficiency   3. Ascending aorta dilation (HCC)   4. Preop cardiovascular exam    PLAN:    In order of problems listed above:  Bicuspid aortic valve with mild aortic stenosis/moderate regurgitaiton: Echocardiogram in 2019 showed mild aortic stenosis  with mean aortic gradient of 9 mmHg, moderate regurgitation, normal LV systolic function. I do not appreciate a significant murmur. Pt is active with no symptoms. Planned repeat echocardiogram to ensure there has been no progression for November, but will go ahead and check echo in August in preparation for surgery.  Ascending aortic dilatation: Mildly enlarged ascending aorta at 109mm noted in 2019.  CTA of the chest showed no evidence of aneurysmal disease of the thoracic aorta.  Aortic measurements were within normal limits.  Will reassess on echo as above.  Cardiac clearance for partial knee replacement: Pt with no history of CAD, heart failure, stroke, kidney disease or diabetes. He has aortic valve disease which is mild and not causing any symptoms. We will repeat an echocardiogram to ensure no progression of disease. He is clear for surgery at this point and can be scheduled for the future (he wishes surgery in November). If his echo is not stable we will let pt and Dr. Mardelle Matte know.    Medication Adjustments/Labs and Tests Ordered: Current medicines are reviewed at length with the patient today.  Concerns regarding medicines are outlined above. Labs and tests ordered and medication changes are outlined in the patient instructions below:  Patient Instructions  Medication Instructions:  Your physician recommends that you continue on your current medications as directed. Please refer to the Current Medication list given to you today.  If you need a refill on your cardiac medications before your next appointment, please call your pharmacy.   Lab work: None   If you have labs (blood work) drawn today and your tests are completely normal, you will receive your results only by: Marland Kitchen MyChart Message (if you have MyChart) OR . A paper copy in the mail If you have any lab test that is abnormal or we need to change your treatment, we will call you to review the results.  Testing/Procedures: Your  physician has requested that you have an echocardiogram in August. Echocardiography is a painless  test that uses sound waves to create images of your heart. It provides your doctor with information about the size and shape of your heart and how well your heart's chambers and valves are working. This procedure takes approximately one hour. There are no restrictions for this procedure.    Follow-Up: At [facility], you and your health needs are our priority.  As part of our continuing mission to provide you with exceptional heart care, we have created designated Provider Care Teams.  These Care Teams include your primary Cardiologist (physician) and Advanced Practice Providers (APPs -  Physician Assistants and Nurse Practitioners) who all work together to provide you with the care you need, when you need it. You will need a follow up appointment in 4 months.  Please call our office 2 months in advance to schedule this appointment.  You may see Fransico Him, MD or one of the following Advanced Practice Providers on your designated Care Team:   [place], PA-C Melina Copa, PA-C . Ermalinda Barrios, PA-C  Any Other Special Instructions Will Be Listed Below (If Applicable).                                   Pecolia Ades, NP     Signed, Daune Perch, NP  12/25/2018 3:05 PM    [facility]

## 2019-01-07 ENCOUNTER — Other Ambulatory Visit: Payer: Self-pay

## 2019-01-07 ENCOUNTER — Ambulatory Visit (HOSPITAL_COMMUNITY): Payer: Medicare Other | Attending: Internal Medicine

## 2019-01-07 DIAGNOSIS — I351 Nonrheumatic aortic (valve) insufficiency: Secondary | ICD-10-CM | POA: Diagnosis not present

## 2019-01-07 HISTORY — DX: Nonrheumatic aortic (valve) insufficiency: I35.1

## 2019-01-19 ENCOUNTER — Other Ambulatory Visit: Payer: Self-pay | Admitting: Orthopedic Surgery

## 2019-04-02 NOTE — Patient Instructions (Addendum)
DUE TO COVID-19 ONLY ONE VISITOR IS ALLOWED TO COME WITH YOU AND STAY IN THE WAITING ROOM ONLY DURING PRE OP AND PROCEDURE DAY OF SURGERY. THE 1 VISITOR MAY VISIT WITH YOU AFTER SURGERY IN YOUR PRIVATE ROOM DURING VISITING HOURS ONLY!  YOU NEED TO HAVE A COVID 19 TEST ON__Friday 11/06/2020____ @_____3 :10PM__, THIS TEST MUST BE DONE BEFORE SURGERY, COME  [place] , [id number].  ([place]) ONCE YOUR COVID TEST IS COMPLETED, PLEASE BEGIN THE QUARANTINE INSTRUCTIONS AS OUTLINED IN YOUR HANDOUT.                Theodore Patton    Your procedure is scheduled [id number] 04/14/2019    Report to [facility] Main  Entrance    Report to Short Stay at  [place]  AM     Call this number if you have problems the morning of surgery [phone]    Remember: Do not eat food  :After Midnight.    NO SOLID FOOD AFTER MIDNIGHT THE NIGHT PRIOR TO SURGERY. NOTHING BY MOUTH EXCEPT CLEAR LIQUIDS UNTIL  0430 am .     PLEASE FINISH ENSURE DRINK PER SURGEON ORDER  WHICH NEEDS TO BE COMPLETED AT 0430 am.   CLEAR LIQUID DIET   Foods Allowed                                                                     Foods Excluded  Coffee and tea, regular and decaf                             liquids that you cannot  Plain Jell-O any favor except red or purple                                           see through such as: Fruit ices (not with fruit pulp)                                     milk, soups, orange juice  Iced Popsicles                                    All solid food Carbonated beverages, regular and diet                                    Cranberry, grape and apple juices Sports drinks like Gatorade Lightly seasoned clear broth or consume(fat free) Sugar, honey syrup  Sample Menu Breakfast                                Lunch  Supper Cranberry juice                    Beef broth                            Chicken  broth Jell-O                                     Grape juice                           Apple juice Coffee or tea                        Jell-O                                      Popsicle                                                Coffee or tea                        Coffee or tea  _____________________________________________________________________       BRUSH YOUR TEETH MORNING OF SURGERY AND RINSE YOUR MOUTH OUT, NO CHEWING GUM CANDY OR MINTS.     Take these medicines the morning of surgery with A SIP OF WATER: Gabapentin (Neurontin), Bupropion (Wellbutrin XL), Allopurinol (Zyloprim)                                 You may not have any metal on your body including hair pins and              piercings  Do not wear jewelry, make-up, lotions, powders or perfumes, deodorant             Do not wear nail polish on your fingernails.  Do not shave  48 hours prior to surgery.              Men may shave face and neck.   Do not bring valuables to the hospital. [place].  Contacts, dentures or bridgework may not be worn into surgery.  Leave suitcase in the car. After surgery it may be brought to your room.                  Please read over the following fact sheets you were given: _____________________________________________________________________             [facility] - Preparing for Surgery Before surgery, you can play an important role.  Because skin is not sterile, your skin needs to be as free of germs as possible.  You can reduce the number of germs on your skin by washing with CHG (chlorahexidine gluconate) soap before surgery.  CHG is an antiseptic cleaner which kills germs and bonds with the skin to continue killing germs even after washing. Please DO NOT  use if you have an allergy to CHG or antibacterial soaps.  If your skin becomes reddened/irritated stop using the CHG and inform your nurse when you arrive at Short  Stay. Do not shave (including legs and underarms) for at least 48 hours prior to the first CHG shower.  You may shave your face/neck. Please follow these instructions carefully:  1.  Shower with CHG Soap the night before surgery and the  morning of Surgery.  2.  If you choose to wash your hair, wash your hair first as usual with your  normal  shampoo.  3.  After you shampoo, rinse your hair and body thoroughly to remove the  shampoo.                           4.  Use CHG as you would any other liquid soap.  You can apply chg directly  to the skin and wash                       Gently with a scrungie or clean washcloth.  5.  Apply the CHG Soap to your body ONLY FROM THE NECK DOWN.   Do not use on face/ open                           Wound or open sores. Avoid contact with eyes, ears mouth and genitals (private parts).                       Wash face,  Genitals (private parts) with your normal soap.             6.  Wash thoroughly, paying special attention to the area where your surgery  will be performed.  7.  Thoroughly rinse your body with warm water from the neck down.  8.  DO NOT shower/wash with your normal soap after using and rinsing off  the CHG Soap.                9.  Pat yourself dry with a clean towel.            10.  Wear clean pajamas.            11.  Place clean sheets on your bed the night of your first shower and do not  sleep with pets. Day of Surgery : Do not apply any lotions/deodorants the morning of surgery.  Please wear clean clothes to the hospital/surgery center.  FAILURE TO FOLLOW THESE INSTRUCTIONS MAY RESULT IN THE CANCELLATION OF YOUR SURGERY PATIENT SIGNATURE_________________________________  NURSE SIGNATURE__________________________________  ________________________________________________________________________   Theodore Patton  An incentive spirometer is a tool that can help keep your lungs clear and active. This tool measures how well you are  filling your lungs with each breath. Taking long deep breaths may help reverse or decrease the chance of developing breathing (pulmonary) problems (especially infection) following:  A long period of time when you are unable to move or be active. BEFORE THE PROCEDURE   If the spirometer includes an indicator to show your best effort, your nurse or respiratory therapist will set it to a desired goal.  If possible, sit up straight or lean slightly forward. Try not to slouch.  Hold the incentive spirometer in an upright position. INSTRUCTIONS FOR USE  1. Sit on the edge of your bed if possible,  or sit up as far as you can in bed or on a chair. 2. Hold the incentive spirometer in an upright position. 3. Breathe out normally. 4. Place the mouthpiece in your mouth and seal your lips tightly around it. 5. Breathe in slowly and as deeply as possible, raising the piston or the ball toward the top of the column. 6. Hold your breath for 3-5 seconds or for as long as possible. Allow the piston or ball to fall to the bottom of the column. 7. Remove the mouthpiece from your mouth and breathe out normally. 8. Rest for a few seconds and repeat Steps 1 through 7 at least 10 times every 1-2 hours when you are awake. Take your time and take a few normal breaths between deep breaths. 9. The spirometer may include an indicator to show your best effort. Use the indicator as a goal to work toward during each repetition. 10. After each set of 10 deep breaths, practice coughing to be sure your lungs are clear. If you have an incision (the cut made at the time of surgery), support your incision when coughing by placing a pillow or rolled up towels firmly against it. Once you are able to get out of bed, walk around indoors and cough well. You may stop using the incentive spirometer when instructed by your caregiver.  RISKS AND COMPLICATIONS  Take your time so you do not get dizzy or light-headed.  If you are in pain,  you may need to take or ask for pain medication before doing incentive spirometry. It is harder to take a deep breath if you are having pain. AFTER USE  Rest and breathe slowly and easily.  It can be helpful to keep track of a log of your progress. Your caregiver can provide you with a simple table to help with this. If you are using the spirometer at home, follow these instructions: [place] IF:   You are having difficultly using the spirometer.  You have trouble using the spirometer as often as instructed.  Your pain medication is not giving enough relief while using the spirometer.  You develop fever of 100.5 F (38.1 C) or higher. SEEK IMMEDIATE MEDICAL CARE IF:   You cough up bloody sputum that had not been present before.  You develop fever of 102 F (38.9 C) or greater.  You develop worsening pain at or near the incision site. MAKE SURE YOU:   Understand these instructions.  Will watch your condition.  Will get help right away if you are not doing well or get worse. Document Released: 10/01/2006 Document Revised: 08/13/2011 Document Reviewed: 12/02/2006 [facility] Patient Information 2014 [place], [place].   ________________________________________________________________________

## 2019-04-06 ENCOUNTER — Encounter (HOSPITAL_COMMUNITY): Payer: Self-pay

## 2019-04-06 ENCOUNTER — Encounter (HOSPITAL_COMMUNITY)
Admission: RE | Admit: 2019-04-06 | Discharge: 2019-04-06 | Disposition: A | Discharge status: Home / Self Care | Payer: Medicare Other | Source: Ambulatory Visit | Attending: Orthopedic Surgery | Admitting: Orthopedic Surgery

## 2019-04-06 ENCOUNTER — Other Ambulatory Visit: Payer: Self-pay

## 2019-04-06 DIAGNOSIS — Z87891 Personal history of nicotine dependence: Secondary | ICD-10-CM | POA: Insufficient documentation

## 2019-04-06 DIAGNOSIS — M1711 Unilateral primary osteoarthritis, right knee: Secondary | ICD-10-CM | POA: Insufficient documentation

## 2019-04-06 DIAGNOSIS — Z01812 Encounter for preprocedural laboratory examination: Secondary | ICD-10-CM | POA: Diagnosis not present

## 2019-04-06 DIAGNOSIS — Z79899 Other long term (current) drug therapy: Secondary | ICD-10-CM | POA: Insufficient documentation

## 2019-04-06 HISTORY — DX: Gout, unspecified: M10.9

## 2019-04-06 HISTORY — DX: Cardiac murmur, unspecified: R01.1

## 2019-04-06 LAB — BASIC METABOLIC PANEL
Anion gap: 9 (ref 5–15)
BUN: 20 mg/dL (ref 8–23)
CO2: 24 mmol/L (ref 22–32)
Calcium: 9.6 mg/dL (ref 8.9–10.3)
Chloride: 106 mmol/L (ref 98–111)
Creatinine, Ser: 0.88 mg/dL (ref 0.61–1.24)
GFR calc Af Amer: >60 mL/min (ref >60)
GFR calc non Af Amer: >60 mL/min (ref >60)
Glucose, Bld: 112 mg/dL — ABNORMAL HIGH (ref 70–99)
Potassium: 4.3 mmol/L (ref 3.5–5.1)
Sodium: 139 mmol/L (ref 135–145)

## 2019-04-06 LAB — CBC
HCT: 39.5 % (ref 39.0–52.0)
Hemoglobin: 13.5 g/dL (ref 13.0–17.0)
MCH: 33.4 pg (ref 26.0–34.0)
MCHC: 34.2 g/dL (ref 30.0–36.0)
MCV: 97.8 fL (ref 80.0–100.0)
Platelets: 137 10*3/uL — ABNORMAL LOW (ref 150–400)
RBC: 4.04 MIL/uL — ABNORMAL LOW (ref 4.22–5.81)
RDW: 12.8 % (ref 11.5–15.5)
WBC: 4.4 10*3/uL (ref 4.0–10.5)
nRBC: 0 % (ref 0.0–0.2)

## 2019-04-06 LAB — SURGICAL PCR SCREEN
MRSA, PCR: NEGATIVE
Staphylococcus aureus: POSITIVE — AB

## 2019-04-06 NOTE — Progress Notes (Signed)
PCP - Dr. Joyce Copa Cardiologist - Dr. Ashok Norris Last office visit note and cardiac clearance 12/25/2018 J. Hammond NP   Chest x-ray - N/A EKG - 12/25/2018 in epic Stress Test - N/A ECHO - 01/07/2019 cardiac clearance note in ECHO report in epic Cardiac Cath - N/A  Sleep Study - N/A CPAP - N/A  Fasting Blood Sugar - N/A Checks Blood Sugar __N/A___ times a day  Blood Thinner Instructions:  N/A Aspirin Instructions:  N/A Last Dose:  N/A  Anesthesia review: Aortic stenosis due to bicuspid aortic valve, Ascending aorta dilatation  Patient denies shortness of breath, fever, cough and chest pain at PAT appointment   Patient verbalized understanding of instructions that were given to them at the PAT appointment. Patient was also instructed that they will need to review over the PAT instructions again at home before surgery.

## 2019-04-06 NOTE — Progress Notes (Signed)
SPOKE W/  Garry     SCREENING SYMPTOMS OF COVID 19:   COUGH--NO  RUNNY NOSE--- NO  SORE THROAT---NO  NASAL CONGESTION----NO  SNEEZING----NO  SHORTNESS OF BREATH---NO  DIFFICULTY BREATHING---NO  TEMP >100.0 -----NO  UNEXPLAINED BODY ACHES------NO  CHILLS -------- NO  HEADACHES ---------NO  LOSS OF SMELL/ TASTE --------NO    HAVE YOU OR ANY FAMILY MEMBER TRAVELLED PAST 14 DAYS OUT OF THE   COUNTY---NO STATE----NO COUNTRY----NO  HAVE YOU OR ANY FAMILY MEMBER BEEN EXPOSED TO ANYONE WITH COVID 19? NO    

## 2019-04-06 NOTE — Progress Notes (Signed)
PCR results 04/06/2019 sent to Dr. Percell Miller via epic.

## 2019-04-07 ENCOUNTER — Other Ambulatory Visit (HOSPITAL_COMMUNITY): Payer: Medicare Other

## 2019-04-10 ENCOUNTER — Other Ambulatory Visit (HOSPITAL_COMMUNITY)
Admission: RE | Admit: 2019-04-10 | Discharge: 2019-04-10 | Disposition: A | Discharge status: Home / Self Care | Payer: Medicare Other | Source: Ambulatory Visit | Attending: Orthopedic Surgery | Admitting: Orthopedic Surgery

## 2019-04-10 DIAGNOSIS — Z01812 Encounter for preprocedural laboratory examination: Secondary | ICD-10-CM | POA: Diagnosis present

## 2019-04-10 DIAGNOSIS — Z20828 Contact with and (suspected) exposure to other viral communicable diseases: Secondary | ICD-10-CM | POA: Insufficient documentation

## 2019-04-10 NOTE — Anesthesia Preprocedure Evaluation (Deleted)
Anesthesia Evaluation    Reviewed: Allergy & Precautions, Patient's Chart, lab work & pertinent test results  Airway        Dental   Pulmonary former smoker,           Cardiovascular + Valvular Problems/Murmurs AI   Echo 01/07/2019: 1. The left ventricle has normal systolic function with an ejection fraction of 60-65%. The cavity size was normal. There is mildly increased left ventricular wall thickness. Left ventricular diastolic Doppler parameters are consistent with impaired  relaxation. 2. The right ventricle has normal systolic function. The cavity was normal. There is no increase in right ventricular wall thickness. 3. The tricuspid valve is grossly normal. 4. The aortic valve is tricuspid. Aortic valve regurgitation is moderate by color flow Doppler. 5. The aorta is abnormal in size and structure. 6. There is mild dilatation of the ascending aorta and of the aortic root. 7. The interatrial septum was not assessed.   Neuro/Psych negative neurological ROS     GI/Hepatic Neg liver ROS, GERD  Medicated,  Endo/Other  negative endocrine ROS  Renal/GU negative Renal ROS     Musculoskeletal  (+) Arthritis , Osteoarthritis,    Abdominal   Peds  Hematology negative hematology ROS (+) Plt 137k 04/06/2019   Anesthesia Other Findings Day of surgery medications reviewed with the patient.  Reproductive/Obstetrics                            Anesthesia Physical Anesthesia Plan  ASA: III  Anesthesia Plan: Spinal   Post-op Pain Management:  [place] for Post-op pain   Induction: Intravenous  PONV Risk Score and Plan: 1 and Propofol infusion and Treatment may vary due to age or medical condition  Airway Management Planned: Natural Airway and Nasal Cannula  Additional Equipment:   Intra-op Plan:   Post-operative Plan:   Informed Consent:   Plan Discussed with:   Anesthesia Plan  Comments: (See PAT note 04/06/2019, Konrad Felix, PA-C)       Anesthesia Quick Evaluation

## 2019-04-10 NOTE — Progress Notes (Signed)
Anesthesia Chart Review   Case: [id number] Date/Time: 04/14/19 0715   Procedure: UNICOMPARTMENTAL KNEE (Right )   Anesthesia type: Choice   Pre-op diagnosis: djd right knee   Location: [facility]   Surgeon: Marchia Bond, MD      DISCUSSION:73 y.o. former smoker (quit 07/12/80) with h/o GERD, mild AS, moderate aortic regurgitation, djd right knee scheduled for above procedure 04/14/2019 with Dr. Marchia Bond.   Last seen by cardiology 12/25/2018.  Seen by Daune Perch, NP.  Per OV note, "Pt with no history of CAD, heart failure, stroke, kidney disease or diabetes. He has aortic valve disease which is mild and not causing any symptoms. We will repeat an echocardiogram to ensure no progression of disease. He is clear for surgery at this point and can be scheduled for the future (he wishes surgery in November). If his echo is not stable we will let pt and Dr. Mardelle Matte know."  Repeat Echo 01/07/2019, per results, "Echo shows moderate aortic regurgitation, stable compared to prior echo. He can have his surgery.  I will also send this to Dr. Radford Pax for review."  Anticipate pt can proceed with planned procedure barring acute status change.   VS: BP 116/64   Pulse 61   Temp 36.6 C (Oral)   Resp 16   Ht 6\' 1"  (1.854 m)   Wt 89.8 kg   SpO2 99%   BMI 26.12 kg/m   PROVIDERS: Avva, Ravisankar, MD is PCP   Fransico Him, MD is Cardiologist  LABS: Labs reviewed: Acceptable for surgery. (all labs ordered are listed, but only abnormal results are displayed)  Labs Reviewed  SURGICAL PCR SCREEN - Abnormal; Notable for the following components:      Result Value   Staphylococcus aureus POSITIVE (*)    All other components within normal limits  BASIC METABOLIC PANEL - Abnormal; Notable for the following components:   Glucose, Bld 112 (*)    All other components within normal limits  CBC - Abnormal; Notable for the following components:   RBC 4.04 (*)    Platelets 137 (*)    All other  components within normal limits     IMAGES: CT Angio Chest 04/24/2018 IMPRESSION: 1. No evidence by CTA of aneurysmal disease of the thoracic aorta. Aortic measurements are within normal limits. There is minimal atherosclerosis at the level of the aortic arch and no evidence of aortic dissection or other pathology. 2. Suspect roughly 1 cm left thyroid nodule versus cyst. Consider correlation with thyroid ultrasound.  EKG: 12/25/2018 Rate 76 bpm Normal sinus rhythm  Minimal voltage criteria for LVH, may be normal variant  CV: Echo 01/07/2019 IMPRESSIONS   1. The left ventricle has normal systolic function with an ejection fraction of 60-65%. The cavity size was normal. There is mildly increased left ventricular wall thickness. Left ventricular diastolic Doppler parameters are consistent with impaired  relaxation.  2. The right ventricle has normal systolic function. The cavity was normal. There is no increase in right ventricular wall thickness.  3. The tricuspid valve is grossly normal.  4. The aortic valve is tricuspid. Aortic valve regurgitation is moderate by color flow Doppler.  5. The aorta is abnormal in size and structure.  6. There is mild dilatation of the ascending aorta and of the aortic root.  7. The interatrial septum was not assessed. Past Medical History:  Diagnosis Date  . Aortic regurgitation 01/07/2019   Moderate noted on ECHO  . 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 Arthritis   . Ascending aorta dilatation (HCC) 04/11/2018  . GERD (gastroesophageal reflux disease)   . Gout   . Heart murmur   . Hyperlipemia   . 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 COLONOSCOPY    . PILONIDAL CYST EXCISION    . SHOULDER ARTHROSCOPY Right 2008  . TOTAL SHOULDER ARTHROPLASTY Right 07/24/2016   Procedure:  TOTAL SHOULDER ARTHROPLASTY;  Surgeon: Marchia Bond, MD;  Location: [facility];  Service: Orthopedics;  Laterality: Right;  . UPPER GI ENDOSCOPY      MEDICATIONS: . acetaminophen (TYLENOL) 500 MG tablet  . allopurinol (ZYLOPRIM) 300 MG tablet  . buPROPion (WELLBUTRIN XL) 300 MG 24 hr tablet  . colchicine 0.6 MG tablet  . dexlansoprazole (DEXILANT) 60 MG capsule  . diphenhydramine-acetaminophen (TYLENOL PM) 25-500 MG TABS tablet  . fluticasone (FLONASE) 50 MCG/ACT nasal spray  . gabapentin (NEURONTIN) 300 MG capsule  . Glucosamine-Chondroitin (COSAMIN DS PO)  . loratadine (CLARITIN) 10 MG tablet  . Multiple Vitamin (MULTIVITAMIN WITH MINERALS) TABS tablet  . naproxen sodium (ALEVE) 220 MG tablet  . Omega-3 Fatty Acids (FISH OIL ULTRA) 1400 MG CAPS  . ranitidine (ZANTAC) 150 MG tablet  . saline (AYR) GEL  . SALINE NASAL MIST NA  . tamsulosin (FLOMAX) 0.4 MG CAPS capsule  . TURMERIC PO   No current facility-administered medications for this encounter.     Maia Plan [facility] Pre-Surgical Testing [phone] 04/10/19  1:43 PM

## 2019-04-11 LAB — NOVEL CORONAVIRUS, NAA (HOSP ORDER, SEND-OUT TO REF LAB; TAT 18-24 HRS): SARS-CoV-2, NAA: NOT DETECTED

## 2019-04-13 NOTE — Anesthesia Preprocedure Evaluation (Addendum)
Anesthesia Evaluation  Patient identified by MRN, date of birth, ID band Patient awake    Reviewed: Allergy & Precautions, NPO status , Patient's Chart, lab work & pertinent test results  Airway Mallampati: I       Dental no notable dental hx. (+) Teeth Intact   Pulmonary former smoker,    Pulmonary exam normal breath sounds clear to auscultation       Cardiovascular negative cardio ROS Normal cardiovascular exam Rhythm:Regular Rate:Normal     Neuro/Psych negative neurological ROS  negative psych ROS   GI/Hepatic Neg liver ROS, GERD  Medicated and Controlled,  Endo/Other  negative endocrine ROS  Renal/GU negative Renal ROS     Musculoskeletal  (+) Arthritis , Osteoarthritis,    Abdominal Normal abdominal exam  (+)   Peds  Hematology negative hematology ROS (+)   Anesthesia Other Findings Notes recorded by Mendel Ryder, CMA on 01/12/2019 at 9:22 AM EDT  Left message with results  ------   Notes recorded by Daune Perch, NP on 01/09/2019 at 4:29 PM EDT  Echo shows moderate aortic regurgitation, stable compared to prior echo. He can have his surgery.  I will also send this to Dr. Radford Pax for review.   Daune Perch, NP   Study Result  Result status: Final result    ECHOCARDIOGRAM REPORT       Patient Name:   Theodore Patton Date of Exam: 01/07/2019 Medical Rec #:  [id number]     Height:       73.0 in Accession #:    [id number]    Weight:       199.1 lb Date of Birth:  [birth year]     BSA:          2.15 m Patient Age:    73 years      BP:           126/72 mmHg Patient Gender: M             HR:           74 bpm. Exam Location:  [facility]    Procedure: 2D Echo, Cardiac Doppler and Color Doppler  Indications:    I35.1 AI   History:        Patient has prior history of Echocardiogram examinations, most                 recent 02/25/2018. Ascending aortic dilation Bicuspid aortic                  valve. Mild AS. AI Risk Factors: Former Smoker and Dyslipidemia.   Sonographer:    Coralyn Helling RDCS Referring Phys: [id number] Daune Perch    Sonographer Comments: Respiratory motion. IMPRESSIONS    1. The left ventricle has normal systolic function with an ejection fraction of 60-65%. The cavity size was normal. There is mildly increased left ventricular wall thickness. Left ventricular diastolic Doppler parameters are consistent with impaired  relaxation.  2. The right ventricle has normal systolic function. The cavity was normal. There is no increase in right ventricular wall thickness.  3. The tricuspid valve is grossly normal.  4. The aortic valve is tricuspid. Aortic valve regurgitation is moderate by color flow Doppler.  5. The aorta is abnormal in size and structure.  6. There is mild dilatation of the ascending aorta and of the aortic root.  7. The interatrial septum was not assessed.  FINDINGS  Left Ventricle: The left ventricle has normal systolic function,  with an ejection fraction of 60-65%. The cavity size was normal. There is mildly increased left ventricular wall thickness. Left ventricular diastolic Doppler parameters are consistent  with impaired relaxation.  Right Ventricle: The right ventricle has normal systolic function. The cavity was normal. There is no increase in right ventricular wall thickness.  Left Atrium: Left atrial size was normal in size.  Right Atrium: Right atrial size was normal in size. Right atrial pressure is estimated at 10 mmHg.  Interatrial Septum: The interatrial septum was not assessed.  Pericardium: There is no evidence of pericardial effusion.  Mitral Valve: The mitral valve is normal in structure. Mitral valve regurgitation is trivial by color flow Doppler.  Tricuspid Valve: The tricuspid valve is grossly normal. Tricuspid valve regurgitation is trivial by color flow Doppler.  Aortic Valve: The aortic valve is  tricuspid Aortic valve regurgitation is moderate by color flow Doppler.  Pulmonic Valve: The pulmonic valve was grossly normal. Pulmonic valve regurgitation is trivial by color flow Doppler.  Aorta: The aorta is abnormal in size and structure. There is mild dilatation of the ascending aorta and of the aortic root.    +--------------+--------++ LEFT VENTRICLE         +----------------+---------++ +--------------+--------++ Diastology                PLAX 2D                +----------------+---------++ +--------------+--------++ LV e' lateral:  8.92 cm/s LVIDd:        5.50 cm  +----------------+---------++ +--------------+--------++ LV E/e' lateral:5.2       LVIDs:        3.80 cm  +----------------+---------++ +--------------+--------++ LV e' medial:   5.55 cm/s LV PW:        1.30 cm  +----------------+---------++ +--------------+--------++ LV E/e' medial: 8.4       LV IVS:       1.30 cm  +----------------+---------++ +--------------+--------++ LVOT diam:    2.60 cm  +--------------+--------++ LV SV:        85 ml    +--------------+--------++ LV SV Index:  39.44    +--------------+--------++ LVOT Area:    5.31 cm +--------------+--------++                        +--------------+--------++  +---------------+---------++ RIGHT VENTRICLE          +---------------+---------++ RVSP:          29.7 mmHg +---------------+---------++  +---------------+-------++-----------++ LEFT ATRIUM           Index       +---------------+-------++-----------++ LA diam:       4.20 cm1.96 cm/m  +---------------+-------++-----------++ LA Vol (A2C):  52.0 ml24.21 ml/m +---------------+-------++-----------++ LA Vol (A4C):  60.2 ml28.03 ml/m +---------------+-------++-----------++ LA Biplane Vol:58.7 ml27.33 ml/m +---------------+-------++-----------++ +------------+---------+++ RIGHT  ATRIUM          +------------+---------+++ RA Pressure:3.00 mmHg +------------+---------+++  +------------------+------------++ AORTIC VALVE                   +------------------+------------++ AV Area (Vmax):   3.23 cm     +------------------+------------++ AV Area (Vmean):  3.45 cm     +------------------+------------++ AV Area (VTI):    3.36 cm     +------------------+------------++ AV Vmax:          200.50 cm/s  +------------------+------------++ AV Vmean:         122.500 cm/s +------------------+------------++ AV VTI:           0.356 m      +------------------+------------++  AV Peak Grad:     16.1 mmHg    +------------------+------------++ AV Mean Grad:     7.0 mmHg     +------------------+------------++ LVOT Vmax:        122.00 cm/s  +------------------+------------++ LVOT Vmean:       79.600 cm/s  +------------------+------------++ LVOT VTI:         0.225 m      +------------------+------------++ LVOT/AV VTI ratio:0.63         +------------------+------------++ AR PHT:           386 msec     +------------------+------------++   +-------------+-------++ AORTA                +-------------+-------++ Ao Root diam:3.90 cm +-------------+-------++ Ao Asc diam: 3.57 cm +-------------+-------++  +--------------+--------++   +---------------+-----------++ MITRAL VALVE             TRICUSPID VALVE            +--------------+--------++   +---------------+-----------++ MV Area (PHT):           TR Peak grad:  26.7 mmHg   +--------------+--------++   +---------------+-----------++                          TR Vmax:       259.00 cm/s +--------------+--------++   +---------------+-----------++ MV Decel Time:343 msec   Estimated RAP: 3.00 mmHg   +--------------+--------++   +---------------+-----------++ +--------------+----------++ RVSP:          29.7 mmHg    MV E velocity:46.50 cm/s +---------------+-----------++ +--------------+----------++ MV A velocity:85.90 cm/s +--------------+-------+ +--------------+----------++ SHUNTS                MV E/A ratio: 0.54       +--------------+-------+ +--------------+----------++ Systemic VTI: 0.22 m                               +--------------+-------+                              Systemic Diam:2.60 cm                              +--------------+-------+  +---------+-------+ IVC              +---------+-------+ IVC diam:1.40 cm +---------+-------+    Mertie Moores MD Electronically signed by Mertie Moores MD Signature Date/Time: 01/07/2019/5:56:32 PM       Final   Syngo Images  Show images for ECHOCARDIOGRAM COMPLETE Images on [employer]  Show images for Elizebeth Brooking Performing Technologist/Nurse  Performing Technologist/Nurse: Elyse Jarvis Reason for Exam Priority: Routine Dx: 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  Other Surgical History   Procedure Laterality Date Comment Source COLONOSCOPY    Provider PILONIDAL CYST EXCISION    Provider SHOULDER ARTHROSCOPY Right 2008  Provider TOTAL SHOULDER ARTHROPLASTY Right 07/24/2016 Procedure: TOTAL SHOULDER ARTHROPLASTY; Surgeon: Marchia Bond, MD; Location: [facility]; Service: Orthopedics; Laterality: Right; Provider UPPER GI ENDOSCOPY    Provider  Implants   Implant Adonis Huguenin Total 2 - [id number] - Capitated Charge Entry (Right) Shoulder  Inventory item: CAPT SHLDR TOTAL 2 Model/Cat number: [id number] Manufacturer: [employer]   As of 07/24/2016  Status: Capitated Charge Entry    Order-Level Documents:  There are no order-level documents. Encounter-Level Documents - 01/07/2019:  Electronic signature on 01/07/2019 3:40 PM -  E-signed Electronic signature on 01/07/2019 3:39 PM - E-signed   Resulted by:  Signed Date/Time  Phone Pager Thayer Headings 01/07/2019 5:56 PM [phone]  External Result Report   External Result Report      Reproductive/Obstetrics                           Anesthesia Physical Anesthesia Plan  ASA: II  Anesthesia Plan: Spinal   Post-op Pain Management:  Regional for Post-op pain   Induction:   PONV Risk Score and Plan: 1 and Ondansetron, Dexamethasone and Midazolam  Airway Management Planned: Nasal Cannula, Simple Face Mask and Natural Airway  Additional Equipment: None  Intra-op Plan:   Post-operative Plan:   Informed Consent: I have reviewed the patients History and Physical, chart, labs and discussed the procedure including the risks, benefits and alternatives for the proposed anesthesia with the patient or authorized representative who has indicated his/her understanding and acceptance.       Plan Discussed with: CRNA  Anesthesia Plan Comments:        Anesthesia Quick Evaluation

## 2019-04-14 ENCOUNTER — Encounter (HOSPITAL_COMMUNITY): Payer: Self-pay | Admitting: *Deleted

## 2019-04-14 ENCOUNTER — Ambulatory Visit (HOSPITAL_COMMUNITY): Payer: Medicare Other | Admitting: Anesthesiology

## 2019-04-14 ENCOUNTER — Other Ambulatory Visit: Payer: Self-pay

## 2019-04-14 ENCOUNTER — Observation Stay (HOSPITAL_COMMUNITY)
Admission: RE | Admit: 2019-04-14 | Discharge: 2019-04-15 | Disposition: A | Discharge status: Home / Self Care | Payer: Medicare Other | Attending: Orthopedic Surgery | Admitting: Orthopedic Surgery

## 2019-04-14 ENCOUNTER — Ambulatory Visit (HOSPITAL_COMMUNITY): Payer: Medicare Other | Admitting: Physician Assistant

## 2019-04-14 ENCOUNTER — Encounter (HOSPITAL_COMMUNITY)
Admission: RE | Disposition: A | Discharge status: Home / Self Care | Payer: Self-pay | Source: Home / Self Care | Attending: Orthopedic Surgery

## 2019-04-14 ENCOUNTER — Observation Stay (HOSPITAL_COMMUNITY): Payer: Medicare Other

## 2019-04-14 DIAGNOSIS — Z79899 Other long term (current) drug therapy: Secondary | ICD-10-CM | POA: Insufficient documentation

## 2019-04-14 DIAGNOSIS — K219 Gastro-esophageal reflux disease without esophagitis: Secondary | ICD-10-CM | POA: Diagnosis not present

## 2019-04-14 DIAGNOSIS — Z87891 Personal history of nicotine dependence: Secondary | ICD-10-CM | POA: Diagnosis not present

## 2019-04-14 DIAGNOSIS — Z96651 Presence of right artificial knee joint: Secondary | ICD-10-CM

## 2019-04-14 DIAGNOSIS — Z882 Allergy status to sulfonamides status: Secondary | ICD-10-CM | POA: Insufficient documentation

## 2019-04-14 DIAGNOSIS — M109 Gout, unspecified: Secondary | ICD-10-CM | POA: Insufficient documentation

## 2019-04-14 DIAGNOSIS — Z96611 Presence of right artificial shoulder joint: Secondary | ICD-10-CM | POA: Insufficient documentation

## 2019-04-14 DIAGNOSIS — Z791 Long term (current) use of non-steroidal anti-inflammatories (NSAID): Secondary | ICD-10-CM | POA: Diagnosis not present

## 2019-04-14 DIAGNOSIS — M1711 Unilateral primary osteoarthritis, right knee: Secondary | ICD-10-CM | POA: Diagnosis not present

## 2019-04-14 HISTORY — DX: Unilateral primary osteoarthritis, right knee: M17.11

## 2019-04-14 HISTORY — PX: PARTIAL KNEE ARTHROPLASTY: SHX2174

## 2019-04-14 SURGERY — ARTHROPLASTY, KNEE, UNICOMPARTMENTAL
Anesthesia: Spinal | Site: Knee | Laterality: Right

## 2019-04-14 MED ORDER — PROPOFOL 10 MG/ML IV BOLUS
INTRAVENOUS | Status: AC
  Filled 2019-04-14: qty 60

## 2019-04-14 MED ORDER — BUPIVACAINE IN DEXTROSE 0.75-8.25 % IT SOLN: INTRATHECAL | Status: DC | PRN

## 2019-04-14 MED ORDER — KETOROLAC TROMETHAMINE 15 MG/ML IJ SOLN: 15.0000 mg | Freq: Once | INTRAMUSCULAR | Status: DC

## 2019-04-14 MED ORDER — BUPIVACAINE HCL 0.25 % IJ SOLN
INTRAMUSCULAR | Status: DC | PRN
  Administered 2019-04-14: 30 mL

## 2019-04-14 MED ORDER — FENTANYL CITRATE (PF) 100 MCG/2ML IJ SOLN
INTRAMUSCULAR | Status: DC | PRN
  Administered 2019-04-14: 100 ug via INTRAVENOUS

## 2019-04-14 MED ORDER — ONDANSETRON HCL 4 MG PO TABS: 4.0000 mg | ORAL_TABLET | Freq: Three times a day (TID) | ORAL | 0 refills | Status: DC | PRN

## 2019-04-14 MED ORDER — 0.9 % SODIUM CHLORIDE (POUR BTL) OPTIME
TOPICAL | Status: DC | PRN
  Administered 2019-04-14: 1000 mL

## 2019-04-14 MED ORDER — ONDANSETRON HCL 4 MG PO TABS
4.0000 mg | ORAL_TABLET | Freq: Four times a day (QID) | ORAL | Status: DC | PRN
  Filled 2019-04-14: qty 1

## 2019-04-14 MED ORDER — HYDROMORPHONE HCL 1 MG/ML IJ SOLN: 0.2500 mg | INTRAMUSCULAR | Status: DC | PRN

## 2019-04-14 MED ORDER — DIPHENHYDRAMINE HCL 12.5 MG/5ML PO ELIX: 12.5000 mg | ORAL_SOLUTION | ORAL | Status: DC | PRN

## 2019-04-14 MED ORDER — ALLOPURINOL 300 MG PO TABS
300.0000 mg | ORAL_TABLET | Freq: Every day | ORAL | Status: DC
  Administered 2019-04-15: 11:00:00 300 mg via ORAL
  Filled 2019-04-14: qty 1

## 2019-04-14 MED ORDER — ALUM & MAG HYDROXIDE-SIMETH 200-200-20 MG/5ML PO SUSP: 30.0000 mL | ORAL | Status: DC | PRN

## 2019-04-14 MED ORDER — PHENOL 1.4 % MT LIQD: 1.0000 | OROMUCOSAL | Status: DC | PRN

## 2019-04-14 MED ORDER — POTASSIUM CHLORIDE IN NACL 20-0.45 MEQ/L-% IV SOLN
INTRAVENOUS | Status: DC
  Administered 2019-04-14: 12:00:00 via INTRAVENOUS
  Filled 2019-04-14 (×3): qty 1000

## 2019-04-14 MED ORDER — PROMETHAZINE HCL 25 MG/ML IJ SOLN: 6.2500 mg | INTRAMUSCULAR | Status: DC | PRN

## 2019-04-14 MED ORDER — KETOROLAC TROMETHAMINE 15 MG/ML IJ SOLN
7.5000 mg | Freq: Four times a day (QID) | INTRAMUSCULAR | Status: AC
  Administered 2019-04-14 – 2019-04-15 (×4): 7.5 mg via INTRAVENOUS
  Filled 2019-04-14 (×4): qty 1

## 2019-04-14 MED ORDER — CLONIDINE HCL (ANALGESIA) 100 MCG/ML EP SOLN
EPIDURAL | Status: DC | PRN
  Administered 2019-04-14: 100 ug

## 2019-04-14 MED ORDER — OXYCODONE HCL 5 MG PO TABS: 5.0000 mg | ORAL_TABLET | ORAL | 0 refills | Status: DC | PRN

## 2019-04-14 MED ORDER — BUPROPION HCL ER (XL) 300 MG PO TB24
300.0000 mg | ORAL_TABLET | Freq: Every day | ORAL | Status: DC
  Administered 2019-04-15: 11:00:00 300 mg via ORAL
  Filled 2019-04-14: qty 1

## 2019-04-14 MED ORDER — MAGNESIUM CITRATE PO SOLN: 1.0000 | Freq: Once | ORAL | Status: DC | PRN

## 2019-04-14 MED ORDER — ONDANSETRON HCL 4 MG/2ML IJ SOLN
INTRAMUSCULAR | Status: DC | PRN
  Administered 2019-04-14: 4 mg via INTRAVENOUS

## 2019-04-14 MED ORDER — SODIUM CHLORIDE 0.9 % IR SOLN
Status: DC | PRN
  Administered 2019-04-14: 1000 mL

## 2019-04-14 MED ORDER — CEFAZOLIN SODIUM-DEXTROSE 2-4 GM/100ML-% IV SOLN
2.0000 g | Freq: Four times a day (QID) | INTRAVENOUS | Status: AC
  Administered 2019-04-14 (×2): 2 g via INTRAVENOUS
  Filled 2019-04-14 (×2): qty 100

## 2019-04-14 MED ORDER — BUPIVACAINE HCL (PF) 0.25 % IJ SOLN
INTRAMUSCULAR | Status: AC
  Filled 2019-04-14: qty 30

## 2019-04-14 MED ORDER — GLUCOSAMINE-CHONDROITIN 500-400 MG PO TABS: 1.0000 | ORAL_TABLET | Freq: Two times a day (BID) | ORAL | Status: DC

## 2019-04-14 MED ORDER — ASPIRIN EC 325 MG PO TBEC
325.0000 mg | DELAYED_RELEASE_TABLET | Freq: Two times a day (BID) | ORAL | Status: DC
  Administered 2019-04-14 – 2019-04-15 (×2): 325 mg via ORAL
  Filled 2019-04-14 (×2): qty 1

## 2019-04-14 MED ORDER — FLUTICASONE PROPIONATE 50 MCG/ACT NA SUSP: 1.0000 | Freq: Every evening | NASAL | Status: DC | PRN

## 2019-04-14 MED ORDER — METHOCARBAMOL 500 MG IVPB - SIMPLE MED
500.0000 mg | Freq: Four times a day (QID) | INTRAVENOUS | Status: DC | PRN
  Filled 2019-04-14: qty 50

## 2019-04-14 MED ORDER — SENNA-DOCUSATE SODIUM 8.6-50 MG PO TABS: 2.0000 | ORAL_TABLET | Freq: Every day | ORAL | 1 refills | Status: DC

## 2019-04-14 MED ORDER — ASPIRIN EC 325 MG PO TBEC: 325.0000 mg | DELAYED_RELEASE_TABLET | Freq: Two times a day (BID) | ORAL | 0 refills | Status: DC

## 2019-04-14 MED ORDER — HYDROCODONE-ACETAMINOPHEN 5-325 MG PO TABS
1.0000 | ORAL_TABLET | ORAL | Status: DC | PRN
  Administered 2019-04-14 (×2): 2 via ORAL
  Administered 2019-04-15: 1 via ORAL
  Filled 2019-04-14 (×3): qty 2

## 2019-04-14 MED ORDER — ACETAMINOPHEN 500 MG PO TABS
500.0000 mg | ORAL_TABLET | Freq: Four times a day (QID) | ORAL | Status: AC
  Administered 2019-04-14 – 2019-04-15 (×3): 500 mg via ORAL
  Filled 2019-04-14 (×3): qty 1

## 2019-04-14 MED ORDER — METOCLOPRAMIDE HCL 5 MG/ML IJ SOLN
5.0000 mg | Freq: Three times a day (TID) | INTRAMUSCULAR | Status: DC | PRN
  Administered 2019-04-14 – 2019-04-15 (×2): 10 mg via INTRAVENOUS
  Filled 2019-04-14 (×2): qty 2

## 2019-04-14 MED ORDER — TAMSULOSIN HCL 0.4 MG PO CAPS
0.4000 mg | ORAL_CAPSULE | Freq: Every day | ORAL | Status: DC
  Administered 2019-04-14: 21:00:00 0.4 mg via ORAL
  Filled 2019-04-14: qty 1

## 2019-04-14 MED ORDER — TRANEXAMIC ACID-NACL 1000-0.7 MG/100ML-% IV SOLN
INTRAVENOUS | Status: DC | PRN
  Administered 2019-04-14: 1000 mg via INTRAVENOUS

## 2019-04-14 MED ORDER — GABAPENTIN 300 MG PO CAPS
600.0000 mg | ORAL_CAPSULE | Freq: Three times a day (TID) | ORAL | Status: DC
  Administered 2019-04-14 – 2019-04-15 (×3): 600 mg via ORAL
  Filled 2019-04-14 (×3): qty 2

## 2019-04-14 MED ORDER — COLCHICINE 0.6 MG PO TABS: 0.6000 mg | ORAL_TABLET | Freq: Four times a day (QID) | ORAL | Status: DC | PRN

## 2019-04-14 MED ORDER — AYR SALINE NASAL NA GEL
1.0000 "application " | Freq: Four times a day (QID) | NASAL | Status: DC | PRN
  Filled 2019-04-14: qty 14.1

## 2019-04-14 MED ORDER — PANTOPRAZOLE SODIUM 40 MG PO TBEC
40.0000 mg | DELAYED_RELEASE_TABLET | Freq: Every day | ORAL | Status: DC
  Administered 2019-04-14 – 2019-04-15 (×2): 40 mg via ORAL
  Filled 2019-04-14 (×2): qty 1

## 2019-04-14 MED ORDER — FENTANYL CITRATE (PF) 100 MCG/2ML IJ SOLN
INTRAMUSCULAR | Status: AC
  Filled 2019-04-14: qty 2

## 2019-04-14 MED ORDER — ROPIVACAINE HCL 7.5 MG/ML IJ SOLN
INTRAMUSCULAR | Status: DC | PRN
  Administered 2019-04-14 (×4): 5 mL via PERINEURAL

## 2019-04-14 MED ORDER — DEXAMETHASONE SODIUM PHOSPHATE 10 MG/ML IJ SOLN
10.0000 mg | Freq: Once | INTRAMUSCULAR | Status: AC
  Administered 2019-04-15: 11:00:00 10 mg via INTRAVENOUS
  Filled 2019-04-14: qty 1

## 2019-04-14 MED ORDER — BACLOFEN 10 MG PO TABS: 10.0000 mg | ORAL_TABLET | Freq: Three times a day (TID) | ORAL | 0 refills | Status: DC

## 2019-04-14 MED ORDER — METOCLOPRAMIDE HCL 5 MG PO TABS: 5.0000 mg | ORAL_TABLET | Freq: Three times a day (TID) | ORAL | Status: DC | PRN

## 2019-04-14 MED ORDER — BISACODYL 10 MG RE SUPP: 10.0000 mg | Freq: Every day | RECTAL | Status: DC | PRN

## 2019-04-14 MED ORDER — ROPIVACAINE HCL 5 MG/ML IJ SOLN
INTRAMUSCULAR | Status: DC | PRN
  Administered 2019-04-14 (×2): 5 mL via PERINEURAL

## 2019-04-14 MED ORDER — TRANEXAMIC ACID-NACL 1000-0.7 MG/100ML-% IV SOLN
INTRAVENOUS | Status: AC
  Filled 2019-04-14: qty 100

## 2019-04-14 MED ORDER — KETOROLAC TROMETHAMINE 30 MG/ML IJ SOLN
INTRAMUSCULAR | Status: DC | PRN
  Administered 2019-04-14: 30 mg

## 2019-04-14 MED ORDER — HYDROCODONE-ACETAMINOPHEN 7.5-325 MG PO TABS
1.0000 | ORAL_TABLET | ORAL | Status: DC | PRN
  Administered 2019-04-14 – 2019-04-15 (×2): 2 via ORAL
  Filled 2019-04-14 (×2): qty 2

## 2019-04-14 MED ORDER — LORATADINE 10 MG PO TABS
10.0000 mg | ORAL_TABLET | Freq: Every day | ORAL | Status: DC
  Administered 2019-04-14: 10 mg via ORAL
  Filled 2019-04-14: qty 1

## 2019-04-14 MED ORDER — CHLORHEXIDINE GLUCONATE 4 % EX LIQD: 60.0000 mL | Freq: Once | CUTANEOUS | Status: DC

## 2019-04-14 MED ORDER — ADULT MULTIVITAMIN W/MINERALS CH
1.0000 | ORAL_TABLET | Freq: Every day | ORAL | Status: DC
  Administered 2019-04-15: 11:00:00 1 via ORAL
  Filled 2019-04-14: qty 1

## 2019-04-14 MED ORDER — ONDANSETRON HCL 4 MG/2ML IJ SOLN
4.0000 mg | Freq: Four times a day (QID) | INTRAMUSCULAR | Status: DC | PRN
  Administered 2019-04-14 – 2019-04-15 (×2): 4 mg via INTRAVENOUS
  Filled 2019-04-14 (×2): qty 2

## 2019-04-14 MED ORDER — POVIDONE-IODINE 10 % EX SWAB
2.0000 "application " | Freq: Once | CUTANEOUS | Status: AC
  Administered 2019-04-14: 2 via TOPICAL

## 2019-04-14 MED ORDER — DOCUSATE SODIUM 100 MG PO CAPS
100.0000 mg | ORAL_CAPSULE | Freq: Two times a day (BID) | ORAL | Status: DC
  Administered 2019-04-14 – 2019-04-15 (×2): 100 mg via ORAL
  Filled 2019-04-14 (×2): qty 1

## 2019-04-14 MED ORDER — FAMOTIDINE 20 MG PO TABS: 10.0000 mg | ORAL_TABLET | ORAL | Status: DC | PRN

## 2019-04-14 MED ORDER — MEPERIDINE HCL 50 MG/ML IJ SOLN: 6.2500 mg | INTRAMUSCULAR | Status: DC | PRN

## 2019-04-14 MED ORDER — BUPIVACAINE IN DEXTROSE 0.75-8.25 % IT SOLN
INTRATHECAL | Status: DC | PRN
  Administered 2019-04-14: 1.6 mL via INTRATHECAL

## 2019-04-14 MED ORDER — TRANEXAMIC ACID-NACL 1000-0.7 MG/100ML-% IV SOLN
1000.0000 mg | Freq: Once | INTRAVENOUS | Status: AC
  Administered 2019-04-14: 11:00:00 1000 mg via INTRAVENOUS
  Filled 2019-04-14: qty 100

## 2019-04-14 MED ORDER — PROPOFOL 500 MG/50ML IV EMUL
INTRAVENOUS | Status: DC | PRN
  Administered 2019-04-14: 30 mg via INTRAVENOUS

## 2019-04-14 MED ORDER — ZOLPIDEM TARTRATE 5 MG PO TABS: 5.0000 mg | ORAL_TABLET | Freq: Every evening | ORAL | Status: DC | PRN

## 2019-04-14 MED ORDER — ACETAMINOPHEN 325 MG PO TABS: 325.0000 mg | ORAL_TABLET | Freq: Four times a day (QID) | ORAL | Status: DC | PRN

## 2019-04-14 MED ORDER — KETOROLAC TROMETHAMINE 30 MG/ML IJ SOLN
INTRAMUSCULAR | Status: AC
  Filled 2019-04-14: qty 1

## 2019-04-14 MED ORDER — MORPHINE SULFATE (PF) 2 MG/ML IV SOLN: 0.5000 mg | INTRAVENOUS | Status: DC | PRN

## 2019-04-14 MED ORDER — PROPOFOL 500 MG/50ML IV EMUL
INTRAVENOUS | Status: DC | PRN
  Administered 2019-04-14: 75 ug/kg/min via INTRAVENOUS

## 2019-04-14 MED ORDER — MENTHOL 3 MG MT LOZG: 1.0000 | LOZENGE | OROMUCOSAL | Status: DC | PRN

## 2019-04-14 MED ORDER — POLYETHYLENE GLYCOL 3350 17 G PO PACK: 17.0000 g | PACK | Freq: Every day | ORAL | Status: DC | PRN

## 2019-04-14 MED ORDER — ACETAMINOPHEN 500 MG PO TABS
1000.0000 mg | ORAL_TABLET | Freq: Once | ORAL | Status: AC
  Administered 2019-04-14: 06:00:00 1000 mg via ORAL
  Filled 2019-04-14: qty 2

## 2019-04-14 MED ORDER — LACTATED RINGERS IV SOLN
INTRAVENOUS | Status: DC
  Administered 2019-04-14 (×3): via INTRAVENOUS

## 2019-04-14 MED ORDER — OMEGA-3-ACID ETHYL ESTERS 1 G PO CAPS: 1000.0000 mg | ORAL_CAPSULE | Freq: Every day | ORAL | Status: DC

## 2019-04-14 MED ORDER — CEFAZOLIN SODIUM-DEXTROSE 2-4 GM/100ML-% IV SOLN
2.0000 g | INTRAVENOUS | Status: AC
  Administered 2019-04-14: 2 g via INTRAVENOUS
  Filled 2019-04-14: qty 100

## 2019-04-14 MED ORDER — PROPOFOL 10 MG/ML IV BOLUS
INTRAVENOUS | Status: AC
  Filled 2019-04-14: qty 40

## 2019-04-14 MED ORDER — METHOCARBAMOL 500 MG PO TABS
500.0000 mg | ORAL_TABLET | Freq: Four times a day (QID) | ORAL | Status: DC | PRN
  Administered 2019-04-14 – 2019-04-15 (×2): 500 mg via ORAL
  Filled 2019-04-14 (×2): qty 1

## 2019-04-14 SURGICAL SUPPLY — 71 items
ARCOM OXFORD UNICOMPART ×3 IMPLANT
BAG SPEC THK2 15X12 ZIP CLS (MISCELLANEOUS) ×1
BAG ZIPLOCK 12X15 (MISCELLANEOUS) ×3 IMPLANT
BANDAGE ESMARK 6X9 LF (GAUZE/BANDAGES/DRESSINGS) ×1 IMPLANT
BLADE SURG 15 STRL LF DISP TIS (BLADE) ×1 IMPLANT
BLADE SURG 15 STRL SS (BLADE) ×3
BNDG CMPR 9X6 STRL LF SNTH (GAUZE/BANDAGES/DRESSINGS) ×1
BNDG CMPR MED 15X6 ELC VLCR LF (GAUZE/BANDAGES/DRESSINGS) ×1
BNDG ELASTIC 6X15 VLCR STRL LF (GAUZE/BANDAGES/DRESSINGS) ×3 IMPLANT
BNDG ESMARK 6X9 LF (GAUZE/BANDAGES/DRESSINGS) ×3
BOWL SMART MIX CTS (DISPOSABLE) ×3 IMPLANT
BRNG TIB LRG 4 PHS 3 RT MEN ×1 IMPLANT
CEMENT BONE R 1X40 (Cement) ×3 IMPLANT
CLOSURE STERI-STRIP 1/2X4 (GAUZE/BANDAGES/DRESSINGS) ×1
CLSR STERI-STRIP ANTIMIC 1/2X4 (GAUZE/BANDAGES/DRESSINGS) ×2 IMPLANT
COVER SURGICAL LIGHT HANDLE (MISCELLANEOUS) ×3 IMPLANT
COVER WAND RF STERILE (DRAPES) IMPLANT
CUFF TOURN SGL QUICK 34 (TOURNIQUET CUFF) ×3
CUFF TRNQT CYL 34X4.125X (TOURNIQUET CUFF) ×1 IMPLANT
DECANTER SPIKE VIAL GLASS SM (MISCELLANEOUS) IMPLANT
DRAPE EXTREMITY T 121X128X90 (DISPOSABLE) ×3 IMPLANT
DRAPE POUCH INSTRU U-SHP 10X18 (DRAPES) ×3 IMPLANT
DRAPE SHEET LG 3/4 BI-LAMINATE (DRAPES) ×3 IMPLANT
DRAPE U-SHAPE 47X51 STRL (DRAPES) ×3 IMPLANT
DRSG MEPILEX BORDER 4X8 (GAUZE/BANDAGES/DRESSINGS) ×3 IMPLANT
DRSG PAD ABDOMINAL 8X10 ST (GAUZE/BANDAGES/DRESSINGS) ×3 IMPLANT
DURAPREP 26ML APPLICATOR (WOUND CARE) ×6 IMPLANT
ELECT REM PT RETURN 15FT ADLT (MISCELLANEOUS) ×3 IMPLANT
FACESHIELD WRAPAROUND (MASK) ×3 IMPLANT
FACESHIELD WRAPAROUND OR TEAM (MASK) ×1 IMPLANT
GLOVE BIO SURGEON STRL SZ7.5 (GLOVE) ×3 IMPLANT
GLOVE BIOGEL PI IND STRL 8 (GLOVE) ×2 IMPLANT
GLOVE BIOGEL PI INDICATOR 8 (GLOVE) ×4
GLOVE SURG ORTHO 8.0 STRL STRW (GLOVE) ×3 IMPLANT
GOWN STRL REUS W/TWL 2XL LVL3 (GOWN DISPOSABLE) ×3 IMPLANT
GOWN STRL REUS W/TWL LRG LVL3 (GOWN DISPOSABLE) ×3 IMPLANT
HANDPIECE INTERPULSE COAX TIP (DISPOSABLE) ×3
HOLDER FOLEY CATH W/STRAP (MISCELLANEOUS) IMPLANT
HOOD PEEL AWAY FLYTE STAYCOOL (MISCELLANEOUS) ×6 IMPLANT
IMMOBILIZER KNEE 20 (SOFTGOODS)
IMMOBILIZER KNEE 20 THIGH 36 (SOFTGOODS) IMPLANT
IMMOBILIZER KNEE 22 UNIV (SOFTGOODS) ×3 IMPLANT
KIT BASIN OR (CUSTOM PROCEDURE TRAY) ×3 IMPLANT
KIT TURNOVER KIT A (KITS) IMPLANT
KNEE OXFORD ARCOM UNICOMPART ×1 IMPLANT
NDL SAFETY ECLIPSE 18X1.5 (NEEDLE) ×1 IMPLANT
NEEDLE HYPO 18GX1.5 SHARP (NEEDLE) ×3
NS IRRIG 1000ML POUR BTL (IV SOLUTION) ×3 IMPLANT
PACK BLADE SAW RECIP 70 3 PT (BLADE) ×3 IMPLANT
PACK ICE MAXI GEL EZY WRAP (MISCELLANEOUS) ×3 IMPLANT
PACK TOTAL JOINT (CUSTOM PROCEDURE TRAY) ×3 IMPLANT
PEG FEMORAL CEMENT STRL LRG (Knees) ×3 IMPLANT
PROTECTOR NERVE ULNAR (MISCELLANEOUS) ×3 IMPLANT
SET HNDPC FAN SPRY TIP SCT (DISPOSABLE) ×1 IMPLANT
SUCTION FRAZIER HANDLE 12FR (TUBING) ×2
SUCTION TUBE FRAZIER 12FR DISP (TUBING) ×1 IMPLANT
SUT ETHIBOND NAB CT1 #1 30IN (SUTURE) ×2 IMPLANT
SUT MNCRL AB 4-0 PS2 18 (SUTURE) ×3 IMPLANT
SUT VIC AB 0 CT1 36 (SUTURE) ×3 IMPLANT
SUT VIC AB 2-0 CT1 27 (SUTURE) ×3
SUT VIC AB 2-0 CT1 TAPERPNT 27 (SUTURE) ×1 IMPLANT
SUT VIC AB 3-0 SH 8-18 (SUTURE) ×3 IMPLANT
SYR 20ML LL LF (SYRINGE) ×3 IMPLANT
SYR 30ML LL (SYRINGE) ×3 IMPLANT
SYR 3ML LL SCALE MARK (SYRINGE) ×3 IMPLANT
TAPE STRIPS DRAPE STRL (GAUZE/BANDAGES/DRESSINGS) ×2 IMPLANT
TOWEL OR 17X26 10 PK STRL BLUE (TOWEL DISPOSABLE) ×3 IMPLANT
TOWEL OR NON WOVEN STRL DISP B (DISPOSABLE) ×3 IMPLANT
TRAY FOLEY MTR SLVR 16FR STAT (SET/KITS/TRAYS/PACK) ×3 IMPLANT
TRAY TIBIAL OXFORD SZ C RT (Joint) ×2 IMPLANT
WATER STERILE IRR 1000ML POUR (IV SOLUTION) ×3 IMPLANT

## 2019-04-14 NOTE — Progress Notes (Signed)
PHARMACIST - PHYSICIAN ORDER COMMUNICATION  CONCERNING: P&T Medication Policy on Herbal Medications  DESCRIPTION:  This patient's order for:  Glucosamine-chondroitin  has been noted.  This product(s) is classified as an "herbal" or natural product. Due to a lack of definitive safety studies or FDA approval, nonstandard manufacturing practices, plus the potential risk of unknown drug-drug interactions while on inpatient medications, the Pharmacy and Therapeutics Committee does not permit the use of "herbal" or natural products of this type within [place].   ACTION TAKEN: The pharmacy department is unable to verify this order at this time and your patient has been informed of this safety policy. Please reevaluate patient's clinical condition at discharge and address if the herbal or natural product(s) should be resumed at that time.   

## 2019-04-14 NOTE — Anesthesia Procedure Notes (Signed)
Spinal  Patient location during procedure: OR Start time: 04/14/2019 7:30 AM End time: 04/14/2019 7:35 AM Staffing Anesthesiologist: Lyn Hollingshead, MD Performed: anesthesiologist  Preanesthetic Checklist Completed: patient identified, site marked, surgical consent, pre-op evaluation, timeout performed, IV checked, risks and benefits discussed and monitors and equipment checked Spinal Block Patient position: sitting Prep: site prepped and draped and DuraPrep Patient monitoring: continuous pulse ox and blood pressure Approach: midline Location: L3-4 Injection technique: single-shot Needle Needle type: Pencan  Needle gauge: 24 G Needle length: 10 cm Needle insertion depth: 6 cm Assessment Sensory level: T8

## 2019-04-14 NOTE — Transfer of Care (Signed)
Immediate Anesthesia Transfer of Care Note  Patient: DESJUAN CROSSLIN  Procedure(s) Performed: UNICOMPARTMENTAL KNEE (Right Knee)  Patient Location: PACU  Anesthesia Type:Spinal  Level of Consciousness: awake, alert  and oriented  Airway & Oxygen Therapy: Patient Spontanous Breathing and Patient connected to face mask oxygen  Post-op Assessment: Report given to RN and Post -op Vital signs reviewed and stable  Post vital signs: Reviewed and stable  Last Vitals:  Vitals Value Taken Time  BP    Temp    Pulse 71 04/14/19 0943  Resp 15 04/14/19 0943  SpO2 97 % 04/14/19 0943  Vitals shown include unvalidated device data.  Last Pain:  Vitals:   04/14/19 0550  TempSrc: Oral         Complications: No apparent anesthesia complications

## 2019-04-14 NOTE — H&P (Signed)
PREOPERATIVE H&P  Chief Complaint: right knee oa  HPI: Theodore Patton is a 73 y.o. male who presents for preoperative history and physical with a diagnosis of right knee oa. Symptoms are rated as moderate to severe, and have been worsening.  This is significantly impairing activities of daily living.  He has elected for surgical management.   He has failed injections, activity modification, anti-inflammatories, and assistive devices.  Preoperative X-rays demonstrate end stage degenerative changes with osteophyte formation, loss of joint space, subchondral sclerosis.   Past Medical History:  Diagnosis Date  . Aortic regurgitation 01/07/2019   Moderate noted on ECHO  . 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 Arthritis   . Ascending aorta dilatation (HCC) 04/11/2018  . GERD (gastroesophageal reflux disease)   . Gout   . Heart murmur   . Hyperlipemia   . Primary localized osteoarthrosis of right shoulder 07/24/2016   Past Surgical History:  Procedure Laterality Date  . COLONOSCOPY    . PILONIDAL CYST EXCISION    . SHOULDER ARTHROSCOPY Right 2008  . TOTAL SHOULDER ARTHROPLASTY Right 07/24/2016   Procedure: TOTAL SHOULDER ARTHROPLASTY;  Surgeon: Marchia Bond, MD;  Location: [facility];  Service: Orthopedics;  Laterality: Right;  . UPPER GI ENDOSCOPY     Social History   Socioeconomic History  . Marital status: Married    Spouse name: Not on file  . Number of children: Not on file  . Years of education: Not on file  . Highest education level: Not on file  Occupational History  . Not on file  Social Needs  . Financial resource strain: Not on file  . Food insecurity    Worry: Not on file    Inability: Not on file  . Transportation needs    Medical: Not on file    Non-medical: Not on file  Tobacco Use  . Smoking status: Former Smoker    Types: Cigarettes    Quit date: 07/12/1980    Years since  quitting: 38.7  . Smokeless tobacco: Never Used  Substance and Sexual Activity  . Alcohol use: Yes    Alcohol/week: 1.0 standard drinks    Types: 1 Cans of beer per week    Comment: occ  . Drug use: No  . Sexual activity: Not on file  Lifestyle  . Physical activity    Days per week: Not on file    Minutes per session: Not on file  . Stress: Not on file  Relationships  . Social Herbalist on phone: Not on file    Gets together: Not on file    Attends religious service: Not on file    Active member of club or organization: Not on file    Attends meetings of clubs or organizations: Not on file    Relationship status: Not on file  Other Topics Concern  . Not on file  Social History Narrative  . Not on file   Family History  Problem Relation Age of Onset  . Heart Problems Sister        possibly cause of death, unknown specifics  . Liver cancer Brother        as an infant  . Healthy Sister    Allergies  Allergen Reactions  . Sulfa Antibiotics Other (See Comments)    Blisters in mouth   Prior to Admission medications   Medication Sig Start Date  End Date Taking? Authorizing Provider  acetaminophen (TYLENOL) 500 MG tablet Take 1,000 mg by mouth 3 (three) times daily.    Yes [provider]  allopurinol (ZYLOPRIM) 300 MG tablet Take 300 mg by mouth daily. 05/16/16  Yes [provider]  buPROPion (WELLBUTRIN XL) 300 MG 24 hr tablet Take 300 mg by mouth daily. 06/15/16  Yes [provider]  diphenhydramine-acetaminophen (TYLENOL PM) 25-500 MG TABS tablet Take 2 tablets by mouth at bedtime.   Yes [provider]  gabapentin (NEURONTIN) 300 MG capsule Take 600 mg by mouth 3 (three) times daily. 04/03/16  Yes [provider]  Glucosamine-Chondroitin (COSAMIN DS PO) Take 1 tablet by mouth 2 (two) times daily.    Yes [provider]  Multiple Vitamin (MULTIVITAMIN WITH MINERALS) TABS tablet Take 1 tablet by mouth daily.  Complete Multivitamin   Yes [provider]  naproxen sodium (ALEVE) 220 MG tablet Take 440 mg by mouth 2 (two) times daily.   Yes [provider]  Omega-3 Fatty Acids (FISH OIL ULTRA) 1400 MG CAPS Take 1,400 mg by mouth daily.   Yes [provider]  saline (AYR) GEL Place 1 application into the nose every 6 (six) hours as needed (nasal congestion.).   Yes [provider]  SALINE NASAL MIST NA Place 1 spray into the nose 3 (three) times daily as needed (congestion).   Yes [provider]  TURMERIC PO Take 1,000 mg by mouth 2 (two) times daily.    Yes [provider]  colchicine 0.6 MG tablet Take 0.6 mg by mouth every 6 (six) hours as needed (gout flares).  01/26/19   [provider]  dexlansoprazole (DEXILANT) 60 MG capsule Take 60 mg by mouth daily as needed (acid reflux/indigestion.).    [provider]  fluticasone (FLONASE) 50 MCG/ACT nasal spray Place 1-2 sprays into both nostrils at bedtime as needed. For allergies/nasal congestion 06/11/16   [provider]  loratadine (CLARITIN) 10 MG tablet Take 10 mg by mouth at bedtime.    [provider]  ranitidine (ZANTAC) 150 MG tablet Take 150 mg by mouth 2 (two) times daily as needed for heartburn (acid reflux/indigestion.).    [provider]  tamsulosin (FLOMAX) 0.4 MG CAPS capsule Take 0.4 mg by mouth at bedtime. 05/16/16   [provider]     Positive ROS: All other systems have been reviewed and were otherwise negative with the exception of those mentioned in the HPI and as above.  Physical Exam: General: Alert, no acute distress Cardiovascular: No pedal edema Respiratory: No cyanosis, no use of accessory musculature GI: No organomegaly, abdomen is soft and non-tender Skin: No lesions in the area of chief complaint Neurologic: Sensation intact distally Psychiatric: Patient is competent for consent with normal mood and  affect Lymphatic: No axillary or cervical lymphadenopathy  MUSCULOSKELETAL: right knee painful arc of motion, pseudolaxity  Assessment: Right knee oa anteromedial   Plan: Plan for Procedure(s): UNICOMPARTMENTAL KNEE  The risks benefits and alternatives were discussed with the patient including but not limited to the risks of nonoperative treatment, versus surgical intervention including infection, bleeding, nerve injury,  blood clots, cardiopulmonary complications, morbidity, mortality, among others, and they were willing to proceed.    Patient's anticipated LOS is less than 2 midnights, meeting these requirements: - Younger than 64 - Lives within 1 hour of care - Has a competent adult at home to recover with post-op recover - NO history of  - Chronic  pain requiring opiods  - Diabetes  - Coronary Artery Disease  - Heart failure  - Heart attack  - Stroke  - DVT/VTE  - Cardiac arrhythmia  - Respiratory Failure/COPD  - Renal failure  - Anemia  - Advanced Liver disease        Johnny Bridge, MD Cell [phone]   04/14/2019 7:21 AM

## 2019-04-14 NOTE — Anesthesia Procedure Notes (Signed)
Anesthesia Regional Block: Adductor canal block   Pre-Anesthetic Checklist: ,, timeout performed, Correct Patient, Correct Site, Correct Laterality, Correct Procedure, Correct Position, site marked, Risks and benefits discussed,  Surgical consent,  Pre-op evaluation,  At surgeon's request and post-op pain management  Laterality: Lower and Right  Prep: chloraprep       Needles:  Injection technique: Single-shot  Needle Type: Echogenic Stimulator Needle     Needle Length: 10cm  Needle Gauge: 21   Needle insertion depth: 2 cm   Additional Needles:   Procedures:,,,, ultrasound used (permanent image in chart),,,,  Narrative:  Start time: 04/14/2019 6:55 AM End time: 04/14/2019 7:05 AM Injection made incrementally with aspirations every 5 mL.  Performed by: Personally  Anesthesiologist: Lyn Hollingshead, MD

## 2019-04-14 NOTE — Plan of Care (Signed)
Continue current POC 

## 2019-04-14 NOTE — Anesthesia Postprocedure Evaluation (Signed)
Anesthesia Post Note  Patient: Theodore Patton  Procedure(s) Performed: UNICOMPARTMENTAL KNEE (Right Knee)     Patient location during evaluation: PACU Anesthesia Type: Spinal Level of consciousness: awake Pain management: pain level controlled Vital Signs Assessment: post-procedure vital signs reviewed and stable Respiratory status: spontaneous breathing Cardiovascular status: stable Postop Assessment: no headache, no backache, spinal receding, patient able to bend at knees and no apparent nausea or vomiting Anesthetic complications: no    Last Vitals:  Vitals:   04/14/19 0945 04/14/19 1000  BP: 111/62 130/61  Pulse: 64 62  Resp: 16 13  Temp:  (!) 36.3 C  SpO2: 100% 98%    Last Pain:  Vitals:   04/14/19 1000  TempSrc:   PainSc: 0-No pain   Pain Goal:    LLE Motor Response: Purposeful movement, Responds to commands (04/14/19 1000) LLE Sensation: Numbness (04/14/19 1000) RLE Motor Response: Purposeful movement, Responds to commands (04/14/19 1000) RLE Sensation: Numbness (04/14/19 1000) L Sensory Level: S1-Sole of foot, small toes (04/14/19 1000) R Sensory Level: S1-Sole of foot, small toes (04/14/19 1000) Epidural/Spinal Function Cutaneous sensation: Able to Wiggle Toes (04/14/19 1000), Patient able to flex knees: Yes (04/14/19 1000), Patient able to lift hips off bed: No (04/14/19 1000), Back pain beyond tenderness at insertion site: No (04/14/19 1000), Progressively worsening motor and/or sensory loss: No (04/14/19 1000), Bowel and/or bladder incontinence post epidural: No (04/14/19 1000)  Huston Foley

## 2019-04-14 NOTE — Op Note (Signed)
04/14/2019  9:06 AM  PATIENT:  Elizebeth Brooking    PRE-OPERATIVE DIAGNOSIS: Right knee anteromedial osteoarthritis  POST-OPERATIVE DIAGNOSIS:  Same  PROCEDURE: RIGHT unicompartmental Knee Arthroplasty  SURGEON:  Johnny Bridge, MD  PHYSICIAN ASSISTANT: Chriss Czar, PA-C present and scrubbed throughout the case, critical for completion in a timely fashion, and for retraction, instrumentation, and closure.  ANESTHESIA:   Spinal  ESTIMATED BLOOD LOSS: 75 mL  UNIQUE ASPECTS OF THE CASE: He had significant varus disease, with eburnation, bone loss, and his ACL was present although did have a little bit of mucoid degeneration, there was some slight osteophyte formation within the notch.  The MCL was present and functional at the end of the case, and he was a good 4, a 5 was going to be too tight.  The size C matched appropriately from front to back, may have had just a slight bit of overhang medial to lateral.  The B however given his large femur seemed to be is too small, particularly given the size of the resected piece from the tibia.Marland Kitchen  PREOPERATIVE INDICATIONS:  Theodore Patton is a  73 y.o. male with a diagnosis of djd right knee who failed conservative measures and elected for surgical management.    The risks benefits and alternatives were discussed with the patient preoperatively including but not limited to the risks of infection, bleeding, nerve injury, cardiopulmonary complications, blood clots, the need for revision surgery, among others, and the patient was willing to proceed.  OPERATIVE IMPLANTS: [employer] mobile bearing medial compartment arthroplasty femur size large, tibia size C, bearing size 4.  OPERATIVE FINDINGS: Endstage grade 4 medial compartment osteoarthritis. No significant changes in the lateral or patellofemoral joint.  The ACL was intact.  OPERATIVE PROCEDURE: The patient was brought to the operating room placed in the supine position. General anesthesia was  administered. IV antibiotics were given. The lower extremity was placed in the legholder and prepped and draped in usual sterile fashion.  Time out was performed.  The leg was elevated and exsanguinated and the tourniquet was inflated. Anteromedial incision was performed, and I took care to preserve the MCL. Parapatellar incision was carried out, and the osteophytes were excised, along with the medial meniscus and a small portion of the fat pad.  The extra medullary tibial cutting jig was applied, using the spoon and the 48mm G-Clamp and the 2 mm shim, and I took care to protect the anterior cruciate ligament insertion and the tibial spine. The medial collateral ligament was also protected, and I resected my proximal tibia, matching the anatomic slope.   The proximal tibial bony cut was removed in one piece, and I turned my attention to the femur.  The intramedullary femoral rod was placed using the drill, and then using the appropriate reference, I assembled the femoral jig, setting my posterior cutting block. I resected my posterior femur, used the 0 spigot for the anterior femur, and then measured my gap.   I then used the appropriate mill to match the extension gap to the flexion gap. The second milling was at a 2.  The gaps were then measured again with the appropriate feeler gauges. Once I had balanced flexion and extension gaps, I then completed the preparation of the femur.  The initial measurement was a 4 in flexion and a 2 in extension, and I used a 2 mm spigot.  I milled off the anterior aspect of the distal femur to prevent impingement. I also exposed  the tibia, and selected the above-named component, and then used the cutting jig to prepare the keel slot on the tibia. I also used the awl to curette out the bone to complete the preparation of the keel. The back wall was intact.  I then placed trial components, and it was found to have excellent motion, and appropriate balance.  I then  cemented the components into place, cementing the tibia first, removing all excess cement, and then cementing the femur.  All loose cement was removed.  The real polyethylene insert was applied manually, and the knee was taken through functional range of motion, and found to have excellent stability and restoration of joint motion, with excellent balance.  The wounds were irrigated copiously, and the parapatellar tissue closed with Vicryl, followed by Vicryl for the subcutaneous tissue, with routine closure with Steri-Strips and sterile gauze.  The tourniquet was released, and the patient was awakened and extubated and returned to PACU in stable and satisfactory condition. There were no complications.

## 2019-04-14 NOTE — Evaluation (Addendum)
Physical Therapy Evaluation Patient Details Name: Theodore Patton MRN: [id number] DOB: [birth year] Today's Date: 04/14/2019   History of Present Illness  Patient is 73 y.o. male s/p Rt unicompartmental Knee Arthroplasty on 04/14/19 with PMH significant for HLD, GERD, OA, and Rt total shoulder.  Clinical Impression  Theodore Patton is a 73 y.o. male POD 0 s/p Rt unicompartmental Knee Arthroplasty. Patient reports independence with mobility at baseline. Patient is now limited by functional impairments (see PT problem list below) and requires min assist for transfers and gait with RW. Patient was able to ambulate ~60 feet with RW and assist to mange walker safely and facilitate Rt knee extension with tactile cue as he fatigued. Patient instructed in exercise to facilitate ROM and circulation. Patient will benefit from continued skilled PT interventions to address impairments and progress towards PLOF. Acute PT will follow to progress mobility and stair training in preparation for safe discharge home.     Follow Up Recommendations Follow surgeon's recommendation for DC plan and follow-up therapies    Equipment Recommendations  Rolling walker with 5" wheels    Recommendations for Other Services       Precautions / Restrictions Precautions Precautions: Fall Restrictions Weight Bearing Restrictions: No RLE Weight Bearing: Weight bearing as tolerated      Mobility  Bed Mobility Overal bed mobility: Needs Assistance Bed Mobility: Supine to Sit     Supine to sit: Supervision;HOB elevated     General bed mobility comments: cues for use of bed rail, no assist required  Transfers Overall transfer level: Needs assistance Equipment used: Rolling walker (2 wheeled) Transfers: Sit to/from Stand Sit to Stand: From elevated surface;Min assist         General transfer comment: cues for hand placmeent and technique with RW, assist required to block Rt knee and facilitate extension to stand, no  assist required for power up  Ambulation/Gait Ambulation/Gait assistance: Min assist Gait Distance (Feet): 60 Feet Assistive device: Rolling walker (2 wheeled) Gait Pattern/deviations: Step-through pattern;Decreased step length - left;Decreased stance time - left;Decreased stride length;Decreased weight shift to right Gait velocity: decreased   General Gait Details: ceus for safe hand placement and start and for step pattern while maintaining safe proximity to [employer] Mobility    Modified Rankin (Stroke Patients Only)       Balance Overall balance assessment: Needs assistance Sitting-balance support: Feet supported;No upper extremity supported Sitting balance-Leahy Scale: Good     Standing balance support: During functional activity;Bilateral upper extremity supported Standing balance-Leahy Scale: Poor Standing balance comment: pt reliant on UE support due to reduced Rt quad activation                Pertinent Vitals/Pain Pain Assessment: No/denies pain    Home Living Family/patient expects to be discharged to:: Private residence Living Arrangements: Spouse/significant other Available Help at Discharge: Available 24 hours/day;Family Type of Home: House Home Access: Stairs to enter Entrance Stairs-Rails: Right Entrance Stairs-Number of Steps: 4 Home Layout: One level Home Equipment: Grab bars - tub/shower;Cane - single point(walking stick)      Prior Function Level of Independence: Independent               Hand Dominance   Dominant Hand: Right    Extremity/Trunk Assessment   Upper Extremity Assessment Upper Extremity Assessment: Overall WFL for tasks assessed    Lower Extremity Assessment Lower Extremity Assessment: RLE deficits/detail RLE Deficits /  Details: pt with good quad set and minimal extensor lag with SLR, pt with 4-/5 for quad strength with MMT RLE Sensation: WNL RLE Coordination: WNL    Cervical /  Trunk Assessment Cervical / Trunk Assessment: Normal  Communication   Communication: No difficulties  Cognition Arousal/Alertness: Awake/alert Behavior During Therapy: WFL for tasks assessed/performed Overall Cognitive Status: Within Functional Limits for tasks assessed           General Comments      Exercises Total Joint Exercises Ankle Circles/Pumps: AROM;10 reps;Seated;Both Quad Sets: AROM;5 reps;Supine;Right Heel Slides: AAROM;10 reps;Seated;Right   Assessment/Plan    PT Assessment Patient needs continued PT services  PT Problem List Decreased strength;Decreased range of motion;Decreased activity tolerance;Decreased balance;Decreased mobility;Decreased cognition;Decreased knowledge of use of DME       PT Treatment Interventions DME instruction;Functional mobility training;Patient/family education;Balance training;Modalities;Gait training;Therapeutic activities;Stair training;Therapeutic exercise    PT Goals (Current goals can be found in the Care Plan section)  Acute Rehab PT Goals Patient Stated Goal: get back to independence PT Goal Formulation: With patient Time For Goal Achievement: 04/21/19 Potential to Achieve Goals: Good    Frequency 7X/week    AM-PAC PT "6 Clicks" Mobility  Outcome Measure Help needed turning from your back to your side while in a flat bed without using bedrails?: A Little Help needed moving from lying on your back to sitting on the side of a flat bed without using bedrails?: A Little Help needed moving to and from a bed to a chair (including a wheelchair)?: A Little Help needed standing up from a chair using your arms (e.g., wheelchair or bedside chair)?: A Little Help needed to walk in hospital room?: A Little Help needed climbing 3-5 steps with a railing? : A Little 6 Click Score: 18    End of Session Equipment Utilized During Treatment: Gait belt Activity Tolerance: Patient tolerated treatment well Patient left: in chair;with call  bell/phone within reach;with chair alarm set;with family/visitor present Nurse Communication: Mobility status PT Visit Diagnosis: Muscle weakness (generalized) (M62.81);Difficulty in walking, not elsewhere classified (R26.2)    Time: [id number] PT Time Calculation (min) (ACUTE ONLY): 31 min   Charges:   PT Evaluation $PT Eval Low Complexity: 1 Low PT Treatments $Therapeutic Exercise: 8-22 mins        Kipp Brood, PT, DPT Physical Therapist with [facility]  04/14/2019 2:00 PM

## 2019-04-14 NOTE — Discharge Instructions (Signed)

## 2019-04-15 DIAGNOSIS — M1711 Unilateral primary osteoarthritis, right knee: Secondary | ICD-10-CM | POA: Diagnosis not present

## 2019-04-15 LAB — CBC
HCT: 32.4 % — ABNORMAL LOW (ref 39.0–52.0)
Hemoglobin: 11 g/dL — ABNORMAL LOW (ref 13.0–17.0)
MCH: 33.3 pg (ref 26.0–34.0)
MCHC: 34 g/dL (ref 30.0–36.0)
MCV: 98.2 fL (ref 80.0–100.0)
Platelets: 115 10*3/uL — ABNORMAL LOW (ref 150–400)
RBC: 3.3 MIL/uL — ABNORMAL LOW (ref 4.22–5.81)
RDW: 12.4 % (ref 11.5–15.5)
WBC: 6.6 10*3/uL (ref 4.0–10.5)
nRBC: 0 % (ref 0.0–0.2)

## 2019-04-15 LAB — BASIC METABOLIC PANEL
Anion gap: 8 (ref 5–15)
BUN: 15 mg/dL (ref 8–23)
CO2: 24 mmol/L (ref 22–32)
Calcium: 8.2 mg/dL — ABNORMAL LOW (ref 8.9–10.3)
Chloride: 102 mmol/L (ref 98–111)
Creatinine, Ser: 0.97 mg/dL (ref 0.61–1.24)
GFR calc Af Amer: >60 mL/min (ref >60)
GFR calc non Af Amer: >60 mL/min (ref >60)
Glucose, Bld: 114 mg/dL — ABNORMAL HIGH (ref 70–99)
Potassium: 3.9 mmol/L (ref 3.5–5.1)
Sodium: 134 mmol/L — ABNORMAL LOW (ref 135–145)

## 2019-04-15 NOTE — Progress Notes (Signed)
Physical Therapy Treatment Patient Details Name: Theodore Patton MRN: [id number] DOB: 05-23-1946 Today's Date: 04/15/2019    History of Present Illness Patient is 73 y.o. male s/p Rt unicompartmental Knee Arthroplasty on 04/14/19 with PMH significant for HLD, GERD, OA, and Rt total shoulder.    PT Comments    Pt motivated however declined OOB d/t nausea this am. Reviewed knee HEP and pt tolerated very well despite not having had pain meds. Will see again in pm. Has 4 steps with one rail   Follow Up Recommendations  Follow surgeon's recommendation for DC plan and follow-up therapies     Equipment Recommendations  Rolling walker with 5" wheels    Recommendations for Other Services       Precautions / Restrictions Precautions Precautions: Fall Restrictions Weight Bearing Restrictions: No RLE Weight Bearing: Weight bearing as tolerated    Mobility  Bed Mobility               General bed mobility comments: pt declined d/t nausea  Transfers                    Ambulation/Gait                 Stairs             Wheelchair Mobility    Modified Rankin (Stroke Patients Only)       Balance                                            Cognition Arousal/Alertness: Awake/alert Behavior During Therapy: WFL for tasks assessed/performed Overall Cognitive Status: Within Functional Limits for tasks assessed                                        Exercises Total Joint Exercises Ankle Circles/Pumps: AROM;Both;10 reps Quad Sets: AROM;Both;10 reps Heel Slides: AAROM;AROM;Right;10 reps Hip ABduction/ADduction: AROM;Right;10 reps Straight Leg Raises: AROM;Right;10 reps    General Comments        Pertinent Vitals/Pain Pain Assessment: 0-10 Pain Score: 3  Pain Location: left knee Pain Descriptors / Indicators: Discomfort Pain Intervention(s): Limited activity within patient's tolerance;Monitored during session     Home Living                      Prior Function            PT Goals (current goals can now be found in the care plan section) Acute Rehab PT Goals Patient Stated Goal: get back to independence PT Goal Formulation: With patient Time For Goal Achievement: 04/21/19 Potential to Achieve Goals: Good Progress towards PT goals: Progressing toward goals(limited by nausea)    Frequency    7X/week      PT Plan Current plan remains appropriate    Co-evaluation              AM-PAC PT "6 Clicks" Mobility   Outcome Measure  Help needed turning from your back to your side while in a flat bed without using bedrails?: A Little Help needed moving from lying on your back to sitting on the side of a flat bed without using bedrails?: A Little Help needed moving to and from a bed to a chair (including a wheelchair)?: A Little Help  needed standing up from a chair using your arms (e.g., wheelchair or bedside chair)?: A Little Help needed to walk in hospital room?: A Little Help needed climbing 3-5 steps with a railing? : A Little 6 Click Score: 18    End of Session Equipment Utilized During Treatment: Gait belt Activity Tolerance: Patient tolerated treatment well Patient left: in bed;with call bell/phone within reach;with bed alarm set;with family/visitor present Nurse Communication: Mobility status PT Visit Diagnosis: Muscle weakness (generalized) (M62.81);Difficulty in walking, not elsewhere classified (R26.2)     Time: [id number] PT Time Calculation (min) (ACUTE ONLY): 10 min  Charges:  $Therapeutic Exercise: 8-22 mins                     Kenyon Ana, PT  Pager: [phone] Acute Rehab Dept [facility]): [id number]   04/15/2019    [facility] 04/15/2019, 11:49 AM

## 2019-04-15 NOTE — Discharge Summary (Signed)
Physician Discharge Summary  Patient ID: Theodore Patton MRN: [id number] DOB/AGE: [birth year] 70 y.o.  Admit date: 04/14/2019 Discharge date: 04/15/2019  Admission Diagnoses:  Primary localized osteoarthritis of right knee  Discharge Diagnoses:  Principal Problem:   Primary localized osteoarthritis of right knee Active Problems:   Status post right partial knee replacement   Past Medical History:  Diagnosis Date  . Aortic regurgitation 01/07/2019   Moderate noted on ECHO  . 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 Arthritis   . Ascending aorta dilatation (HCC) 04/11/2018  . GERD (gastroesophageal reflux disease)   . Gout   . Heart murmur   . Hyperlipemia   . Primary localized osteoarthritis of right knee 04/14/2019  . Primary localized osteoarthrosis of right shoulder 07/24/2016    Surgeries: Procedure(s): UNICOMPARTMENTAL KNEE on 04/14/2019   Consultants (if any):   Discharged Condition: Improved  Hospital Course: Theodore Patton is an 73 y.o. male who was admitted 04/14/2019 with a diagnosis of Primary localized osteoarthritis of right knee and went to the operating room on 04/14/2019 and underwent the above named procedures.    He was given perioperative antibiotics:  Anti-infectives (From admission, onward)   Start     Dose/Rate Route Frequency Ordered Stop   04/14/19 1400  ceFAZolin (ANCEF) IVPB 2g/100 mL premix     2 g 200 mL/hr over 30 Minutes Intravenous Every 6 hours 04/14/19 1043 04/14/19 2014   04/14/19 0600  ceFAZolin (ANCEF) IVPB 2g/100 mL premix     2 g 200 mL/hr over 30 Minutes Intravenous On call to O.R. 04/14/19 [id number] 04/14/19 0818    .  He was given sequential compression devices, early ambulation, and aspirin for DVT prophylaxis.  He benefited maximally from the hospital stay and there were no complications.    Recent vital signs:  Vitals:   04/15/19 0131 04/15/19  0519  BP: 135/63 128/68  Pulse: 70 72  Resp: 14   Temp: 99.1 F (37.3 C) 98.2 F (36.8 C)  SpO2: 96% 96%    Recent laboratory studies:  Lab Results  Component Value Date   HGB 11.0 (L) 04/15/2019   HGB 13.5 04/06/2019   HGB 15.2 12/17/2016   Lab Results  Component Value Date   WBC 6.6 04/15/2019   PLT 115 (L) 04/15/2019   No results found for: INR Lab Results  Component Value Date   NA 134 (L) 04/15/2019   K 3.9 04/15/2019   CL 102 04/15/2019   CO2 24 04/15/2019   BUN 15 04/15/2019   CREATININE 0.97 04/15/2019   GLUCOSE 114 (H) 04/15/2019    Discharge Medications:   Allergies as of 04/15/2019      Reactions   Sulfa Antibiotics Other (See Comments)   Blisters in mouth      Medication List    TAKE these medications   acetaminophen 500 MG tablet Commonly known as: TYLENOL Take 1,000 mg by mouth 3 (three) times daily.   allopurinol 300 MG tablet Commonly known as: ZYLOPRIM Take 300 mg by mouth daily.   aspirin EC 325 MG tablet Take 1 tablet (325 mg total) by mouth 2 (two) times daily.   baclofen 10 MG tablet Commonly known as: LIORESAL Take 1 tablet (10 mg total) by mouth 3 (three) times daily. As needed for muscle spasm   buPROPion 300 MG 24 hr tablet Commonly known as: WELLBUTRIN XL Take 300 mg by  mouth daily.   colchicine 0.6 MG tablet Take 0.6 mg by mouth every 6 (six) hours as needed (gout flares).   COSAMIN DS PO Take 1 tablet by mouth 2 (two) times daily.   Dexilant 60 MG capsule Generic drug: dexlansoprazole Take 60 mg by mouth daily as needed (acid reflux/indigestion.).   diphenhydramine-acetaminophen 25-500 MG Tabs tablet Commonly known as: TYLENOL PM Take 2 tablets by mouth at bedtime.   Fish Oil Ultra 1400 MG Caps Take 1,400 mg by mouth daily.   fluticasone 50 MCG/ACT nasal spray Commonly known as: FLONASE Place 1-2 sprays into both nostrils at bedtime as needed. For allergies/nasal congestion   gabapentin 300 MG  capsule Commonly known as: NEURONTIN Take 600 mg by mouth 3 (three) times daily.   loratadine 10 MG tablet Commonly known as: CLARITIN Take 10 mg by mouth at bedtime.   multivitamin with minerals Tabs tablet Take 1 tablet by mouth daily. Complete Multivitamin   naproxen sodium 220 MG tablet Commonly known as: ALEVE Take 440 mg by mouth 2 (two) times daily.   ondansetron 4 MG tablet Commonly known as: Zofran Take 1 tablet (4 mg total) by mouth every 8 (eight) hours as needed for nausea or vomiting.   oxyCODONE 5 MG immediate release tablet Commonly known as: Roxicodone Take 1 tablet (5 mg total) by mouth every 4 (four) hours as needed for severe pain.   ranitidine 150 MG tablet Commonly known as: ZANTAC Take 150 mg by mouth 2 (two) times daily as needed for heartburn (acid reflux/indigestion.).   SALINE NASAL MIST NA Place 1 spray into the nose 3 (three) times daily as needed (congestion).   saline Gel Place 1 application into the nose every 6 (six) hours as needed (nasal congestion.).   sennosides-docusate sodium 8.6-50 MG tablet Commonly known as: SENOKOT-S Take 2 tablets by mouth daily.   tamsulosin 0.4 MG Caps capsule Commonly known as: FLOMAX Take 0.4 mg by mouth at bedtime.   TURMERIC PO Take 1,000 mg by mouth 2 (two) times daily.            Discharge Care Instructions  (From admission, onward)         Start     Ordered   04/14/19 0000  Weight bearing as tolerated     04/14/19 [id number]          Diagnostic Studies: Dg Knee Right Port  Result Date: 04/14/2019 CLINICAL DATA:  Status post right knee surgery. EXAM: PORTABLE RIGHT KNEE - 1-2 VIEW COMPARISON:  None. FINDINGS: Status post right medial hemiarthroplasty. The femoral and tibial components appear to be well situated. Expected postoperative changes are noted in the soft tissues anteriorly. No fracture or dislocation is noted. IMPRESSION: Status post right medial hemiarthroplasty. Electronically  Signed   By: Marijo Conception M.D.   On: 04/14/2019 10:26    Disposition:   Discharge Instructions    Weight bearing as tolerated   Complete by: As directed       Follow-up Information    Marchia Bond, MD. Schedule an appointment as soon as possible for a visit in 2 weeks.   Specialty: Orthopedic Surgery Contact information: [address] [place] [address] [phone]            Signed: Johnny Bridge 04/15/2019, 7:42 AM

## 2019-04-15 NOTE — Progress Notes (Signed)
Physical Therapy Treatment Patient Details Name: Theodore Patton MRN: [id number] DOB: 1945/06/06 Today's Date: 04/15/2019    History of Present Illness Patient is 73 y.o. male s/p Rt unicompartmental Knee Arthroplasty on 04/14/19 with PMH significant for HLD, GERD, OA, and Rt total shoulder.    PT Comments    Pt  Progressing well this pm. Nausea improved, able to amb in hallway and review stairs. Ready for d/c from PT standpoint  Follow Up Recommendations  Follow surgeon's recommendation for DC plan and follow-up therapies     Equipment Recommendations  Rolling walker with 5" wheels    Recommendations for Other Services     Precautions / Restrictions Precautions Precautions: Fall Restrictions RLE Weight Bearing: Weight bearing as tolerated    Mobility  Bed Mobility Overal bed mobility: Needs Assistance Bed Mobility: Supine to Sit     Supine to sit: Supervision;HOB elevated     General bed mobility comments: for safety  Transfers Overall transfer level: Needs assistance Equipment used: Rolling walker (2 wheeled) Transfers: Sit to/from Stand Sit to Stand: Min guard         General transfer comment: cues for hand placement, supervision from chair and elevated bed  Ambulation/Gait Ambulation/Gait assistance: Supervision;Min guard Gait Distance (Feet): 80 Feet Assistive device: Rolling walker (2 wheeled) Gait Pattern/deviations: Step-to pattern;Step-through pattern;Decreased stance time - right Gait velocity: decreased   General Gait Details: cues for sequence initially, RW distance  and gait progression   Stairs Stairs: Yes Stairs assistance: Min guard Stair Management: One rail Right;One rail Left;Step to pattern;Sideways Number of Stairs: 5 General stair comments: cues for sequence and safe technique   Wheelchair Mobility    Modified Rankin (Stroke Patients Only)       Balance                                             Cognition Arousal/Alertness: Awake/alert Behavior During Therapy: WFL for tasks assessed/performed Overall Cognitive Status: Within Functional Limits for tasks assessed                                        Exercises Total Joint Exercises Ankle Circles/Pumps: AROM;Both;10 reps    General Comments        Pertinent Vitals/Pain Pain Assessment: 0-10 Pain Score: 4  Pain Location: left knee Pain Descriptors / Indicators: Discomfort Pain Intervention(s): Limited activity within patient's tolerance;Monitored during session;Repositioned    Home Living                      Prior Function            PT Goals (current goals can now be found in the care plan section) Acute Rehab PT Goals Patient Stated Goal: get back to independence PT Goal Formulation: With patient Time For Goal Achievement: 04/21/19 Potential to Achieve Goals: Good Progress towards PT goals: Progressing toward goals    Frequency    7X/week      PT Plan Current plan remains appropriate    Co-evaluation              AM-PAC PT "6 Clicks" Mobility   Outcome Measure  Help needed turning from your back to your side while in a flat bed without using bedrails?: A Little Help  needed moving from lying on your back to sitting on the side of a flat bed without using bedrails?: A Little Help needed moving to and from a bed to a chair (including a wheelchair)?: A Little Help needed standing up from a chair using your arms (e.g., wheelchair or bedside chair)?: A Little Help needed to walk in hospital room?: A Little Help needed climbing 3-5 steps with a railing? : A Little 6 Click Score: 18    End of Session Equipment Utilized During Treatment: Gait belt Activity Tolerance: Patient tolerated treatment well Patient left: in bed;with call bell/phone within reach;with bed alarm set;with family/visitor present Nurse Communication: Mobility status PT Visit Diagnosis: Muscle weakness  (generalized) (M62.81);Difficulty in walking, not elsewhere classified (R26.2)     Time: [id number] PT Time Calculation (min) (ACUTE ONLY): 28 min  Charges:  $Gait Training: 23-37 mins                     Kenyon Ana, PT  Pager: [phone] Acute Rehab Dept [facility]): [id number]   04/15/2019    [facility] 04/15/2019, 4:05 PM

## 2019-04-17 ENCOUNTER — Encounter (HOSPITAL_COMMUNITY): Payer: Self-pay | Admitting: Orthopedic Surgery

## 2019-04-29 ENCOUNTER — Telehealth: Payer: Self-pay

## 2019-04-29 NOTE — Telephone Encounter (Signed)

## 2019-05-03 NOTE — Progress Notes (Signed)
Virtual Visit via Telephone Note   This visit type was conducted due to national recommendations for restrictions regarding the COVID-19 Pandemic (e.g. social distancing) in an effort to limit this patient's exposure and mitigate transmission in our community.  Due to his co-morbid illnesses, this patient is at least at moderate risk for complications without adequate follow up.  This format is felt to be most appropriate for this patient at this time.  All issues noted in this document were discussed and addressed.  A limited physical exam was performed with this format.  Please refer to the patient's chart for his consent to telehealth for [facility].   Evaluation Performed:  Follow-up visit  This visit type was conducted due to national recommendations for restrictions regarding the COVID-19 Pandemic (e.g. social distancing).  This format is felt to be most appropriate for this patient at this time.  All issues noted in this document were discussed and addressed.  No physical exam was performed (except for noted visual exam findings with Video Visits).  Please refer to the patient's chart (MyChart message for video visits and phone note for telephone visits) for the patient's consent to telehealth for [facility].  Date:  05/04/2019   ID:  Theodore Patton, DOB February 02, 1946, MRN [id number]  Patient Location:  Home  Provider location:   [place]  PCP:  Prince Solian, MD  Cardiologist:  Fransico Him, MD  Electrophysiologist:  None   Chief Complaint:  BAV,  Mild AS, dilated aorta  History of Present Illness:    Theodore Patton is a 73 y.o. male who presents via audio/video conferencing for a telehealth visit today.    Theodore Patton is a 73 y.o. 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  He also has a history of GERD and arthritis.   He is here today for followup  and is doing well.  He denies any chest pain or pressure, SOB, DOE, PND, orthopnea, LE edema, dizziness, palpitations or syncope. He is compliant with his meds and is tolerating meds with no SE.     The patient does not have symptoms concerning for COVID-19 infection (fever, chills, cough, or new shortness of breath).    Prior CV studies:   The following studies were reviewed today:  2D echo  Past Medical History:  Diagnosis Date  . Aortic regurgitation 01/07/2019   Moderate noted on ECHO  . 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 Arthritis   . Ascending aorta dilatation (HCC) 04/11/2018  . GERD (gastroesophageal reflux disease)   . Gout   . Heart murmur   . Hyperlipemia   . Primary localized osteoarthritis of right knee 04/14/2019  . Primary localized osteoarthrosis of right shoulder 07/24/2016   Past Surgical History:  Procedure Laterality Date  . COLONOSCOPY    . PARTIAL KNEE ARTHROPLASTY Right 04/14/2019   Procedure: UNICOMPARTMENTAL KNEE;  Surgeon: Marchia Bond, MD;  Location: [facility];  Service: Orthopedics;  Laterality: Right;  . PILONIDAL CYST EXCISION    . SHOULDER ARTHROSCOPY Right 2008  . TOTAL SHOULDER ARTHROPLASTY Right 07/24/2016   Procedure: TOTAL SHOULDER ARTHROPLASTY;  Surgeon: Marchia Bond, MD;  Location: [place];  Service: Orthopedics;  Laterality: Right;  . UPPER GI ENDOSCOPY       No outpatient medications have  been marked as taking for the 05/04/19 encounter (Telemedicine) with Sueanne Margarita, MD.     Allergies:   Sulfa antibiotics   Social History   Tobacco Use  . Smoking status: Former Smoker    Types: Cigarettes    Quit date: 07/12/1980    Years since quitting: 38.8  . Smokeless tobacco: Never Used  Substance Use Topics  . Alcohol use: Yes    Alcohol/week: 1.0 standard drinks    Types: 1 Cans of beer per week    Comment: occ  . Drug use: No     Family Hx:  The patient's family history includes Healthy in his sister; Heart Problems in his sister; Liver cancer in his brother.  ROS:   Please see the history of present illness.     All other systems reviewed and are negative.   Labs/Other Tests and Data Reviewed:    Recent Labs: 04/15/2019: BUN 15; Creatinine, Ser 0.97; Hemoglobin 11.0; Platelets 115; Potassium 3.9; Sodium 134   Recent Lipid Panel No results found for: CHOL, TRIG, HDL, CHOLHDL, LDLCALC, LDLDIRECT  Wt Readings from Last 3 Encounters:  04/14/19 197 lb 15.6 oz (89.8 kg)  04/06/19 198 lb (89.8 kg)  12/25/18 199 lb 1.9 oz (90.3 kg)     Objective:    Vital Signs:  BP 106/66   Pulse 60     ASSESSMENT & PLAN:    1.  Bicuspid AV with mild AS -2D echo 01/2019 with mild AS and mean AVG 75mmHg.   -continue to follow yearly with echo to assess for progression  2.   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  Moderate AI -repeat echo 01/2019 showed normal LV size and function with moderate AI and PHT of 372ms.  He has no LV dilatation and EF is normal -repeat 2D echo 01/2020  COVID-19 Education: The signs and symptoms of COVID-19 were discussed with the patient and how to seek care for testing (follow up with PCP or arrange E-visit).  The importance of social distancing was discussed today.  Patient Risk:   After full review of this patient's clinical status, I feel that they are at least moderate risk at this time.  Time:   Today, I have spent 20 minutes directly with the patient on telemedicine discussing medical problems including AS and AI.  We also reviewed the symptoms of COVID 19 and the ways to protect against contracting the virus with telehealth technology.  I spent an additional 5 minutes reviewing patient's chart including 2D echo, chest CTA.  Medication Adjustments/Labs and Tests Ordered: Current medicines are reviewed at length  with the patient today.  Concerns regarding medicines are outlined above.  Tests Ordered: No orders of the defined types were placed in this encounter.  Medication Changes: No orders of the defined types were placed in this encounter.   Disposition:  Follow up in 1 year(s)  Signed, Fransico Him, MD  05/04/2019 9:10 AM     [facility]

## 2019-05-04 ENCOUNTER — Other Ambulatory Visit: Payer: Self-pay

## 2019-05-04 ENCOUNTER — Encounter: Payer: Self-pay | Admitting: Cardiology

## 2019-05-04 ENCOUNTER — Telehealth (INDEPENDENT_AMBULATORY_CARE_PROVIDER_SITE_OTHER): Payer: Medicare Other | Admitting: Cardiology

## 2019-05-04 VITALS — BP 106/66 | HR 60

## 2019-05-04 DIAGNOSIS — I351 Nonrheumatic aortic (valve) insufficiency: Secondary | ICD-10-CM

## 2019-05-04 DIAGNOSIS — I77810 Thoracic aortic ectasia: Secondary | ICD-10-CM

## 2019-05-04 DIAGNOSIS — I350 Nonrheumatic aortic (valve) stenosis: Secondary | ICD-10-CM | POA: Diagnosis not present

## 2019-05-04 NOTE — Patient Instructions (Signed)
Medication Instructions:  Your physician recommends that you continue on your current medications as directed. Please refer to the Current Medication list given to you today.  *If you need a refill on your cardiac medications before your next appointment, please call your pharmacy*  Lab Work: [place]   If you have labs (blood work) drawn today and your tests are completely normal, you will receive your results only by: Marland Kitchen MyChart Message (if you have MyChart) OR . A paper copy in the mail If you have any lab test that is abnormal or we need to change your treatment, we will call you to review the results.  Testing/Procedures:Your physician has requested that you have an echocardiogram. Echocardiography is a painless test that uses sound waves to create images of your heart. It provides your doctor with information about the size and shape of your heart and how well your heart's chambers and valves are working. This procedure takes approximately one hour. There are no restrictions for this procedure.   Follow-Up: At [facility], you and your health needs are our priority.  As part of our continuing mission to provide you with exceptional heart care, we have created designated Provider Care Teams.  These Care Teams include your primary Cardiologist (physician) and Advanced Practice Providers (APPs -  Physician Assistants and Nurse Practitioners) who all work together to provide you with the care you need, when you need it.  Your next appointment:   1 year(s)  The format for your next appointment:   In Person  Provider:   You may see Fransico Him, MD or one of the following Advanced Practice Providers on your designated Care Team:    Melina Copa, PA-C  Ermalinda Barrios, PA-C   Other Instructions

## 2019-05-04 NOTE — Addendum Note (Signed)
Addended by: Claude Manges on: 05/04/2019 09:20 AM   Modules accepted: Orders

## 2019-07-03 ENCOUNTER — Ambulatory Visit: Payer: Medicare Other

## 2019-07-16 ENCOUNTER — Ambulatory Visit: Payer: Medicare Other

## 2019-11-06 ENCOUNTER — Other Ambulatory Visit: Payer: Self-pay | Admitting: Orthopaedic Surgery

## 2019-11-06 DIAGNOSIS — M19072 Primary osteoarthritis, left ankle and foot: Secondary | ICD-10-CM

## 2019-11-18 ENCOUNTER — Other Ambulatory Visit: Payer: Self-pay

## 2019-11-18 ENCOUNTER — Ambulatory Visit
Admission: RE | Admit: 2019-11-18 | Discharge: 2019-11-18 | Disposition: A | Discharge status: Home / Self Care | Payer: Medicare Other | Source: Ambulatory Visit | Attending: Orthopaedic Surgery | Admitting: Orthopaedic Surgery

## 2019-11-18 DIAGNOSIS — M19072 Primary osteoarthritis, left ankle and foot: Secondary | ICD-10-CM

## 2020-01-22 ENCOUNTER — Other Ambulatory Visit (HOSPITAL_COMMUNITY): Payer: Medicare Other

## 2020-02-01 ENCOUNTER — Ambulatory Visit (HOSPITAL_COMMUNITY): Payer: Medicare Other | Attending: Cardiology

## 2020-02-01 ENCOUNTER — Other Ambulatory Visit: Payer: Self-pay

## 2020-02-01 ENCOUNTER — Encounter: Payer: Self-pay | Admitting: Cardiology

## 2020-02-01 DIAGNOSIS — I351 Nonrheumatic aortic (valve) insufficiency: Secondary | ICD-10-CM

## 2020-02-01 DIAGNOSIS — I77810 Thoracic aortic ectasia: Secondary | ICD-10-CM | POA: Diagnosis present

## 2020-02-01 DIAGNOSIS — I350 Nonrheumatic aortic (valve) stenosis: Secondary | ICD-10-CM | POA: Insufficient documentation

## 2020-02-01 LAB — ECHOCARDIOGRAM COMPLETE
AR max vel: 2.99 cm2
AV Area VTI: 2.98 cm2
AV Area mean vel: 2.79 cm2
AV Mean grad: 8 mmHg
AV Peak grad: 16.6 mmHg
Ao pk vel: 2.04 m/s
Area-P 1/2: 2.05 cm2
P 1/2 time: 481 msec
S' Lateral: 3.7 cm

## 2020-02-05 ENCOUNTER — Telehealth: Payer: Self-pay

## 2020-02-05 DIAGNOSIS — I351 Nonrheumatic aortic (valve) insufficiency: Secondary | ICD-10-CM

## 2020-02-05 NOTE — Telephone Encounter (Signed)
The patient has been notified of the result and verbalized understanding.  All questions (if any) were answered. Antonieta Iba, RN 02/05/2020 4:29 PM

## 2020-02-05 NOTE — Telephone Encounter (Signed)
-----   Message from Sueanne Margarita, MD sent at 02/01/2020 10:01 AM EDT ----- Echo showed normal heart function with increased stiffness of heart muscle , mildly enlarged aorta at 62mm, moderate AI - repeat echo in 6 months for AI

## 2020-05-10 ENCOUNTER — Encounter: Payer: Self-pay | Admitting: Cardiology

## 2020-05-10 ENCOUNTER — Other Ambulatory Visit: Payer: Self-pay

## 2020-05-10 ENCOUNTER — Ambulatory Visit: Payer: Medicare Other | Admitting: Cardiology

## 2020-05-10 VITALS — BP 140/70 | HR 73 | Ht 73.0 in | Wt 196.0 lb

## 2020-05-10 DIAGNOSIS — Q231 Congenital insufficiency of aortic valve: Secondary | ICD-10-CM

## 2020-05-10 DIAGNOSIS — I77810 Thoracic aortic ectasia: Secondary | ICD-10-CM | POA: Diagnosis not present

## 2020-05-10 DIAGNOSIS — Q230 Congenital stenosis of aortic valve: Secondary | ICD-10-CM

## 2020-05-10 DIAGNOSIS — I351 Nonrheumatic aortic (valve) insufficiency: Secondary | ICD-10-CM | POA: Diagnosis not present

## 2020-05-10 NOTE — Progress Notes (Signed)
Date:  05/10/2020   ID:  CAMP GOPAL, DOB 10-06-1945, MRN [id number]   PCP:  Prince Solian, MD  Cardiologist:  Fransico Him, MD  Electrophysiologist:  None   Chief Complaint:  BAV,  Moderate AI, dilated aorta  History of Present Illness:    Theodore Patton is a 74 y.o. 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  He also has a history of GERD and arthritis.   He is here today for followup and is doing well.  He denies any chest pain or pressure, SOB, DOE, PND, orthopnea, LE edema, dizziness, palpitations or syncope. He is compliant with his meds and is tolerating meds with no SE.    Prior CV studies:   The following studies were reviewed today:  2D echo, EKG  Past Medical History:  Diagnosis Date  . Aortic regurgitation 01/07/2019   Moderate noted on ECHO 01/2020  . 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 Arthritis   . Ascending aorta dilatation (HCC) 04/11/2018  . GERD (gastroesophageal reflux disease)   . Gout   . Heart murmur   . Hyperlipemia   . Primary localized osteoarthritis of right knee 04/14/2019  . Primary localized osteoarthrosis of right shoulder 07/24/2016   Past Surgical History:  Procedure Laterality Date  . COLONOSCOPY    . PARTIAL KNEE ARTHROPLASTY Right 04/14/2019   Procedure: UNICOMPARTMENTAL KNEE;  Surgeon: Marchia Bond, MD;  Location: [facility];  Service: Orthopedics;  Laterality: Right;  . PILONIDAL CYST EXCISION    . SHOULDER ARTHROSCOPY Right 2008  . TOTAL SHOULDER ARTHROPLASTY Right 07/24/2016   Procedure: TOTAL SHOULDER ARTHROPLASTY;  Surgeon: Marchia Bond, MD;  Location: [place];  Service: Orthopedics;  Laterality: Right;  . UPPER GI ENDOSCOPY       Current Meds  Medication Sig  . acetaminophen (TYLENOL) 500 MG tablet Take 1,000 mg by mouth 3  (three) times daily.   Marland Kitchen allopurinol (ZYLOPRIM) 300 MG tablet Take 300 mg by mouth daily.  . baclofen (LIORESAL) 10 MG tablet Take 1 tablet (10 mg total) by mouth 3 (three) times daily. As needed for muscle spasm  . buPROPion HCl ER, XL, 450 MG TB24 Take 450 mg by mouth daily.   . colchicine 0.6 MG tablet Take 0.6 mg by mouth every 6 (six) hours as needed (gout flares).   Marland Kitchen dexlansoprazole (DEXILANT) 60 MG capsule Take 60 mg by mouth daily as needed (acid reflux/indigestion.).  Marland Kitchen diphenhydramine-acetaminophen (TYLENOL PM) 25-500 MG TABS tablet Take 2 tablets by mouth at bedtime.  . fluticasone (FLONASE) 50 MCG/ACT nasal spray Place 1-2 sprays into both nostrils at bedtime as needed. For allergies/nasal congestion  . gabapentin (NEURONTIN) 300 MG capsule Take 600 mg by mouth 3 (three) times daily.  . Glucosamine-Chondroitin (COSAMIN DS PO) Take 1 tablet by mouth 2 (two) times daily.   Marland Kitchen loratadine (CLARITIN) 10 MG tablet Take 10 mg by mouth at bedtime.  . Multiple Vitamin (MULTIVITAMIN WITH MINERALS) TABS tablet Take 1 tablet by mouth daily. Complete Multivitamin  . naproxen sodium (ALEVE) 220 MG tablet Take 440 mg by mouth 2 (two) times daily.  . Omega-3 Fatty Acids (FISH OIL ULTRA) 1400 MG CAPS Take 1,400 mg by mouth daily.  Marland Kitchen oxyCODONE (ROXICODONE) 5 MG immediate release tablet Take 1 tablet (5 mg  total) by mouth every 4 (four) hours as needed for severe pain.  . ranitidine (ZANTAC) 150 MG tablet Take 150 mg by mouth 2 (two) times daily as needed for heartburn (acid reflux/indigestion.).  Marland Kitchen saline (AYR) GEL Place 1 application into the nose every 6 (six) hours as needed (nasal congestion.).  Marland Kitchen SALINE NASAL MIST NA Place 1 spray into the nose 3 (three) times daily as needed (congestion).  . sennosides-docusate sodium (SENOKOT-S) 8.6-50 MG tablet Take 2 tablets by mouth daily.  . tamsulosin (FLOMAX) 0.4 MG CAPS capsule Take 0.4 mg by mouth at bedtime.  . TURMERIC PO Take 1,000 mg by mouth 2 (two)  times daily.      Allergies:   Sulfa antibiotics   Social History   Tobacco Use  . Smoking status: Former Smoker    Types: Cigarettes    Quit date: 07/12/1980    Years since quitting: 39.8  . Smokeless tobacco: Never Used  Vaping Use  . Vaping Use: Never used  Substance Use Topics  . Alcohol use: Yes    Alcohol/week: 1.0 standard drink    Types: 1 Cans of beer per week    Comment: occ  . Drug use: No     Family Hx: The patient's family history includes Healthy in his sister; Heart Problems in his sister; Liver cancer in his brother.  ROS:   Please see the history of present illness.     All other systems reviewed and are negative.   Labs/Other Tests and Data Reviewed:    Recent Labs: No results found for requested labs within last 8760 hours.   Recent Lipid Panel No results found for: CHOL, TRIG, HDL, CHOLHDL, LDLCALC, LDLDIRECT  Wt Readings from Last 3 Encounters:  05/10/20 196 lb (88.9 kg)  04/14/19 197 lb 15.6 oz (89.8 kg)  04/06/19 198 lb (89.8 kg)     Objective:    Vital Signs:  BP 140/70   Pulse 73   Ht 6\' 1"  (1.854 m)   Wt 196 lb (88.9 kg)   SpO2 97%   BMI 25.86 kg/m    GEN: Well nourished, well developed in no acute distress HEENT: Normal NECK: No JVD; No carotid bruits LYMPHATICS: No lymphadenopathy CARDIAC:RRR, no  rubs, gallops.  1/6 diastolic murmur in RUSB RESPIRATORY:  Clear to auscultation without rales, wheezing or rhonchi  ABDOMEN: Soft, non-tender, non-distended MUSCULOSKELETAL:  No edema; No deformity  SKIN: Warm and dry NEUROLOGIC:  Alert and oriented x 3 PSYCHIATRIC:  Normal affect   EKG done in office today showed NSR at 73bpm with no ST changes  ASSESSMENT & PLAN:    1.  Bicuspid AV with mild AS -2D echo 01/2020 with no AS and mean AVG 1mmHg.    2.   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  Moderate AI -repeat echo 01/2020 showed normal LV size  and function with moderate AI and PHT of 441ms.  He has no LV dilatation and EF is normal -repeat 2D echo 8/22 to make sure this remains stable   Medication Adjustments/Labs and Tests Ordered: Current medicines are reviewed at length with the patient today.  Concerns regarding medicines are outlined above.  Tests Ordered: Orders Placed This Encounter  Procedures  . EKG 12-Lead   Medication Changes: No orders of the defined types were placed in this encounter.   Disposition:  Follow up in 1 year(s)  Signed, Fransico Him, MD  05/10/2020 9:50 AM    [facility]

## 2020-05-10 NOTE — Patient Instructions (Signed)
Medication Instructions:  Your physician recommends that you continue on your current medications as directed. Please refer to the Current Medication list given to you today.  *If you need a refill on your cardiac medications before your next appointment, please call your pharmacy*   Testing/Procedures: Your physician has requested that you have an echocardiogram in August 2022. Echocardiography is a painless test that uses sound waves to create images of your heart. It provides your doctor with information about the size and shape of your heart and how well your heart's chambers and valves are working. This procedure takes approximately one hour. There are no restrictions for this procedure.  Follow-Up: At [facility], you and your health needs are our priority.  As part of our continuing mission to provide you with exceptional heart care, we have created designated Provider Care Teams.  These Care Teams include your primary Cardiologist (physician) and Advanced Practice Providers (APPs -  Physician Assistants and Nurse Practitioners) who all work together to provide you with the care you need, when you need it.  Your next appointment:   1 year(s)  The format for your next appointment:   In Person  Provider:   You may see Fransico Him, MD or one of the following Advanced Practice Providers on your designated Care Team:    Melina Copa, PA-C  Ermalinda Barrios, PA-C

## 2020-05-10 NOTE — Addendum Note (Signed)
Addended by: Antonieta Iba on: 05/10/2020 09:57 AM   Modules accepted: Orders

## 2020-08-08 ENCOUNTER — Other Ambulatory Visit (HOSPITAL_COMMUNITY): Payer: Medicare Other

## 2021-01-10 ENCOUNTER — Other Ambulatory Visit (HOSPITAL_COMMUNITY): Payer: Medicare Other

## 2021-01-20 ENCOUNTER — Ambulatory Visit (HOSPITAL_COMMUNITY): Payer: Medicare Other | Attending: Internal Medicine

## 2021-01-20 ENCOUNTER — Encounter (INDEPENDENT_AMBULATORY_CARE_PROVIDER_SITE_OTHER): Payer: Self-pay

## 2021-01-20 ENCOUNTER — Other Ambulatory Visit: Payer: Self-pay

## 2021-01-20 DIAGNOSIS — I351 Nonrheumatic aortic (valve) insufficiency: Secondary | ICD-10-CM | POA: Insufficient documentation

## 2021-01-20 LAB — ECHOCARDIOGRAM COMPLETE
Area-P 1/2: 2.87 cm2
P 1/2 time: 554 msec
S' Lateral: 3.9 cm

## 2021-01-22 ENCOUNTER — Encounter: Payer: Self-pay | Admitting: Cardiology

## 2021-01-23 ENCOUNTER — Telehealth: Payer: Self-pay

## 2021-01-23 DIAGNOSIS — I712 Thoracic aortic aneurysm, without rupture, unspecified: Secondary | ICD-10-CM

## 2021-01-23 NOTE — Telephone Encounter (Signed)
The patient has been notified of the result and verbalized understanding.  All questions (if any) were answered. Antonieta Iba, RN 01/23/2021 4:10 PM

## 2021-01-23 NOTE — Telephone Encounter (Signed)
-----   Message from Sueanne Margarita, MD sent at 01/22/2021  8:42 PM EDT ----- Echo showed normal heart function with mildly thickened heart muscle, moderate leakiness of AV due to calcification of the right coronary cusp, mildly dilated aortic root at 23m and ascending aorta at 34m  Compared to prior study 2021, aortic root diameter has increased from 3978mo 64m57mPlease get gated Chest CTA to assess aorta further

## 2021-02-13 ENCOUNTER — Ambulatory Visit (HOSPITAL_COMMUNITY): Payer: Medicare Other

## 2021-02-24 ENCOUNTER — Other Ambulatory Visit: Payer: Self-pay

## 2021-02-24 ENCOUNTER — Ambulatory Visit (HOSPITAL_COMMUNITY)
Admission: RE | Admit: 2021-02-24 | Discharge: 2021-02-24 | Disposition: A | Discharge status: Home / Self Care | Payer: Medicare Other | Source: Ambulatory Visit | Attending: Cardiology | Admitting: Cardiology

## 2021-02-24 DIAGNOSIS — I712 Thoracic aortic aneurysm, without rupture, unspecified: Secondary | ICD-10-CM

## 2021-02-24 LAB — POCT I-STAT CREATININE: Creatinine, Ser: 1.3 mg/dL — ABNORMAL HIGH (ref 0.61–1.24)

## 2021-02-24 MED ORDER — IOHEXOL 350 MG/ML SOLN
75.0000 mL | Freq: Once | INTRAVENOUS | Status: AC | PRN
  Administered 2021-02-24: 75 mL via INTRAVENOUS

## 2021-02-27 ENCOUNTER — Encounter: Payer: Self-pay | Admitting: Cardiology

## 2021-02-27 ENCOUNTER — Telehealth: Payer: Self-pay

## 2021-02-27 DIAGNOSIS — I2510 Atherosclerotic heart disease of native coronary artery without angina pectoris: Secondary | ICD-10-CM

## 2021-02-27 DIAGNOSIS — E7800 Pure hypercholesterolemia, unspecified: Secondary | ICD-10-CM

## 2021-02-27 DIAGNOSIS — I351 Nonrheumatic aortic (valve) insufficiency: Secondary | ICD-10-CM

## 2021-02-27 NOTE — Telephone Encounter (Signed)
Left detailed message per DPR.  Requested call back to schedule lab work in one week.  Orders entered.  Sent results to Pt's PCP for follow up on fatty liver.  Await call back.

## 2021-02-27 NOTE — Telephone Encounter (Signed)
-----   Message from Sueanne Margarita, MD sent at 02/27/2021 11:50 AM EDT ----- Stop NSAIDS and repeat BMET in 1 week

## 2021-02-27 NOTE — Telephone Encounter (Signed)
-----   Message from Sueanne Margarita, MD sent at 02/27/2021 11:52 AM EDT ----- No aortic aneurysm.  Fatty liver noted - forward to PCP to followup on fatty liver.  Needs to come in for FLP and ALT given coronary artery calcifications on Chest CT

## 2021-03-07 ENCOUNTER — Other Ambulatory Visit: Payer: Self-pay

## 2021-03-07 ENCOUNTER — Other Ambulatory Visit: Payer: Medicare Other | Admitting: *Deleted

## 2021-03-07 DIAGNOSIS — E7800 Pure hypercholesterolemia, unspecified: Secondary | ICD-10-CM

## 2021-03-07 DIAGNOSIS — I351 Nonrheumatic aortic (valve) insufficiency: Secondary | ICD-10-CM

## 2021-03-07 DIAGNOSIS — I2510 Atherosclerotic heart disease of native coronary artery without angina pectoris: Secondary | ICD-10-CM

## 2021-03-07 LAB — BASIC METABOLIC PANEL
BUN/Creatinine Ratio: 15 (ref 10–24)
BUN: 17 mg/dL (ref 8–27)
CO2: 23 mmol/L (ref 20–29)
Calcium: 9 mg/dL (ref 8.6–10.2)
Chloride: 101 mmol/L (ref 96–106)
Creatinine, Ser: 1.17 mg/dL (ref 0.76–1.27)
Glucose: 103 mg/dL — ABNORMAL HIGH (ref 70–99)
Potassium: 4.5 mmol/L (ref 3.5–5.2)
Sodium: 138 mmol/L (ref 134–144)
eGFR: 66 mL/min/{1.73_m2} (ref >59)

## 2021-03-07 LAB — LIPID PANEL
Chol/HDL Ratio: 5 ratio (ref 0.0–5.0)
Cholesterol, Total: 139 mg/dL (ref 100–199)
HDL: 28 mg/dL — ABNORMAL LOW (ref >39)
LDL Chol Calc (NIH): 69 mg/dL (ref 0–99)
Triglycerides: 261 mg/dL — ABNORMAL HIGH (ref 0–149)
VLDL Cholesterol Cal: 42 mg/dL — ABNORMAL HIGH (ref 5–40)

## 2021-03-07 LAB — ALT: ALT: 9 IU/L (ref 0–44)

## 2021-03-08 ENCOUNTER — Telehealth: Payer: Self-pay

## 2021-03-08 DIAGNOSIS — E7800 Pure hypercholesterolemia, unspecified: Secondary | ICD-10-CM

## 2021-03-08 MED ORDER — ICOSAPENT ETHYL 1 G PO CAPS: 2.0000 g | ORAL_CAPSULE | Freq: Two times a day (BID) | ORAL | 3 refills | Status: DC

## 2021-03-08 NOTE — Telephone Encounter (Signed)
The patient has been notified of the result and verbalized understanding.  All questions (if any) were answered. Antonieta Iba, RN 03/08/2021 10:17 AM

## 2021-03-08 NOTE — Telephone Encounter (Signed)
-----   Message from Sueanne Margarita, MD sent at 03/08/2021  8:41 AM EDT ----- Agree with repeat FLp and ALT in 3 months ----- Message ----- From: Rollen Sox, [facility] Sent: 03/08/2021   8:09 AM EDT To: Sueanne Margarita, MD, Antonieta Iba, RN  Recommend starting Vascepa 2g po BID with food

## 2021-05-12 ENCOUNTER — Ambulatory Visit: Payer: Medicare Other | Admitting: Cardiology

## 2021-06-19 ENCOUNTER — Other Ambulatory Visit: Payer: Self-pay

## 2021-06-19 ENCOUNTER — Telehealth (INDEPENDENT_AMBULATORY_CARE_PROVIDER_SITE_OTHER): Payer: Medicare Other | Admitting: Cardiology

## 2021-06-19 VITALS — Ht 73.0 in | Wt 192.0 lb

## 2021-06-19 DIAGNOSIS — I2510 Atherosclerotic heart disease of native coronary artery without angina pectoris: Secondary | ICD-10-CM | POA: Diagnosis not present

## 2021-06-19 DIAGNOSIS — I351 Nonrheumatic aortic (valve) insufficiency: Secondary | ICD-10-CM | POA: Diagnosis not present

## 2021-06-19 DIAGNOSIS — I359 Nonrheumatic aortic valve disorder, unspecified: Secondary | ICD-10-CM

## 2021-06-19 DIAGNOSIS — E7800 Pure hypercholesterolemia, unspecified: Secondary | ICD-10-CM

## 2021-06-19 DIAGNOSIS — I77810 Thoracic aortic ectasia: Secondary | ICD-10-CM | POA: Diagnosis not present

## 2021-06-19 NOTE — Addendum Note (Signed)
Addended by: Ma Hillock on: 06/19/2021 10:40 AM   Modules accepted: Orders

## 2021-06-19 NOTE — Patient Instructions (Signed)
Medication Instructions:  Your physician recommends that you continue on your current medications as directed. Please refer to the Current Medication list given to you today.  *If you need a refill on your cardiac medications before your next appointment, please call your pharmacy*   Lab Work: Lipids and ALT scheduled for 2/1  If you have labs (blood work) drawn today and your tests are completely normal, you will receive your results only by: [place] (if you have MyChart) OR A paper copy in the mail If you have any lab test that is abnormal or we need to change your treatment, we will call you to review the results.   Testing/Procedures: Echo due 8/23 Chest CT due 9/23    Follow-Up: At [facility], you and your health needs are our priority.  As part of our continuing mission to provide you with exceptional heart care, we have created designated Provider Care Teams.  These Care Teams include your primary Cardiologist (physician) and Advanced Practice Providers (APPs -  Physician Assistants and Nurse Practitioners) who all work together to provide you with the care you need, when you need it.   Your next appointment:   1 year(s)  The format for your next appointment:   In Person  Provider:   Fransico Him, MD     Other Instructions Your physician has requested that you have an echocardiogram. Echocardiography is a painless test that uses sound waves to create images of your heart. It provides your doctor with information about the size and shape of your heart and how well your hearts chambers and valves are working. This procedure takes approximately one hour. There are no restrictions for this procedure.

## 2021-06-19 NOTE — Progress Notes (Signed)
Virtual Visit via Video Note   This visit type was conducted due to national recommendations for restrictions regarding the COVID-19 Pandemic (e.g. social distancing) in an effort to limit this patient's exposure and mitigate transmission in our community.  Due to his co-morbid illnesses, this patient is at least at moderate risk for complications without adequate follow up.  This format is felt to be most appropriate for this patient at this time.  All issues noted in this document were discussed and addressed.  A limited physical exam was performed with this format.  Please refer to the patient's chart for his consent to telehealth for [facility].     Date:  06/19/2021   ID:  Theodore Patton, DOB 05/06/1946, MRN [id number] The patient was identified using 2 identifiers.  Patient Location: Home Provider Location: Home Office   PCP:  Prince Solian, MD   [facility] Providers Cardiologist:  Fransico Him, MD     Evaluation Performed:  Follow-Up Visit  Chief Complaint: AI, aortic root aneurysm  History of Present Illness:    Theodore Patton is a 76 y.o. 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  He also has a history of GERD and arthritis.   He is here today for followup and is doing well.  He denies any chest pain or pressure, SOB, DOE, PND, orthopnea, LE edema, dizziness, palpitations or syncope.  He is compliant with his meds and is tolerating meds with no SE.     The patient does not have symptoms concerning for COVID-19 infection (fever, chills, cough, or new shortness of breath).    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 by echo 2019, completely asymptomatic   Arthritis    Coronary artery calcification seen on CAT scan    GERD (gastroesophageal reflux disease)    Gout    Heart murmur    Hyperlipemia    Primary localized osteoarthritis of right knee 04/14/2019   Primary localized osteoarthrosis of right shoulder 07/24/2016   Past Surgical History:  Procedure Laterality Date   COLONOSCOPY     PARTIAL KNEE ARTHROPLASTY Right 04/14/2019   Procedure: UNICOMPARTMENTAL KNEE;  Surgeon: Marchia Bond, MD;  Location: [facility];  Service: Orthopedics;  Laterality: Right;   PILONIDAL CYST EXCISION     SHOULDER ARTHROSCOPY Right 2008   TOTAL SHOULDER ARTHROPLASTY Right 07/24/2016   Procedure: TOTAL SHOULDER ARTHROPLASTY;  Surgeon: Marchia Bond, MD;  Location: [place];  Service: Orthopedics;  Laterality: Right;   UPPER GI ENDOSCOPY       Current Meds  Medication Sig   acetaminophen (TYLENOL) 500 MG tablet Take 1,000 mg by mouth 3 (three) times daily.    allopurinol (ZYLOPRIM) 300 MG tablet Take 300 mg by mouth daily.   baclofen (LIORESAL) 10 MG tablet Take 1 tablet (10 mg total) by mouth 3 (three) times daily. As needed for muscle spasm   buPROPion HCl ER, XL, 450 MG TB24 Take 450 mg by mouth daily.    colchicine 0.6 MG tablet Take 0.6 mg by mouth every 6 (  six) hours as needed (gout flares).    diphenhydramine-acetaminophen (TYLENOL PM) 25-500 MG TABS tablet Take 2 tablets by mouth at bedtime.   fluticasone (FLONASE) 50 MCG/ACT nasal spray Place 1-2 sprays into both nostrils at bedtime as needed. For allergies/nasal congestion   gabapentin (NEURONTIN) 300 MG capsule Take 600 mg by mouth 3 (three) times daily.   Glucosamine-Chondroitin (COSAMIN DS PO) Take 1 tablet by mouth 2 (two) times daily.    Multiple Vitamin (MULTIVITAMIN WITH MINERALS) TABS tablet Take 1 tablet by mouth daily. Complete Multivitamin   naproxen sodium (ALEVE) 220 MG tablet Take 440 mg by  mouth 2 (two) times daily.   Omega-3 Fatty Acids (FISH OIL ULTRA) 1400 MG CAPS Take 1,400 mg by mouth daily.   ranitidine (ZANTAC) 150 MG tablet Take 150 mg by mouth 2 (two) times daily as needed for heartburn (acid reflux/indigestion.).   saline (AYR) GEL Place 1 application into the nose every 6 (six) hours as needed (nasal congestion.).   SALINE NASAL MIST NA Place 1 spray into the nose 3 (three) times daily as needed (congestion).   tamsulosin (FLOMAX) 0.4 MG CAPS capsule Take 0.4 mg by mouth at bedtime.   TURMERIC PO Take 1,000 mg by mouth 2 (two) times daily.      Allergies:   Sulfa antibiotics   Social History   Tobacco Use   Smoking status: Former    Types: Cigarettes    Quit date: 07/12/1980    Years since quitting: 40.9   Smokeless tobacco: Never  Vaping Use   Vaping Use: Never used  Substance Use Topics   Alcohol use: Yes    Alcohol/week: 1.0 standard drink    Types: 1 Cans of beer per week    Comment: occ   Drug use: No     Family Hx: The patient's family history includes Healthy in his sister; Heart Problems in his sister; Liver cancer in his brother.  ROS:   Please see the history of present illness.     All other systems reviewed and are negative.   Prior CV studies:   The following studies were reviewed today:  none  Labs/Other Tests and Data Reviewed:    EKG:  No ECG reviewed.  Recent Labs: 03/07/2021: ALT 9; BUN 17; Creatinine, Ser 1.17; Potassium 4.5; Sodium 138   Recent Lipid Panel Lab Results  Component Value Date/Time   CHOL 139 03/07/2021 09:48 AM   TRIG 261 (H) 03/07/2021 09:48 AM   HDL 28 (L) 03/07/2021 09:48 AM   CHOLHDL 5.0 03/07/2021 09:48 AM   LDLCALC 69 03/07/2021 09:48 AM    Wt Readings from Last 3 Encounters:  06/19/21 192 lb (87.1 kg)  05/10/20 196 lb (88.9 kg)  04/14/19 197 lb 15.6 oz (89.8 kg)     Risk Assessment/Calculations:          Objective:    Vital Signs:  Ht 6\' 1"  (1.854 m)    Wt 192 lb (87.1 kg)    BMI  25.33 kg/m    VITAL SIGNS:  reviewed GEN:  no acute distress EYES:  sclerae anicteric, EOMI - Extraocular Movements Intact RESPIRATORY:  normal respiratory effort, symmetric expansion CARDIOVASCULAR:  no peripheral edema SKIN:  no rash, lesions or ulcers. MUSCULOSKELETAL:  no obvious deformities. NEURO:  alert and oriented x 3, no obvious focal deficit PSYCH:  normal affect  ASSESSMENT & PLAN:    1.  Aortic valve disease -This was previously felt to be a bicuspid aortic valve and  in the past mild AS  -2D echo 01/20/2021 showed normal LV function with EF 60 to 65% with mild LVH, grade 1 diastolic dysfunction with trileaflet aortic valve and calcification of the right coronary cusp leading to mild coaptation and an eccentric jet of AI with moderate AI.  There was no evidence of aortic valve stenosis. -repeat echo 01/2022   2.   Aortic root and ascending aortic dilatation -this measured 43 mm at the aortic root and 38 mm at the ascending aorta -Chest CTA 02/2021 showed 46mm -repeat Chest CTA 02/2022   3.  Moderate AI -repeat echo 01/2021 showed calcification of the right coronary cusp leading to mild coaptation and an eccentric jet of AI with moderate AI.  -Repeat 2D echocardiogram 01/2022  4.  Coronary calcifications -CT 2019 showed possible minimal calcified in the left circumflex -He denies any anginal symptoms  5.  Hyperlipidemia -LDL goal less than 70 -I have personally reviewed and interpreted outside labs performed by patient's PCP which showed LDL 69, triglycerides 261, HDL 28 -He is currently not on statin therapy -started on Vascepa 2gm BID for high TAGs -Check FLP and ALT again given how high triglycerides were and make sure fasting>>if still elevated then send to lipid clinic   COVID-19 Education: The signs and symptoms of COVID-19 were discussed with the patient and how to seek care for testing (follow up with PCP or arrange E-visit).  The importance of social distancing  was discussed today.  Time:   Today, I have spent 20 minutes with the patient with telehealth technology discussing the above problems.     Medication Adjustments/Labs and Tests Ordered: Current medicines are reviewed at length with the patient today.  Concerns regarding medicines are outlined above.   Tests Ordered: No orders of the defined types were placed in this encounter.   Medication Changes: No orders of the defined types were placed in this encounter.   Follow Up:  In Person in 1 year(s)  Signed, Fransico Him, MD  06/19/2021 10:15 AM    [place]

## 2021-07-05 ENCOUNTER — Other Ambulatory Visit: Payer: Medicare Other | Admitting: *Deleted

## 2021-07-05 ENCOUNTER — Other Ambulatory Visit: Payer: Self-pay

## 2021-07-05 LAB — LIPID PANEL
Chol/HDL Ratio: 3.5 ratio (ref 0.0–5.0)
Cholesterol, Total: 111 mg/dL (ref 100–199)
HDL: 32 mg/dL — ABNORMAL LOW (ref >39)
LDL Chol Calc (NIH): 49 mg/dL (ref 0–99)
Triglycerides: 182 mg/dL — ABNORMAL HIGH (ref 0–149)
VLDL Cholesterol Cal: 30 mg/dL (ref 5–40)

## 2021-07-05 LAB — ALT: ALT: 14 IU/L (ref 0–44)

## 2021-07-12 ENCOUNTER — Telehealth: Payer: Self-pay

## 2021-07-12 MED ORDER — ICOSAPENT ETHYL 1 G PO CAPS: 2.0000 g | ORAL_CAPSULE | Freq: Two times a day (BID) | ORAL | 3 refills | Status: DC

## 2021-07-12 NOTE — Telephone Encounter (Signed)
-----   Message from Leeroy Bock, [place] sent at 07/06/2021 12:56 PM EST ----- Pt supposed to be taking Vascepa but has marked on his med list as not taking - would clarify with pt, he should be taking Vascepa 2g BID to help with TG lowering. Focusing on limiting carbs, sugar, and alcohol in his diet would also help bring TG to goal given mild elevation.

## 2021-07-12 NOTE — Telephone Encounter (Signed)
Spoke with the patient who states that he actually never started the [place]. I advised him to start taking it. Repeat lab work has been scheduled. Patient verbalized understanding.

## 2021-07-12 NOTE — Telephone Encounter (Signed)
He should resume his Vascepa 2g BID as long as he was tolerating it. Provides triglyceride lowering and CV benefit. This does not replace statins, he should remain on his rosuvastatin as well.

## 2021-07-12 NOTE — Telephone Encounter (Signed)
Called and discussed lab results with patient. Patient states his other physician, Dr. Elsworth Soho, at [facility] prescribed him Rosuvastatin 5mg  QD and advised him to take that medication instead of Vascepa 2mg  BID.   Medication list updated to reflect patient not taking Vascepa and currently taking the Rosuvastatin 5mg  QD.  Also discussed dietary changes to help lower triglycerides such as limiting carb, sugar and alcohol intake. Patient states he and his wife have been discussing cutting back on their carb intake. Patient verbalized understanding of labs results and diet recommendations.

## 2021-07-12 NOTE — Addendum Note (Signed)
Addended by: Antonieta Iba on: 07/12/2021 04:26 PM   Modules accepted: Orders

## 2021-09-04 ENCOUNTER — Other Ambulatory Visit: Payer: Medicare Other | Admitting: *Deleted

## 2021-09-04 ENCOUNTER — Other Ambulatory Visit: Payer: Self-pay

## 2021-09-04 DIAGNOSIS — I2510 Atherosclerotic heart disease of native coronary artery without angina pectoris: Secondary | ICD-10-CM

## 2021-09-04 DIAGNOSIS — E7800 Pure hypercholesterolemia, unspecified: Secondary | ICD-10-CM

## 2021-09-04 LAB — LIPID PANEL
Chol/HDL Ratio: 4.7 ratio (ref 0.0–5.0)
Cholesterol, Total: 155 mg/dL (ref 100–199)
HDL: 33 mg/dL — ABNORMAL LOW (ref >39)
LDL Chol Calc (NIH): 93 mg/dL (ref 0–99)
Triglycerides: 164 mg/dL — ABNORMAL HIGH (ref 0–149)
VLDL Cholesterol Cal: 29 mg/dL (ref 5–40)

## 2021-09-05 ENCOUNTER — Telehealth: Payer: Self-pay

## 2021-09-05 DIAGNOSIS — E7800 Pure hypercholesterolemia, unspecified: Secondary | ICD-10-CM

## 2021-09-05 MED ORDER — ROSUVASTATIN CALCIUM 10 MG PO TABS: 10.0000 mg | ORAL_TABLET | Freq: Every day | ORAL | 3 refills | Status: DC

## 2021-09-05 NOTE — Telephone Encounter (Signed)
-----   Message from Sueanne Margarita, MD sent at 09/05/2021  6:58 AM EDT ----- ?LDL not at goal - increase Crestor to '10mg'$  daily and repeat FLP and ALT in 6 weeks ?

## 2021-09-05 NOTE — Telephone Encounter (Signed)
The patient has been notified of the result and verbalized understanding.  All questions (if any) were answered. ?Antonieta Iba, RN 09/05/2021 10:03 AM  ?Rx has been sent in for Crestor 10 mg daily. Repeat labs have been scheduled.  ?

## 2021-11-02 ENCOUNTER — Other Ambulatory Visit: Payer: Medicare Other

## 2021-11-06 ENCOUNTER — Other Ambulatory Visit: Payer: Medicare Other

## 2021-11-06 DIAGNOSIS — E7800 Pure hypercholesterolemia, unspecified: Secondary | ICD-10-CM

## 2021-11-07 LAB — LIPID PANEL
Chol/HDL Ratio: 3.6 ratio (ref 0.0–5.0)
Cholesterol, Total: 107 mg/dL (ref 100–199)
HDL: 30 mg/dL — ABNORMAL LOW (ref >39)
LDL Chol Calc (NIH): 32 mg/dL (ref 0–99)
Triglycerides: 302 mg/dL — ABNORMAL HIGH (ref 0–149)
VLDL Cholesterol Cal: 45 mg/dL — ABNORMAL HIGH (ref 5–40)

## 2021-11-07 LAB — ALT: ALT: 16 IU/L (ref 0–44)

## 2021-11-08 ENCOUNTER — Other Ambulatory Visit: Payer: Self-pay | Admitting: *Deleted

## 2021-11-08 DIAGNOSIS — E7800 Pure hypercholesterolemia, unspecified: Secondary | ICD-10-CM

## 2021-11-13 ENCOUNTER — Other Ambulatory Visit: Payer: Medicare Other

## 2021-11-13 DIAGNOSIS — E7800 Pure hypercholesterolemia, unspecified: Secondary | ICD-10-CM

## 2021-11-13 LAB — LIPID PANEL
Chol/HDL Ratio: 2.9 ratio (ref 0.0–5.0)
Cholesterol, Total: 101 mg/dL (ref 100–199)
HDL: 35 mg/dL — ABNORMAL LOW (ref >39)
LDL Chol Calc (NIH): 40 mg/dL (ref 0–99)
Triglycerides: 149 mg/dL (ref 0–149)
VLDL Cholesterol Cal: 26 mg/dL (ref 5–40)

## 2021-11-13 LAB — ALT: ALT: 18 IU/L (ref 0–44)

## 2021-11-15 ENCOUNTER — Other Ambulatory Visit: Payer: Medicare Other

## 2022-01-02 ENCOUNTER — Ambulatory Visit (HOSPITAL_COMMUNITY): Payer: Medicare Other | Attending: Cardiovascular Disease

## 2022-01-02 DIAGNOSIS — E7800 Pure hypercholesterolemia, unspecified: Secondary | ICD-10-CM | POA: Insufficient documentation

## 2022-01-02 DIAGNOSIS — I351 Nonrheumatic aortic (valve) insufficiency: Secondary | ICD-10-CM | POA: Diagnosis not present

## 2022-01-02 DIAGNOSIS — I2510 Atherosclerotic heart disease of native coronary artery without angina pectoris: Secondary | ICD-10-CM | POA: Insufficient documentation

## 2022-01-02 DIAGNOSIS — I359 Nonrheumatic aortic valve disorder, unspecified: Secondary | ICD-10-CM | POA: Insufficient documentation

## 2022-01-02 DIAGNOSIS — I77810 Thoracic aortic ectasia: Secondary | ICD-10-CM | POA: Diagnosis not present

## 2022-01-02 LAB — ECHOCARDIOGRAM COMPLETE
Area-P 1/2: 2.73 cm2
P 1/2 time: 318 msec
S' Lateral: 3.9 cm

## 2022-01-03 ENCOUNTER — Ambulatory Visit (HOSPITAL_COMMUNITY)
Admission: RE | Admit: 2022-01-03 | Discharge: 2022-01-03 | Disposition: A | Discharge status: Home / Self Care | Payer: Medicare Other | Source: Ambulatory Visit | Attending: Cardiology | Admitting: Cardiology

## 2022-01-03 DIAGNOSIS — E7800 Pure hypercholesterolemia, unspecified: Secondary | ICD-10-CM | POA: Insufficient documentation

## 2022-01-03 DIAGNOSIS — I77810 Thoracic aortic ectasia: Secondary | ICD-10-CM | POA: Insufficient documentation

## 2022-01-03 DIAGNOSIS — I2510 Atherosclerotic heart disease of native coronary artery without angina pectoris: Secondary | ICD-10-CM | POA: Insufficient documentation

## 2022-01-03 DIAGNOSIS — I359 Nonrheumatic aortic valve disorder, unspecified: Secondary | ICD-10-CM | POA: Insufficient documentation

## 2022-01-03 DIAGNOSIS — I351 Nonrheumatic aortic (valve) insufficiency: Secondary | ICD-10-CM | POA: Insufficient documentation

## 2022-01-03 MED ORDER — IOHEXOL 350 MG/ML SOLN
100.0000 mL | Freq: Once | INTRAVENOUS | Status: AC | PRN
  Administered 2022-01-03: 100 mL via INTRAVENOUS

## 2022-01-05 ENCOUNTER — Telehealth: Payer: Self-pay

## 2022-01-05 ENCOUNTER — Encounter: Payer: Self-pay | Admitting: Cardiology

## 2022-01-05 DIAGNOSIS — I359 Nonrheumatic aortic valve disorder, unspecified: Secondary | ICD-10-CM

## 2022-01-05 DIAGNOSIS — I351 Nonrheumatic aortic (valve) insufficiency: Secondary | ICD-10-CM

## 2022-01-05 NOTE — Telephone Encounter (Signed)
-----   Message from Sueanne Margarita, MD sent at 01/05/2022  1:42 PM EDT ----- Echo showed normal heart function with mildly thickened heart muscle, mildly leaky MV, mildly calcified AV with moderate to severe leakiness of AV.  The echo shows dilated Aortic root but normal on CTA>>it appears that AI has worsened.  Please repeat echo in 6 months

## 2022-01-05 NOTE — Telephone Encounter (Signed)
The patient has been notified of the result and verbalized understanding.  All questions (if any) were answered. Antonieta Iba, RN 01/05/2022 3:04 PM  Echocardiogram has been ordered to be done in 6 months.

## 2022-01-15 ENCOUNTER — Other Ambulatory Visit: Payer: Self-pay | Admitting: Orthopedic Surgery

## 2022-01-15 DIAGNOSIS — Z01818 Encounter for other preprocedural examination: Secondary | ICD-10-CM

## 2022-01-16 ENCOUNTER — Telehealth: Payer: Self-pay | Admitting: *Deleted

## 2022-01-16 NOTE — Telephone Encounter (Signed)
   Pre-operative Risk Assessment    Patient Name: Theodore Patton  DOB: [birth year] MRN: [id number]      Request for Surgical Clearance    Procedure:   LEFT TOTAL SHOULDER REPLACEMENT  Date of Surgery:  Clearance 04/17/22                                 Surgeon:  DR. Marchia Bond Surgeon's Group or Practice Name:  [facility] Phone number:  [phone] Fax number:  [phone] ATTN: Derek Jack   Type of Clearance Requested:   - Medical ; NO MEDICATIONS LISTED AS NEEDING TO BE HELD   Type of Anesthesia:  General    Additional requests/questions:    Jiles Prows   01/16/2022, 6:09 PM

## 2022-01-17 NOTE — Telephone Encounter (Signed)
   Name: RAQUEL SAYRES  DOB: Dec 06, 1945  MRN: [id number]  Primary Cardiologist: Fransico Him, MD  Chart reviewed as part of pre-operative protocol coverage. Because of Pax Reasoner Biggins's past medical history and time since last visit, he will require a follow-up in-office visit in order to better assess preoperative cardiovascular risk.  Pre-op covering staff: - Please schedule appointment and call patient to inform them. If patient already had an upcoming appointment within acceptable timeframe, please add "pre-op clearance" to the appointment notes so provider is aware. - Please contact requesting surgeon's office via preferred method (i.e, phone, fax) to inform them of need for appointment prior to surgery.  Lenna Sciara, NP  01/17/2022, 11:49 AM

## 2022-01-17 NOTE — Telephone Encounter (Signed)
I s/w the pt and he is agreeable to plan of care for in office appt for pre op clearance. Appt is 03/19/22 @ 1:55 with Nicholes Rough, PAC. Pt has also been given a temp pw for MY CHART as he states he cannot remember his pw. PW given today Welcome1

## 2022-01-31 ENCOUNTER — Ambulatory Visit
Admission: RE | Admit: 2022-01-31 | Discharge: 2022-01-31 | Disposition: A | Discharge status: Home / Self Care | Payer: Medicare Other | Source: Ambulatory Visit | Attending: Orthopedic Surgery | Admitting: Orthopedic Surgery

## 2022-01-31 DIAGNOSIS — Z01818 Encounter for other preprocedural examination: Secondary | ICD-10-CM

## 2022-02-12 ENCOUNTER — Ambulatory Visit: Payer: Medicare Other | Attending: Physician Assistant | Admitting: Physician Assistant

## 2022-02-12 ENCOUNTER — Encounter: Payer: Self-pay | Admitting: Physician Assistant

## 2022-02-12 VITALS — BP 150/70 | HR 83 | Ht 73.0 in | Wt 196.0 lb

## 2022-02-12 DIAGNOSIS — I359 Nonrheumatic aortic valve disorder, unspecified: Secondary | ICD-10-CM | POA: Diagnosis not present

## 2022-02-12 DIAGNOSIS — R030 Elevated blood-pressure reading, without diagnosis of hypertension: Secondary | ICD-10-CM | POA: Diagnosis not present

## 2022-02-12 DIAGNOSIS — I77810 Thoracic aortic ectasia: Secondary | ICD-10-CM | POA: Diagnosis not present

## 2022-02-12 DIAGNOSIS — I712 Thoracic aortic aneurysm, without rupture, unspecified: Secondary | ICD-10-CM | POA: Diagnosis not present

## 2022-02-12 NOTE — Patient Instructions (Signed)
Medication Instructions:  Your physician recommends that you continue on your current medications as directed. Please refer to the Current Medication list given to you today. *If you need a refill on your cardiac medications before your next appointment, please call your pharmacy*   Lab Work: None Ordered    Testing/Procedures: None ordered   Follow-Up: At [facility], you and your health needs are our priority.  As part of our continuing mission to provide you with exceptional heart care, we have created designated Provider Care Teams.  These Care Teams include your primary Cardiologist (physician) and Advanced Practice Providers (APPs -  Physician Assistants and Nurse Practitioners) who all work together to provide you with the care you need, when you need it.  We recommend signing up for the patient portal called "MyChart".  Sign up information is provided on this After Visit Summary.  MyChart is used to connect with patients for Virtual Visits (Telemedicine).  Patients are able to view lab/test results, encounter notes, upcoming appointments, etc.  Non-urgent messages can be sent to your provider as well.   To learn more about what you can do with MyChart, go to [email or web address].    Your next appointment:   6 month(s)  The format for your next appointment:   In Person  Provider:   Fransico Him, MD     Other Instructions   Important Information About Sugar

## 2022-02-12 NOTE — Progress Notes (Signed)
Cardiology Office Note:    Date:  02/12/2022   ID:  Theodore Patton, DOB [birth year], MRN [id number]  PCP:  Prince Solian, MD  [facility] Cardiologist:  Fransico Him, MD  [place] Electrophysiologist:  None   Chief Complaint: surgical clearance for L total shoulder replacement   History of Present Illness:    Theodore Patton is a 76 y.o. male with a hx of AI, dilated aortic root HLD and coronary calcifications seen for surgical clearance.   2D echo 01/20/2021 showed normal LV function with EF 60 to 65% with mild LVH, grade 1 diastolic dysfunction with trileaflet aortic valve and calcification of the right coronary cusp leading to mild coaptation and an eccentric jet of AI with moderate AI.  There was no evidence of aortic valve stenosis.  Echo 01/02/2022 with LVEF 60-65%, grade 1 DD, Restricted motion of the right coronary cusp. The aortic valve is  tricuspid. Aortic valve regurgitation is moderate to severe. There is moderate dilatation of the aortic  root, measuring 43 mm. There is mild dilatation of the ascending aorta,  measuring 38 mm. Plan to repeat echo in 6 months.   Here today for surgical clearance.  He walks 30 minutes few days per week without any limitation.  He denies chest pain, shortness of breath, orthopnea, PND, syncope, lower extremity edema or melena.  Intermittent systolic blood pressure in 140 to 150s.  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    Past Surgical History:  Procedure Laterality Date    COLONOSCOPY     PARTIAL KNEE ARTHROPLASTY Right 04/14/2019   Procedure: UNICOMPARTMENTAL KNEE;  Surgeon: Marchia Bond, MD;  Location: [facility];  Service: Orthopedics;  Laterality: Right;   PILONIDAL CYST EXCISION     SHOULDER ARTHROSCOPY Right 2008   TOTAL SHOULDER ARTHROPLASTY Right 07/24/2016   Procedure: TOTAL SHOULDER ARTHROPLASTY;  Surgeon: Marchia Bond, MD;  Location: [facility];  Service: Orthopedics;  Laterality: Right;   UPPER GI ENDOSCOPY      Current Medications: Current Meds  Medication Sig   acetaminophen (TYLENOL) 500 MG tablet Take 1,000 mg by mouth 3 (three) times daily.    allopurinol (ZYLOPRIM) 300 MG tablet Take 300 mg by mouth daily.   baclofen (LIORESAL) 10 MG tablet Take 1 tablet (10 mg total) by mouth 3 (three) times daily. As needed for muscle spasm   buPROPion (WELLBUTRIN XL) 150 MG 24 hr tablet Take 300 mg by mouth daily.   colchicine 0.6 MG tablet Take 0.6 mg by mouth every 6 (six) hours as needed (gout flares).    diphenhydramine-acetaminophen (TYLENOL PM) 25-500 MG TABS tablet Take 2 tablets by mouth at bedtime.   fluticasone (FLONASE) 50 MCG/ACT nasal spray Place 1-2 sprays into both nostrils at bedtime as needed. For allergies/nasal congestion   gabapentin (NEURONTIN) 300 MG capsule Take 600 mg by mouth 3 (three) times daily.   Glucosamine-Chondroitin (COSAMIN DS PO) Take 1 tablet by mouth 2 (two) times daily.    icosapent Ethyl (VASCEPA) 1  g capsule Take 2 capsules (2 g total) by mouth 2 (two) times daily.   loratadine (CLARITIN) 10 MG tablet Take 10 mg by mouth at bedtime.   Multiple Vitamin (MULTIVITAMIN WITH MINERALS) TABS tablet Take 1 tablet by mouth daily. Complete Multivitamin   naproxen (NAPROSYN) 250 MG tablet 2 tablets 2 (two) times daily as needed.   naproxen sodium (ALEVE) 220 MG tablet Take 440 mg by mouth 2 (two) times daily.   ranitidine (ZANTAC) 150 MG tablet Take 150 mg by mouth 2 (two) times daily as needed for heartburn (acid  reflux/indigestion.).   saline (AYR) GEL Place 1 application into the nose every 6 (six) hours as needed (nasal congestion.).   SALINE NASAL MIST NA Place 1 spray into the nose 3 (three) times daily as needed (congestion).   tamsulosin (FLOMAX) 0.4 MG CAPS capsule Take 0.4 mg by mouth at bedtime.   TURMERIC PO Take 1,000 mg by mouth 2 (two) times daily.      Allergies:   Sulfa antibiotics   Social History   Socioeconomic History   Marital status: Married    Spouse name: Not on file   Number of children: Not on file   Years of education: Not on file   Highest education level: Not on file  Occupational History   Not on file  Tobacco Use   Smoking status: Former    Types: Cigarettes    Quit date: 07/12/1980    Years since quitting: 41.6   Smokeless tobacco: Never  Vaping Use   Vaping Use: Never used  Substance and Sexual Activity   Alcohol use: Yes    Alcohol/week: 1.0 standard drink of alcohol    Types: 1 Cans of beer per week    Comment: occ   Drug use: No   Sexual activity: Not on file  Other Topics Concern   Not on file  Social History Narrative   Not on file   Social Determinants of Health   Financial Resource Strain: Not on file  Food Insecurity: Not on file  Transportation Needs: Not on file  Physical Activity: Not on file  Stress: Not on file  Social Connections: Not on file     Family History: The patient's family history includes Healthy in his sister; Heart Problems in his sister; Liver cancer in his brother.    ROS:   Please see the history of present illness.    All other systems reviewed and are negative.   EKGs/Labs/Other Studies Reviewed:    The following studies were reviewed today:  Echo 01/02/2022 1. Left ventricular ejection fraction, by estimation, is 60 to 65%. The  left ventricle has normal function. The left ventricle has no regional  wall motion abnormalities. There is mild concentric left ventricular  hypertrophy. Left ventricular  diastolic  parameters are consistent with Grade I diastolic dysfunction (impaired  relaxation). The average left ventricular global longitudinal strain is  -20.7 %. The global longitudinal strain is normal.   2. Right ventricular systolic function is normal. The right ventricular  size is normal. There is normal pulmonary artery systolic pressure.   3. The mitral valve is normal in structure. Mild mitral valve  regurgitation. No evidence of mitral stenosis.   4. Restricted motion of the right coronary cusp. The aortic valve is  tricuspid. There is mild calcification of the aortic valve. Aortic valve  regurgitation is moderate to severe. No aortic stenosis is present. Aortic  regurgitation PHT measures 318 msec.   5.  Aortic dilatation noted. There is moderate dilatation of the aortic  root, measuring 43 mm. There is mild dilatation of the ascending aorta,  measuring 38 mm.   6. The inferior vena cava is normal in size with greater than 50%  respiratory variability, suggesting right atrial pressure of 3 mmHg.   Comparison(s): 01/20/21 EF 60-65%. PA pressure 4mHg. Moderate AI.  Tricuspid aortic valve.   EKG:  EKG is  ordered today.  The ekg ordered today demonstrates NSR  Recent Labs: 03/07/2021: BUN 17; Creatinine, Ser 1.17; Potassium 4.5; Sodium 138 11/13/2021: ALT 18  Recent Lipid Panel    Component Value Date/Time   CHOL 101 11/13/2021 0000   TRIG 149 11/13/2021 0000   HDL 35 (L) 11/13/2021 0000   CHOLHDL 2.9 11/13/2021 0000   LDLCALC 40 11/13/2021 0000    Physical Exam:    VS:  BP (!) 150/70   Pulse 83   Ht '6\' 1"'$  (1.854 m)   Wt 196 lb (88.9 kg)   SpO2 97%   BMI 25.86 kg/m     Wt Readings from Last 3 Encounters:  02/12/22 196 lb (88.9 kg)  06/19/21 192 lb (87.1 kg)  05/10/20 196 lb (88.9 kg)     GEN:  Well nourished, well developed in no acute distress HEENT: Normal NECK: No JVD; No carotid bruits LYMPHATICS: No lymphadenopathy CARDIAC: RRR, no murmurs, rubs,  gallops RESPIRATORY:  Clear to auscultation without rales, wheezing or rhonchi  ABDOMEN: Soft, non-tender, non-distended MUSCULOSKELETAL:  No edema; No deformity  SKIN: Warm and dry NEUROLOGIC:  Alert and oriented x 3 PSYCHIATRIC:  Normal affect   ASSESSMENT AND PLAN:    Aortic regurgitation W/ aortic root and ascending aorta dilatation Echo 01/02/2022 with LVEF 60-65%, grade 1 DD, Restricted motion of the right coronary cusp. The aortic valve is  tricuspid. Aortic valve regurgitation is moderate to severe. There is moderate dilatation of the aortic  root, measuring 43 mm. There is mild dilatation of the ascending aorta,  measuring 38 mm. Plan to repeat echo in 6 months.   2. Surgical clearance  He is easily getting greater than 4 METS of activity.  He will be clarered at acceptable risk without additional testing. Will send note to requesting provider.  3.  Elevated blood pressure without hypertension Discussed strict blood pressure control below 130/80 given valvular issue.  Intermittent elevated blood pressure.  He is not interested in starting medication today.  Reviewed in detail.  He will keep a log and send [place] review.  We will consider beta-blocker if elevated. HR in 80-90s.   Medication Adjustments/Labs and Tests Ordered: Current medicines are reviewed at length with the patient today.  Concerns regarding medicines are outlined above.  Orders Placed This Encounter  Procedures   EKG 12-Lead   No orders of the defined types were placed in this encounter.   Patient Instructions  Medication Instructions:  Your physician recommends that you continue on your current medications as directed. Please refer to the Current Medication list given to you today. *If you need a refill on your cardiac medications before your next appointment, please call your pharmacy*   Lab Work: None Ordered    Testing/Procedures: None ordered   Follow-Up: [facility] and  your [facility] needs are our priority.  As part of our continuing mission to provide you with exceptional heart care, we have created designated Provider Care Teams.  These Care Teams include your primary Cardiologist (physician) and Advanced Practice  Providers (APPs -  Physician Assistants and Nurse Practitioners) who all work together to provide you with the care you need, when you need it.  We recommend signing up for the patient portal called "MyChart".  Sign up information is provided on this After Visit Summary.  MyChart is used to connect with patients for Virtual Visits (Telemedicine).  Patients are able to view lab/test results, encounter notes, upcoming appointments, etc.  Non-urgent messages can be sent to your provider as well.   To learn more about what you can do with MyChart, go to [email or web address].    Your next appointment:   6 month(s)  The format for your next appointment:   In Person  Provider:   Fransico Him, MD     Other Instructions   Important Information About Sugar         Jarrett Soho, [place]  02/12/2022 2:52 PM    [place]

## 2022-02-23 ENCOUNTER — Inpatient Hospital Stay: Admission: RE | Admit: 2022-02-23 | Payer: Medicare Other | Source: Ambulatory Visit

## 2022-02-28 NOTE — Patient Instructions (Addendum)
DUE TO COVID-19 ONLY TWO VISITORS  (aged 74 and older)  ARE ALLOWED TO COME WITH YOU AND STAY IN THE WAITING ROOM ONLY DURING PRE OP AND PROCEDURE.   **NO VISITORS ARE ALLOWED IN THE SHORT STAY AREA OR RECOVERY ROOM!!**  IF YOU WILL BE ADMITTED INTO THE HOSPITAL YOU ARE ALLOWED ONLY FOUR SUPPORT PEOPLE DURING VISITATION HOURS ONLY (7 AM -8PM)   The support person(s) must pass our screening, gel in and out, and wear a mask at all times, including in the patient's room. Patients must also wear a mask when staff or their support person are in the room. Visitors GUEST BADGE MUST BE WORN VISIBLY  One adult visitor may remain with you overnight and MUST be in the room by 8 P.M.     Your procedure is scheduled on: 03/06/22   Report to [facility] Main Entrance    Report to admitting at : 9:30 AM   Call this number if you have problems the morning of surgery [phone]   Do not eat food :After Midnight.   After Midnight you may have the following liquids until: 9:30 AM DAY OF SURGERY  Water Black Coffee (sugar ok, NO MILK/CREAM OR CREAMERS)  Tea (sugar ok, NO MILK/CREAM OR CREAMERS) regular and decaf                             Plain Jell-O (NO RED)                                           Fruit ices (not with fruit pulp, NO RED)                                     Popsicles (NO RED)                                                                  Juice: apple, WHITE grape, WHITE cranberry Sports drinks like Gatorade (NO RED)              Drink  Ensure drink AT:  9:00 AM the day of surgery.    The day of surgery:  Drink ONE (1) Pre-Surgery Clear Ensure or G2 at AM the morning of surgery. Drink in one sitting. Do not sip.  This drink was given to you during your hospital  pre-op appointment visit. Nothing else to drink after completing the  Pre-Surgery Clear Ensure or G2.          If you have questions, please contact your surgeon's office.   Oral Hygiene is also  important to reduce your risk of infection.                                    Remember - BRUSH YOUR TEETH THE MORNING OF SURGERY WITH YOUR REGULAR TOOTHPASTE   Do NOT smoke after Midnight   Take these medicines the morning of surgery with A SIP OF  WATER: gabapentin,bupropion,allopurinol.Colchicine,tylenol as needed.  DO NOT TAKE ANY ORAL DIABETIC MEDICATIONS DAY OF YOUR SURGERY  Bring CPAP mask and tubing day of surgery.                              You may not have any metal on your body including hair pins, jewelry, and body piercing             Do not wear lotions, powders, perfumes/cologne, or deodorant              Men may shave face and neck.   Do not bring valuables to the hospital. [place].   Contacts, dentures or bridgework may not be worn into surgery.   Bring small overnight bag day of surgery.   DO NOT [place]. PHARMACY WILL DISPENSE MEDICATIONS LISTED ON YOUR MEDICATION LIST TO YOU DURING YOUR ADMISSION [place]!    Patients discharged on the day of surgery will not be allowed to drive home.  Someone NEEDS to stay with you for the first 24 hours after anesthesia.   Special Instructions: Bring a copy of your healthcare power of attorney and living will documents         the day of surgery if you haven't scanned them before.              Please read over the following fact sheets you were given: IF YOU HAVE QUESTIONS ABOUT YOUR PRE-OP INSTRUCTIONS PLEASE CALL [phone]     [facility] - Preparing for Surgery Before surgery, you can play an important role.  Because skin is not sterile, your skin needs to be as free of germs as possible.  You can reduce the number of germs on your skin by washing with CHG (chlorahexidine gluconate) soap before surgery.  CHG is an antiseptic cleaner which kills germs and bonds with the skin to continue killing germs even after washing. Please  DO NOT use if you have an allergy to CHG or antibacterial soaps.  If your skin becomes reddened/irritated stop using the CHG and inform your nurse when you arrive at Short Stay. Do not shave (including legs and underarms) for at least 48 hours prior to the first CHG shower.  You may shave your face/neck. Please follow these instructions carefully:  1.  Shower with CHG Soap the night before surgery and the  morning of Surgery.  2.  If you choose to wash your hair, wash your hair first as usual with your  normal  shampoo.  3.  After you shampoo, rinse your hair and body thoroughly to remove the  shampoo.                           4.  Use CHG as you would any other liquid soap.  You can apply chg directly  to the skin and wash                       Gently with a scrungie or clean washcloth.  5.  Apply the CHG Soap to your body ONLY FROM THE NECK DOWN.   Do not use on face/ open  Wound or open sores. Avoid contact with eyes, ears mouth and genitals (private parts).                       Wash face,  Genitals (private parts) with your normal soap.             6.  Wash thoroughly, paying special attention to the area where your surgery  will be performed.  7.  Thoroughly rinse your body with warm water from the neck down.  8.  DO NOT shower/wash with your normal soap after using and rinsing off  the CHG Soap.                9.  Pat yourself dry with a clean towel.            10.  Wear clean pajamas.            11.  Place clean sheets on your bed the night of your first shower and do not  sleep with pets. Day of Surgery : Do not apply any lotions/deodorants the morning of surgery.  Please wear clean clothes to the hospital/surgery center.  FAILURE TO FOLLOW THESE INSTRUCTIONS MAY RESULT IN THE CANCELLATION OF YOUR SURGERY PATIENT SIGNATURE_________________________________  NURSE  SIGNATURE__________________________________  ________________________________________________________________________ [facility]- Preparing for Total Shoulder Arthroplasty    Before surgery, you can play an important role. Because skin is not sterile, your skin needs to be as free of germs as possible. You can reduce the number of germs on your skin by using the following products. Benzoyl Peroxide Gel Reduces the number of germs present on the skin Applied twice a day to shoulder area starting two days before surgery    ==================================================================  Please follow these instructions carefully:  BENZOYL PEROXIDE 5% GEL  Please do not use if you have an allergy to benzoyl peroxide.   If your skin becomes reddened/irritated stop using the benzoyl peroxide.  Starting two days before surgery, apply as follows: Apply benzoyl peroxide in the morning and at night. Apply after taking a shower. If you are not taking a shower clean entire shoulder front, back, and side along with the armpit with a clean wet washcloth.  Place a quarter-sized dollop on your shoulder and rub in thoroughly, making sure to cover the front, back, and side of your shoulder, along with the armpit.   2 days before ____ AM   ____ PM              1 day before ____ AM   ____ PM                         Do this twice a day for two days.  (Last application is the night before surgery, AFTER using the CHG soap as described below).  Do NOT apply benzoyl peroxide gel on the day of surgery.   Incentive Spirometer  An incentive spirometer is a tool that can help keep your lungs clear and active. This tool measures how well you are filling your lungs with each breath. Taking long deep breaths may help reverse or decrease the chance of developing breathing (pulmonary) problems (especially infection) following: A long period of time when you are unable to move or be active. BEFORE THE PROCEDURE   If the spirometer includes an indicator to show your best effort, your nurse or respiratory therapist will set it to a desired goal. If possible,  sit up straight or lean slightly forward. Try not to slouch. Hold the incentive spirometer in an upright position. INSTRUCTIONS FOR USE  Sit on the edge of your bed if possible, or sit up as far as you can in bed or on a chair. Hold the incentive spirometer in an upright position. Breathe out normally. Place the mouthpiece in your mouth and seal your lips tightly around it. Breathe in slowly and as deeply as possible, raising the piston or the ball toward the top of the column. Hold your breath for 3-5 seconds or for as long as possible. Allow the piston or ball to fall to the bottom of the column. Remove the mouthpiece from your mouth and breathe out normally. Rest for a few seconds and repeat Steps 1 through 7 at least 10 times every 1-2 hours when you are awake. Take your time and take a few normal breaths between deep breaths. The spirometer may include an indicator to show your best effort. Use the indicator as a goal to work toward during each repetition. After each set of 10 deep breaths, practice coughing to be sure your lungs are clear. If you have an incision (the cut made at the time of surgery), support your incision when coughing by placing a pillow or rolled up towels firmly against it. Once you are able to get out of bed, walk around indoors and cough well. You may stop using the incentive spirometer when instructed by your caregiver.  RISKS AND COMPLICATIONS Take your time so you do not get dizzy or light-headed. If you are in pain, you may need to take or ask for pain medication before doing incentive spirometry. It is harder to take a deep breath if you are having pain. AFTER USE Rest and breathe slowly and easily. It can be helpful to keep track of a log of your progress. Your caregiver can provide you with a simple table to help  with this. If you are using the spirometer at home, follow these instructions: [place] IF:  You are having difficultly using the spirometer. You have trouble using the spirometer as often as instructed. Your pain medication is not giving enough relief while using the spirometer. You develop fever of 100.5 F (38.1 C) or higher. SEEK IMMEDIATE MEDICAL CARE IF:  You cough up bloody sputum that had not been present before. You develop fever of 102 F (38.9 C) or greater. You develop worsening pain at or near the incision site. MAKE SURE YOU:  Understand these instructions. Will watch your condition. Will get help right away if you are not doing well or get worse. Document Released: 10/01/2006 Document Revised: 08/13/2011 Document Reviewed: 12/02/2006 [facility] Patient Information 2014 [place], [place].   ________________________________________________________________________

## 2022-03-01 ENCOUNTER — Encounter (HOSPITAL_COMMUNITY): Payer: Self-pay

## 2022-03-01 ENCOUNTER — Other Ambulatory Visit: Payer: Self-pay

## 2022-03-01 ENCOUNTER — Encounter (HOSPITAL_COMMUNITY)
Admission: RE | Admit: 2022-03-01 | Discharge: 2022-03-01 | Disposition: A | Discharge status: Home / Self Care | Payer: Medicare Other | Source: Ambulatory Visit | Attending: Orthopedic Surgery | Admitting: Orthopedic Surgery

## 2022-03-01 VITALS — BP 151/70 | HR 77 | Temp 97.5°F | Ht 73.0 in | Wt 181.0 lb

## 2022-03-01 DIAGNOSIS — M19012 Primary osteoarthritis, left shoulder: Secondary | ICD-10-CM | POA: Insufficient documentation

## 2022-03-01 DIAGNOSIS — Z01818 Encounter for other preprocedural examination: Secondary | ICD-10-CM | POA: Insufficient documentation

## 2022-03-01 DIAGNOSIS — I2510 Atherosclerotic heart disease of native coronary artery without angina pectoris: Secondary | ICD-10-CM

## 2022-03-01 HISTORY — DX: Other specified postprocedural states: Z98.890

## 2022-03-01 HISTORY — DX: Other specified postprocedural states: R11.2

## 2022-03-01 HISTORY — DX: Other complications of anesthesia, initial encounter: T88.59XA

## 2022-03-01 LAB — BASIC METABOLIC PANEL
Anion gap: 9 (ref 5–15)
BUN: 18 mg/dL (ref 8–23)
CO2: 25 mmol/L (ref 22–32)
Calcium: 9.8 mg/dL (ref 8.9–10.3)
Chloride: 106 mmol/L (ref 98–111)
Creatinine, Ser: 1.04 mg/dL (ref 0.61–1.24)
GFR, Estimated: >60 mL/min (ref >60)
Glucose, Bld: 102 mg/dL — ABNORMAL HIGH (ref 70–99)
Potassium: 4 mmol/L (ref 3.5–5.1)
Sodium: 140 mmol/L (ref 135–145)

## 2022-03-01 LAB — CBC
HCT: 39.5 % (ref 39.0–52.0)
Hemoglobin: 13.4 g/dL (ref 13.0–17.0)
MCH: 33.5 pg (ref 26.0–34.0)
MCHC: 33.9 g/dL (ref 30.0–36.0)
MCV: 98.8 fL (ref 80.0–100.0)
Platelets: 120 10*3/uL — ABNORMAL LOW (ref 150–400)
RBC: 4 MIL/uL — ABNORMAL LOW (ref 4.22–5.81)
RDW: 13 % (ref 11.5–15.5)
WBC: 4.5 10*3/uL (ref 4.0–10.5)
nRBC: 0 % (ref 0.0–0.2)

## 2022-03-01 LAB — SURGICAL PCR SCREEN
MRSA, PCR: NEGATIVE
Staphylococcus aureus: NEGATIVE

## 2022-03-01 NOTE — Progress Notes (Signed)
For Short Stay: [facility] appointment date: Date of COVID positive in last 77 days:  Bowel Prep reminder:   For Anesthesia: PCP - Dr. Steva Ready Avva: Clearance: 02/12/22: [facility] Cardiologist - Dr. Fransico Him  Chest x-ray -  EKG - 02/12/22 Stress Test -  ECHO - 01/05/22 Cardiac Cath -  Pacemaker/ICD device last checked: Pacemaker orders received: Device Rep notified:  Spinal Cord Stimulator:  Sleep Study -  CPAP -   Fasting Blood Sugar -  Checks Blood Sugar _____ times a day Date and result of last Hgb A1c-  Blood Thinner Instructions: Aspirin Instructions: Last Dose:  Activity level: Can go up a flight of stairs and activities of daily living without stopping and without chest pain and/or shortness of breath   Able to exercise without chest pain and/or shortness of breath   Unable to go up a flight of stairs without chest pain and/or shortness of breath     Anesthesia review: Hx: Heart murmur,CAD  Patient denies shortness of breath, fever, cough and chest pain at PAT appointment   Patient verbalized understanding of instructions that were given to them at the PAT appointment. Patient was also instructed that they will need to review over the PAT instructions again at home before surgery.

## 2022-03-02 NOTE — Anesthesia Preprocedure Evaluation (Signed)
Anesthesia Evaluation  Patient identified by MRN, date of birth, ID band Patient awake    Reviewed: Allergy & Precautions, NPO status , Patient's Chart, lab work & pertinent test results  History of Anesthesia Complications (+) PONV and history of anesthetic complications  Airway Mallampati: II  TM Distance: >3 FB Neck ROM: Full    Dental  (+) Dental Advisory Given, Chipped   Pulmonary former smoker,    Pulmonary exam normal        Cardiovascular + CAD  Normal cardiovascular exam+ Valvular Problems/Murmurs AI    '23 TTE - EF 60 to 65%. There is mild concentric left ventricular hypertrophy. Grade I diastolic dysfunction (impaired relaxation). Mild mitral valve regurgitation. Aortic valve regurgitation is moderate to severe. No aortic stenosis is present. There is moderate dilatation of the aortic root, measuring 43 mm. There is mild dilatation of the ascending aorta, measuring 38 mm.     Neuro/Psych negative neurological ROS  negative psych ROS   GI/Hepatic Neg liver ROS, GERD  Controlled and Medicated,  Endo/Other  negative endocrine ROS  Renal/GU negative Renal ROS     Musculoskeletal  (+) Arthritis ,  Gout    Abdominal   Peds  Hematology  Plt 120k    Anesthesia Other Findings   Reproductive/Obstetrics                           Anesthesia Physical Anesthesia Plan  ASA: 3  Anesthesia Plan: General   Post-op Pain Management: Regional block* and Tylenol PO (pre-op)*   Induction: Intravenous  PONV Risk Score and Plan: 3 and Treatment may vary due to age or medical condition, Ondansetron, Dexamethasone and Propofol infusion  Airway Management Planned: Oral ETT  Additional Equipment: None  Intra-op Plan:   Post-operative Plan: Extubation in OR  Informed Consent: I have reviewed the patients History and Physical, chart, labs and discussed the procedure including the risks,  benefits and alternatives for the proposed anesthesia with the patient or authorized representative who has indicated his/her understanding and acceptance.     Dental advisory given  Plan Discussed with: CRNA and Anesthesiologist  Anesthesia Plan Comments: (See PAT note)      Anesthesia Quick Evaluation

## 2022-03-02 NOTE — Progress Notes (Signed)
Anesthesia Chart Review   Case: [id number] Date/Time: 03/06/22 1150   Procedure: TOTAL SHOULDER ARTHROPLASTY (Left: Shoulder)   Anesthesia type: Choice   Pre-op diagnosis: djd left shoulder   Location: [place] / [facility]   Surgeons: Marchia Bond, MD       DISCUSSION:76 y.o. former smoker with h/o PONV, moderate to severe aortic regurgitation without stenosis, left shoulder djd scheduled for above procedure 03/06/2022 with Dr. Marchia Bond.   Pt last seen by cardiology 02/12/2022.   Echo 01/02/2022 with LVEF 60-65%, grade 1 DD, Restricted motion of the right coronary cusp. The aortic valve is  tricuspid. Aortic valve regurgitation is moderate to severe. There is moderate dilatation of the aortic  root, measuring 43 mm. There is mild dilatation of the ascending aorta,  measuring 38 mm. Plan to repeat echo in 6 months.   Per note, "He is easily getting greater than 4 METS of activity.  He will be clarered at acceptable risk without additional testing. Will send note to requesting provider."  Anticipate pt can proceed with planned procedure barring acute status change.   VS: BP (!) 151/70   Pulse 77   Temp (!) 36.4 C (Oral)   Ht '6\' 1"'$  (1.854 m)   Wt 82.1 kg   SpO2 97%   BMI 23.88 kg/m   PROVIDERS: Prince Solian, MD is PCP   [facility] Cardiologist:  Fransico Him, MD  LABS: Labs reviewed: Acceptable for surgery. (all labs ordered are listed, but only abnormal results are displayed)  Labs Reviewed  BASIC METABOLIC PANEL - Abnormal; Notable for the following components:      Result Value   Glucose, Bld 102 (*)    All other components within normal limits  CBC - Abnormal; Notable for the following components:   RBC 4.00 (*)    Platelets 120 (*)    All other components within normal limits  SURGICAL PCR SCREEN     IMAGES: CT Angio Chest 02/24/2021 IMPRESSION: 1. No evidence of thoracic aneurysm or dissection. 2. Coronary artery calcifications. Aortic  Atherosclerosis (ICD10-I70.0). 3. Suspected hepatic steatosis.  Correlation with LFTs is advised.  EKG:   CV: Echo 01/02/2022  1. Left ventricular ejection fraction, by estimation, is 60 to 65%. The  left ventricle has normal function. The left ventricle has no regional  wall motion abnormalities. There is mild concentric left ventricular  hypertrophy. Left ventricular diastolic  parameters are consistent with Grade I diastolic dysfunction (impaired  relaxation). The average left ventricular global longitudinal strain is  -20.7 %. The global longitudinal strain is normal.   2. Right ventricular systolic function is normal. The right ventricular  size is normal. There is normal pulmonary artery systolic pressure.   3. The mitral valve is normal in structure. Mild mitral valve  regurgitation. No evidence of mitral stenosis.   4. Restricted motion of the right coronary cusp. The aortic valve is  tricuspid. There is mild calcification of the aortic valve. Aortic valve  regurgitation is moderate to severe. No aortic stenosis is present. Aortic  regurgitation PHT measures 318 msec.   5. Aortic dilatation noted. There is moderate dilatation of the aortic  root, measuring 43 mm. There is mild dilatation of the ascending aorta,  measuring 38 mm.   6. The inferior vena cava is normal in size with greater than 50%  respiratory variability, suggesting right atrial pressure of 3 mmHg.  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    Past Surgical History:  Procedure Laterality Date   COLONOSCOPY     PARTIAL KNEE ARTHROPLASTY Right 04/14/2019   Procedure: UNICOMPARTMENTAL KNEE;  Surgeon: Marchia Bond, MD;  Location: [facility];  Service: Orthopedics;  Laterality: Right;   PILONIDAL CYST EXCISION     SHOULDER ARTHROSCOPY Right 2008   TOTAL SHOULDER ARTHROPLASTY Right 07/24/2016   Procedure: TOTAL SHOULDER ARTHROPLASTY;  Surgeon: Marchia Bond, MD;  Location: [place];  Service: Orthopedics;  Laterality: Right;   UPPER GI ENDOSCOPY      MEDICATIONS:  acetaminophen (TYLENOL) 650 MG CR tablet   allopurinol (ZYLOPRIM) 300 MG tablet   buPROPion (WELLBUTRIN XL) 150 MG 24 hr tablet   cetirizine (ZYRTEC) 10 MG tablet   colchicine 0.6 MG tablet   diclofenac Sodium (VOLTAREN) 1 % GEL   diphenhydramine-acetaminophen (TYLENOL PM) 25-500 MG TABS tablet   famotidine (PEPCID) 20 MG tablet   fluticasone (FLONASE) 50 MCG/ACT nasal spray   gabapentin (NEURONTIN) 300 MG capsule   Glucosamine-Chondroitin (COSAMIN DS PO)   Multiple Vitamin (MULTIVITAMIN WITH MINERALS) TABS tablet   naproxen sodium (ALEVE) 220 MG tablet   Omega-3 Fatty Acids (FISH OIL) 1000 MG CAPS   Probiotic Product (ALIGN) 4 MG CAPS   rosuvastatin (CRESTOR) 10 MG tablet   SALINE NASAL MIST NA   tamsulosin (FLOMAX) 0.4 MG CAPS capsule   TURMERIC PO   No current facility-administered medications for this encounter.    Konrad Felix Ward, PA-C [facility] [phone]

## 2022-03-05 NOTE — H&P (Signed)
SHOULDER ARTHROPLASTY ADMISSION H&P  Patient ID: Theodore Patton MRN: [id number] DOB/AGE: [birth year] 74 y.o.  Chief Complaint: left shoulder pain.  Planned Procedure Date: 03/06/22 Medical Clearance by Dr. Doneen Poisson Cardiac Clearance by Leanor Kail PA   HPI: Theodore Patton is a 76 y.o. male who presents for evaluation of djd left shoulder. The patient has a history of pain and functional disability in the left shoulder due to arthritis and has failed non-surgical conservative treatments for greater than 12 weeks to include NSAID's and/or analgesics and activity modification.  Onset of symptoms was gradual, starting 5 years ago with gradually worsening course since that time. The patient noted no past surgery on the left shoulder.  Patient currently rates pain at 10 out of 10 with activity. Patient has night pain, worsening of pain with activity and weight bearing, pain that interferes with activities of daily living, and pain with passive range of motion.  Patient has evidence of joint space narrowing by imaging studies.  There is no active infection.  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   Past Surgical History:  Procedure Laterality Date   COLONOSCOPY     PARTIAL KNEE ARTHROPLASTY Right 04/14/2019   Procedure: UNICOMPARTMENTAL KNEE;  Surgeon: Marchia Bond, MD;  Location: [facility];  Service: Orthopedics;  Laterality: Right;   PILONIDAL CYST EXCISION      SHOULDER ARTHROSCOPY Right 2008   TOTAL SHOULDER ARTHROPLASTY Right 07/24/2016   Procedure: TOTAL SHOULDER ARTHROPLASTY;  Surgeon: Marchia Bond, MD;  Location: [place];  Service: Orthopedics;  Laterality: Right;   UPPER GI ENDOSCOPY     Allergies  Allergen Reactions   Sulfa Antibiotics Other (See Comments)    Blisters in mouth   Prior to Admission medications   Medication Sig Start Date End Date Taking? Authorizing Provider  acetaminophen (TYLENOL) 650 MG CR tablet Take 1,300 mg by mouth 2 (two) times daily.   Yes [provider]  allopurinol (ZYLOPRIM) 300 MG tablet Take 300 mg by mouth daily. 05/16/16  Yes [provider]  buPROPion (WELLBUTRIN XL) 150 MG 24 hr tablet Take 300 mg by mouth daily. 10/22/21  Yes [provider]  cetirizine (ZYRTEC) 10 MG tablet Take 10 mg by mouth at bedtime.   Yes [provider]  colchicine 0.6 MG tablet Take 0.6 mg by mouth every 6 (six) hours as needed (gout flares).  01/26/19  Yes [provider]  diclofenac Sodium (VOLTAREN) 1 % GEL Apply 1 Application topically 3 (three) times daily as needed (shoulder pain).   Yes [provider]  diphenhydramine-acetaminophen (TYLENOL PM) 25-500 MG TABS tablet Take 2 tablets by mouth at bedtime.   Yes [provider]  famotidine (PEPCID) 20 MG tablet Take 20 mg by mouth at bedtime.   Yes [provider]  fluticasone (FLONASE) 50 MCG/ACT nasal spray Place 1-2 sprays into both nostrils at bedtime as needed for allergies. 06/11/16  Yes  [provider]  gabapentin (NEURONTIN) 300 MG capsule Take 600 mg by mouth 3 (three) times daily. 04/03/16  Yes [provider]  Glucosamine-Chondroitin (COSAMIN DS PO) Take 1 tablet by mouth daily.   Yes [provider]  Multiple Vitamin (MULTIVITAMIN WITH MINERALS) TABS tablet Take 1 tablet by mouth daily. Complete Multivitamin   Yes [provider]  naproxen sodium (ALEVE) 220 MG  tablet Take 440 mg by mouth 2 (two) times daily.   Yes [provider]  Omega-3 Fatty Acids (FISH OIL) 1000 MG CAPS Take 1,000 mg by mouth daily.   Yes [provider]  Probiotic Product (ALIGN) 4 MG CAPS Take 4 mg by mouth daily as needed (gut health).   Yes [provider]  rosuvastatin (CRESTOR) 10 MG tablet Take 10 mg by mouth daily.   Yes [provider]  SALINE NASAL MIST NA Place 1 spray into the nose 3 (three) times daily as needed (congestion).   Yes [provider]  tamsulosin (FLOMAX) 0.4 MG CAPS capsule Take 0.8 mg by mouth every evening. 05/16/16  Yes [provider]  TURMERIC PO Take 1,000 mg by mouth daily.   Yes [provider]   Social History   Socioeconomic History   Marital status: Married    Spouse name: Not on file   Number of children: Not on file   Years of education: Not on file   Highest education level: Not on file  Occupational History   Not on file  Tobacco Use   Smoking status: Former    Types: Cigarettes    Quit date: 07/12/1980    Years since quitting: 41.6   Smokeless tobacco: Never  Vaping Use   Vaping Use: Never used  Substance and Sexual Activity   Alcohol use: Yes    Alcohol/week: 1.0 standard drink of alcohol    Types: 1 Cans of beer per week    Comment: occ   Drug use: No   Sexual activity: Not on file  Other Topics Concern   Not on file  Social History Narrative   Not on file   Social Determinants of Health   Financial Resource Strain: Not on file  Food Insecurity: Not on file  Transportation Needs: Not on file  Physical Activity: Not on file  Stress: Not on file  Social Connections: Not on file   Family History  Problem Relation Age of Onset   Heart Problems Sister        possibly cause of death, unknown specifics   Liver cancer Brother        as an infant   Healthy Sister     ROS: Currently denies lightheadedness, dizziness, Fever, chills, CP, SOB.   No  personal history of DVT, PE, MI, or CVA. No loose teeth or dentures All other systems have been reviewed and were otherwise currently negative with the exception of those mentioned in the HPI and as above.  BMI: Estimated body mass index is 23.88 kg/m as calculated from the following:   Height as of 03/01/22: '6\' 1"'$  (1.854 m).   Weight as of 03/01/22: 82.1 kg.  Lab Results  Component Value Date   ALBUMIN 4.4 12/17/2016   Diabetes: Patient does not have a diagnosis of diabetes.     Smoking Status: Social History   Tobacco Use  Smoking Status Former   Types: Cigarettes   Quit date: 07/12/1980   Years since quitting: 41.6  Smokeless Tobacco Never   The  patient is not currently a tobacco user. Counseling given: Not Answered      Objective: Vitals: Ht: '6\' 0"'$  Wt: 194.8 lbs Temp: 97.5 BP: 156/68 Pulse: 62 O2 97% on room air.   Physical Exam: General: Alert, NAD.   HEENT: EOMI, Good Neck Extension  Pulm: No increased work of breathing.  Clear B/L A/P w/o crackle or wheeze.  CV: RRR, No m/g/r appreciated  GI: soft, NT, ND Neuro: Neuro without gross focal deficit.  Sensation intact distally Skin: No lesions in the area of chief complaint MSK/Surgical Site: left shoulder pain with range of motion.  Forward flexion/abduction approximately 0-90 deg.  Internal rotation to mid left buttock.  External rotation to 20 deg.  No AC pain.   Fair Rotator cuff strength.  NVI distally.  Imaging Review Plain radiographs and CT scan demonstrate severe degenerative joint disease of the left shoulder with significant glenoid wear.   Assessment: djd left shoulder   Plan: Plan for Procedure(s): TOTAL SHOULDER ARTHROPLASTY vs. REVERSE TOTAL SHOULDER ARTHROPLASTY  The patient history, physical exam, clinical judgement of the provider and imaging are consistent with end stage degenerative joint disease and total joint arthroplasty is deemed medically necessary. The treatment options including  medical management, injection therapy, and arthroplasty were discussed at length. The risks and benefits of Procedure(s): TOTAL SHOULDER ARTHROPLASTY were presented and reviewed.  The risks of nonoperative treatment, versus surgical intervention including but not limited to continued pain, aseptic loosening, stiffness, dislocation/subluxation, infection, bleeding, nerve injury, blood clots, cardiopulmonary complications, morbidity, mortality, among others were discussed. The patient verbalizes understanding and wishes to proceed with the plan.  Patient is being admitted for surgery, OT, pain control, prophylactic antibiotics, VTE prophylaxis, progressive ambulation, ADL's and discharge planning.   Dental prophylaxis discussed and recommended for 2 years postoperatively.  The patient does meet the criteria for TXA which will be used perioperatively.   The patient is planning to be discharged home care of his wife.   Ventura Bruns, PA-C 03/05/2022 8:44 PM

## 2022-03-06 ENCOUNTER — Other Ambulatory Visit: Payer: Self-pay

## 2022-03-06 ENCOUNTER — Ambulatory Visit (HOSPITAL_BASED_OUTPATIENT_CLINIC_OR_DEPARTMENT_OTHER): Payer: Medicare Other | Admitting: Anesthesiology

## 2022-03-06 ENCOUNTER — Observation Stay (HOSPITAL_COMMUNITY): Payer: Medicare Other

## 2022-03-06 ENCOUNTER — Ambulatory Visit (HOSPITAL_COMMUNITY): Payer: Medicare Other | Admitting: Physician Assistant

## 2022-03-06 ENCOUNTER — Observation Stay (HOSPITAL_COMMUNITY)
Admission: RE | Admit: 2022-03-06 | Discharge: 2022-03-07 | Disposition: A | Discharge status: Home / Self Care | Payer: Medicare Other | Source: Ambulatory Visit | Attending: Orthopedic Surgery | Admitting: Orthopedic Surgery

## 2022-03-06 ENCOUNTER — Encounter (HOSPITAL_COMMUNITY)
Admission: RE | Disposition: A | Discharge status: Home / Self Care | Payer: Self-pay | Source: Ambulatory Visit | Attending: Orthopedic Surgery

## 2022-03-06 ENCOUNTER — Encounter (HOSPITAL_COMMUNITY): Payer: Self-pay | Admitting: Orthopedic Surgery

## 2022-03-06 DIAGNOSIS — I2510 Atherosclerotic heart disease of native coronary artery without angina pectoris: Secondary | ICD-10-CM

## 2022-03-06 DIAGNOSIS — Z96611 Presence of right artificial shoulder joint: Secondary | ICD-10-CM | POA: Insufficient documentation

## 2022-03-06 DIAGNOSIS — M84812 Other disorders of continuity of bone, left shoulder: Secondary | ICD-10-CM | POA: Insufficient documentation

## 2022-03-06 DIAGNOSIS — Z87891 Personal history of nicotine dependence: Secondary | ICD-10-CM

## 2022-03-06 DIAGNOSIS — M19012 Primary osteoarthritis, left shoulder: Principal | ICD-10-CM | POA: Insufficient documentation

## 2022-03-06 DIAGNOSIS — Z96612 Presence of left artificial shoulder joint: Secondary | ICD-10-CM

## 2022-03-06 DIAGNOSIS — Z96651 Presence of right artificial knee joint: Secondary | ICD-10-CM | POA: Diagnosis not present

## 2022-03-06 HISTORY — PX: TOTAL SHOULDER ARTHROPLASTY: SHX126

## 2022-03-06 SURGERY — ARTHROPLASTY, SHOULDER, TOTAL
Anesthesia: General | Site: Shoulder | Laterality: Left

## 2022-03-06 MED ORDER — ONDANSETRON HCL 4 MG/2ML IJ SOLN
INTRAMUSCULAR | Status: AC
  Filled 2022-03-06: qty 2

## 2022-03-06 MED ORDER — FAMOTIDINE 20 MG PO TABS: 20.0000 mg | ORAL_TABLET | Freq: Every day | ORAL | Status: DC

## 2022-03-06 MED ORDER — PHENYLEPHRINE HCL (PRESSORS) 10 MG/ML IV SOLN
INTRAVENOUS | Status: AC
  Filled 2022-03-06: qty 1

## 2022-03-06 MED ORDER — ROCURONIUM BROMIDE 100 MG/10ML IV SOLN
INTRAVENOUS | Status: DC | PRN
  Administered 2022-03-06: 60 mg via INTRAVENOUS

## 2022-03-06 MED ORDER — BUPIVACAINE LIPOSOME 1.3 % IJ SUSP
INTRAMUSCULAR | Status: DC | PRN
  Administered 2022-03-06: 10 mL via PERINEURAL

## 2022-03-06 MED ORDER — EPHEDRINE 5 MG/ML INJ
INTRAVENOUS | Status: AC
  Filled 2022-03-06: qty 5

## 2022-03-06 MED ORDER — OXYCODONE HCL 5 MG/5ML PO SOLN: 5.0000 mg | Freq: Once | ORAL | Status: DC | PRN

## 2022-03-06 MED ORDER — MIDAZOLAM HCL 2 MG/2ML IJ SOLN
1.0000 mg | Freq: Once | INTRAMUSCULAR | Status: DC
  Filled 2022-03-06: qty 2

## 2022-03-06 MED ORDER — POLYETHYLENE GLYCOL 3350 17 G PO PACK: 17.0000 g | PACK | Freq: Every day | ORAL | Status: DC | PRN

## 2022-03-06 MED ORDER — ACETAMINOPHEN 500 MG PO TABS: 1000.0000 mg | ORAL_TABLET | Freq: Once | ORAL | Status: DC

## 2022-03-06 MED ORDER — LIDOCAINE HCL (CARDIAC) PF 100 MG/5ML IV SOSY
PREFILLED_SYRINGE | INTRAVENOUS | Status: DC | PRN
  Administered 2022-03-06: 50 mg via INTRAVENOUS

## 2022-03-06 MED ORDER — FENTANYL CITRATE (PF) 100 MCG/2ML IJ SOLN
INTRAMUSCULAR | Status: AC
  Filled 2022-03-06: qty 2

## 2022-03-06 MED ORDER — ACETAMINOPHEN 325 MG PO TABS: 325.0000 mg | ORAL_TABLET | Freq: Four times a day (QID) | ORAL | Status: DC | PRN

## 2022-03-06 MED ORDER — PROPOFOL 10 MG/ML IV BOLUS
INTRAVENOUS | Status: AC
  Filled 2022-03-06: qty 20

## 2022-03-06 MED ORDER — PHENYLEPHRINE HCL-NACL 20-0.9 MG/250ML-% IV SOLN
INTRAVENOUS | Status: DC | PRN
  Administered 2022-03-06: 30 ug/min via INTRAVENOUS

## 2022-03-06 MED ORDER — MENTHOL 3 MG MT LOZG: 1.0000 | LOZENGE | OROMUCOSAL | Status: DC | PRN

## 2022-03-06 MED ORDER — ALUM & MAG HYDROXIDE-SIMETH 200-200-20 MG/5ML PO SUSP: 30.0000 mL | ORAL | Status: DC | PRN

## 2022-03-06 MED ORDER — METOCLOPRAMIDE HCL 5 MG PO TABS
5.0000 mg | ORAL_TABLET | Freq: Three times a day (TID) | ORAL | Status: DC | PRN
  Filled 2022-03-06: qty 2

## 2022-03-06 MED ORDER — FENTANYL CITRATE PF 50 MCG/ML IJ SOSY: 25.0000 ug | PREFILLED_SYRINGE | INTRAMUSCULAR | Status: DC | PRN

## 2022-03-06 MED ORDER — OXYCODONE HCL 5 MG PO TABS: 10.0000 mg | ORAL_TABLET | ORAL | Status: DC | PRN

## 2022-03-06 MED ORDER — TRANEXAMIC ACID-NACL 1000-0.7 MG/100ML-% IV SOLN
1000.0000 mg | Freq: Once | INTRAVENOUS | Status: AC
  Administered 2022-03-06: 1000 mg via INTRAVENOUS
  Filled 2022-03-06: qty 100

## 2022-03-06 MED ORDER — GABAPENTIN 300 MG PO CAPS
600.0000 mg | ORAL_CAPSULE | Freq: Three times a day (TID) | ORAL | Status: DC
  Administered 2022-03-06 – 2022-03-07 (×3): 600 mg via ORAL
  Filled 2022-03-06 (×3): qty 2

## 2022-03-06 MED ORDER — GLYCOPYRROLATE 0.2 MG/ML IJ SOLN
INTRAMUSCULAR | Status: AC
  Filled 2022-03-06: qty 1

## 2022-03-06 MED ORDER — MIDAZOLAM HCL 5 MG/5ML IJ SOLN
INTRAMUSCULAR | Status: DC | PRN
  Administered 2022-03-06: 2 mg via INTRAVENOUS

## 2022-03-06 MED ORDER — PROPOFOL 10 MG/ML IV BOLUS
INTRAVENOUS | Status: DC | PRN
  Administered 2022-03-06: 140 mg via INTRAVENOUS

## 2022-03-06 MED ORDER — METHOCARBAMOL 500 MG PO TABS
500.0000 mg | ORAL_TABLET | Freq: Four times a day (QID) | ORAL | Status: DC | PRN
  Administered 2022-03-06 – 2022-03-07 (×2): 500 mg via ORAL
  Filled 2022-03-06 (×2): qty 1

## 2022-03-06 MED ORDER — OXYCODONE HCL 5 MG PO TABS: 5.0000 mg | ORAL_TABLET | Freq: Once | ORAL | Status: DC | PRN

## 2022-03-06 MED ORDER — POTASSIUM CHLORIDE IN NACL 20-0.9 MEQ/L-% IV SOLN
INTRAVENOUS | Status: DC
  Filled 2022-03-06 (×2): qty 1000

## 2022-03-06 MED ORDER — ROCURONIUM BROMIDE 10 MG/ML (PF) SYRINGE
PREFILLED_SYRINGE | INTRAVENOUS | Status: AC
  Filled 2022-03-06: qty 10

## 2022-03-06 MED ORDER — MIDAZOLAM HCL 2 MG/2ML IJ SOLN
INTRAMUSCULAR | Status: AC
  Filled 2022-03-06: qty 2

## 2022-03-06 MED ORDER — DEXAMETHASONE SODIUM PHOSPHATE 10 MG/ML IJ SOLN
INTRAMUSCULAR | Status: DC | PRN
  Administered 2022-03-06: 4 mg via INTRAVENOUS

## 2022-03-06 MED ORDER — POVIDONE-IODINE 10 % EX SWAB
2.0000 | Freq: Once | CUTANEOUS | Status: AC
  Administered 2022-03-06: 2 via TOPICAL

## 2022-03-06 MED ORDER — BUPIVACAINE HCL (PF) 0.25 % IJ SOLN
INTRAMUSCULAR | Status: AC
  Filled 2022-03-06: qty 30

## 2022-03-06 MED ORDER — FENTANYL CITRATE PF 50 MCG/ML IJ SOSY
50.0000 ug | PREFILLED_SYRINGE | Freq: Once | INTRAMUSCULAR | Status: AC
  Administered 2022-03-06: 50 ug via INTRAVENOUS
  Filled 2022-03-06: qty 2

## 2022-03-06 MED ORDER — CEFAZOLIN SODIUM-DEXTROSE 2-4 GM/100ML-% IV SOLN
2.0000 g | INTRAVENOUS | Status: AC
  Administered 2022-03-06: 2 g via INTRAVENOUS
  Filled 2022-03-06: qty 100

## 2022-03-06 MED ORDER — ROSUVASTATIN CALCIUM 10 MG PO TABS
10.0000 mg | ORAL_TABLET | Freq: Every day | ORAL | Status: DC
  Administered 2022-03-07: 10 mg via ORAL
  Filled 2022-03-06: qty 1

## 2022-03-06 MED ORDER — 0.9 % SODIUM CHLORIDE (POUR BTL) OPTIME
TOPICAL | Status: DC | PRN
  Administered 2022-03-06: 1000 mL

## 2022-03-06 MED ORDER — TAMSULOSIN HCL 0.4 MG PO CAPS
0.8000 mg | ORAL_CAPSULE | Freq: Every evening | ORAL | Status: DC
  Administered 2022-03-06: 0.8 mg via ORAL
  Filled 2022-03-06: qty 2

## 2022-03-06 MED ORDER — CHLORHEXIDINE GLUCONATE 0.12 % MT SOLN
15.0000 mL | Freq: Once | OROMUCOSAL | Status: AC
  Administered 2022-03-06: 15 mL via OROMUCOSAL

## 2022-03-06 MED ORDER — MAGNESIUM CITRATE PO SOLN: 1.0000 | Freq: Once | ORAL | Status: DC | PRN

## 2022-03-06 MED ORDER — PHENYLEPHRINE 80 MCG/ML (10ML) SYRINGE FOR IV PUSH (FOR BLOOD PRESSURE SUPPORT)
PREFILLED_SYRINGE | INTRAVENOUS | Status: AC
  Filled 2022-03-06: qty 10

## 2022-03-06 MED ORDER — PROPOFOL 500 MG/50ML IV EMUL
INTRAVENOUS | Status: DC | PRN
  Administered 2022-03-06: 100 ug/kg/min via INTRAVENOUS

## 2022-03-06 MED ORDER — TRANEXAMIC ACID-NACL 1000-0.7 MG/100ML-% IV SOLN
1000.0000 mg | INTRAVENOUS | Status: AC
  Administered 2022-03-06: 1000 mg via INTRAVENOUS
  Filled 2022-03-06: qty 100

## 2022-03-06 MED ORDER — ACETAMINOPHEN 500 MG PO TABS
1000.0000 mg | ORAL_TABLET | Freq: Four times a day (QID) | ORAL | Status: DC
  Administered 2022-03-06 – 2022-03-07 (×3): 1000 mg via ORAL
  Filled 2022-03-06 (×3): qty 2

## 2022-03-06 MED ORDER — LIDOCAINE HCL (PF) 2 % IJ SOLN
INTRAMUSCULAR | Status: AC
  Filled 2022-03-06: qty 5

## 2022-03-06 MED ORDER — ALLOPURINOL 300 MG PO TABS
300.0000 mg | ORAL_TABLET | Freq: Every day | ORAL | Status: DC
  Administered 2022-03-07: 300 mg via ORAL
  Filled 2022-03-06: qty 1

## 2022-03-06 MED ORDER — STERILE WATER FOR IRRIGATION IR SOLN
Status: DC | PRN
  Administered 2022-03-06: 2000 mL

## 2022-03-06 MED ORDER — AMISULPRIDE (ANTIEMETIC) 5 MG/2ML IV SOLN: 10.0000 mg | Freq: Once | INTRAVENOUS | Status: DC | PRN

## 2022-03-06 MED ORDER — VASOPRESSIN 20 UNIT/ML IV SOLN
INTRAVENOUS | Status: AC
  Filled 2022-03-06: qty 1

## 2022-03-06 MED ORDER — FENTANYL CITRATE (PF) 100 MCG/2ML IJ SOLN
INTRAMUSCULAR | Status: DC | PRN
  Administered 2022-03-06: 100 ug via INTRAVENOUS

## 2022-03-06 MED ORDER — SUGAMMADEX SODIUM 500 MG/5ML IV SOLN
INTRAVENOUS | Status: DC | PRN
  Administered 2022-03-06: 200 mg via INTRAVENOUS

## 2022-03-06 MED ORDER — DIPHENHYDRAMINE HCL 12.5 MG/5ML PO ELIX: 12.5000 mg | ORAL_SOLUTION | ORAL | Status: DC | PRN

## 2022-03-06 MED ORDER — LACTATED RINGERS IV SOLN: INTRAVENOUS | Status: DC

## 2022-03-06 MED ORDER — ONDANSETRON HCL 4 MG PO TABS
4.0000 mg | ORAL_TABLET | Freq: Four times a day (QID) | ORAL | Status: DC | PRN
  Filled 2022-03-06: qty 1

## 2022-03-06 MED ORDER — DEXAMETHASONE SODIUM PHOSPHATE 10 MG/ML IJ SOLN
INTRAMUSCULAR | Status: AC
  Filled 2022-03-06: qty 1

## 2022-03-06 MED ORDER — HYDROMORPHONE HCL 1 MG/ML IJ SOLN: 0.5000 mg | INTRAMUSCULAR | Status: DC | PRN

## 2022-03-06 MED ORDER — METOCLOPRAMIDE HCL 5 MG/ML IJ SOLN: 5.0000 mg | Freq: Three times a day (TID) | INTRAMUSCULAR | Status: DC | PRN

## 2022-03-06 MED ORDER — BISACODYL 10 MG RE SUPP: 10.0000 mg | Freq: Every day | RECTAL | Status: DC | PRN

## 2022-03-06 MED ORDER — BUPROPION HCL ER (XL) 300 MG PO TB24
300.0000 mg | ORAL_TABLET | Freq: Every day | ORAL | Status: DC
  Administered 2022-03-07: 300 mg via ORAL
  Filled 2022-03-06: qty 1

## 2022-03-06 MED ORDER — PHENYLEPHRINE HCL (PRESSORS) 10 MG/ML IV SOLN
INTRAVENOUS | Status: DC | PRN
  Administered 2022-03-06: 160 ug via INTRAVENOUS

## 2022-03-06 MED ORDER — ORAL CARE MOUTH RINSE: 15.0000 mL | Freq: Once | OROMUCOSAL | Status: AC

## 2022-03-06 MED ORDER — SODIUM CHLORIDE (PF) 0.9 % IJ SOLN
INTRAMUSCULAR | Status: AC
  Filled 2022-03-06: qty 10

## 2022-03-06 MED ORDER — ONDANSETRON HCL 4 MG/2ML IJ SOLN: 4.0000 mg | Freq: Four times a day (QID) | INTRAMUSCULAR | Status: DC | PRN

## 2022-03-06 MED ORDER — BUPIVACAINE HCL (PF) 0.5 % IJ SOLN
INTRAMUSCULAR | Status: DC | PRN
  Administered 2022-03-06: 15 mL via PERINEURAL

## 2022-03-06 MED ORDER — GLYCOPYRROLATE 0.2 MG/ML IJ SOLN
INTRAMUSCULAR | Status: DC | PRN
  Administered 2022-03-06: .2 mg via INTRAVENOUS

## 2022-03-06 MED ORDER — PHENOL 1.4 % MT LIQD: 1.0000 | OROMUCOSAL | Status: DC | PRN

## 2022-03-06 MED ORDER — EPHEDRINE SULFATE (PRESSORS) 50 MG/ML IJ SOLN
INTRAMUSCULAR | Status: DC | PRN
  Administered 2022-03-06: 10 mg via INTRAVENOUS
  Administered 2022-03-06: 5 mg via INTRAVENOUS
  Administered 2022-03-06: 10 mg via INTRAVENOUS

## 2022-03-06 MED ORDER — OXYCODONE HCL 5 MG PO TABS
5.0000 mg | ORAL_TABLET | ORAL | Status: DC | PRN
  Administered 2022-03-06: 5 mg via ORAL
  Administered 2022-03-06 – 2022-03-07 (×2): 10 mg via ORAL
  Filled 2022-03-06 (×2): qty 2
  Filled 2022-03-06: qty 1

## 2022-03-06 MED ORDER — METHOCARBAMOL 500 MG IVPB - SIMPLE MED: 500.0000 mg | Freq: Four times a day (QID) | INTRAVENOUS | Status: DC | PRN

## 2022-03-06 MED ORDER — DOCUSATE SODIUM 100 MG PO CAPS
100.0000 mg | ORAL_CAPSULE | Freq: Two times a day (BID) | ORAL | Status: DC
  Administered 2022-03-06 – 2022-03-07 (×2): 100 mg via ORAL
  Filled 2022-03-06 (×2): qty 1

## 2022-03-06 SURGICAL SUPPLY — 73 items
AID PSTN UNV HD RSTRNT DISP (MISCELLANEOUS) ×1
AUG COMP REV MI TAPER ADAPTER (Joint) ×1 IMPLANT
AUGMENT COMP REV MI TAPR ADPTR (Joint) IMPLANT
BAG COUNTER SPONGE SURGICOUNT (BAG) IMPLANT
BAG SPEC THK2 15X12 ZIP CLS (MISCELLANEOUS) ×1
BAG SPNG CNTER NS LX DISP (BAG)
BAG ZIPLOCK 12X15 (MISCELLANEOUS) ×1 IMPLANT
BIT DRILL 2.7 W/STOP DISP (BIT) IMPLANT
BIT DRILL TWIST 2.7 (BIT) IMPLANT
BLADE SAW SGTL 73X25 THK (BLADE) ×1 IMPLANT
BRNG HUM +3 36 RVRS SHLDR (Shoulder) ×1 IMPLANT
BSPLAT GLND SM AUG TPR ADPR (Joint) ×1 IMPLANT
CLSR STERI-STRIP ANTIMIC 1/2X4 (GAUZE/BANDAGES/DRESSINGS) ×1 IMPLANT
COOLER ICEMAN CLASSIC (MISCELLANEOUS) ×1 IMPLANT
COVER BACK TABLE 60X90IN (DRAPES) ×1 IMPLANT
COVER MAYO STAND STRL (DRAPES) ×1 IMPLANT
COVER SURGICAL LIGHT HANDLE (MISCELLANEOUS) ×1 IMPLANT
DRAPE POUCH INSTRU U-SHP 10X18 (DRAPES) ×1 IMPLANT
DRAPE SHEET LG 3/4 BI-LAMINATE (DRAPES) ×2 IMPLANT
DRAPE SURG 17X11 SM STRL (DRAPES) ×1 IMPLANT
DRAPE U-SHAPE 47X51 STRL (DRAPES) ×1 IMPLANT
DRSG MEPILEX POST OP 4X8 (GAUZE/BANDAGES/DRESSINGS) ×1 IMPLANT
DRSG MEPITEL 4X7.2 (GAUZE/BANDAGES/DRESSINGS) IMPLANT
DURAPREP 26ML APPLICATOR (WOUND CARE) ×1 IMPLANT
ELECT REM PT RETURN 15FT ADLT (MISCELLANEOUS) ×1 IMPLANT
FACESHIELD WRAPAROUND (MASK) IMPLANT
FACESHIELD WRAPAROUND OR TEAM (MASK) IMPLANT
GLENOID SPHERE STD STRL 36MM (Orthopedic Implant) IMPLANT
GLOVE BIO SURGEON STRL SZ 6.5 (GLOVE) ×1 IMPLANT
GLOVE BIO SURGEON STRL SZ7.5 (GLOVE) ×1 IMPLANT
GLOVE BIO SURGEON STRL SZ8 (GLOVE) ×1 IMPLANT
GLOVE BIOGEL PI IND STRL 7.0 (GLOVE) ×1 IMPLANT
GLOVE BIOGEL PI IND STRL 8 (GLOVE) ×2 IMPLANT
GOWN STRL SURGICAL XL XLNG (GOWN DISPOSABLE) ×2 IMPLANT
HANDPIECE INTERPULSE COAX TIP (DISPOSABLE) ×1
HOOD PEEL AWAY FLYTE STAYCOOL (MISCELLANEOUS) ×1 IMPLANT
KIT BASIN OR (CUSTOM PROCEDURE TRAY) ×1 IMPLANT
KIT TURNOVER KIT A (KITS) IMPLANT
NS IRRIG 1000ML POUR BTL (IV SOLUTION) ×1 IMPLANT
PACK SHOULDER (CUSTOM PROCEDURE TRAY) ×1 IMPLANT
PAD COLD SHLDR WRAP-ON (PAD) ×1 IMPLANT
PIN STEINMANN THREADED TIP (PIN) IMPLANT
PIN THREADED REVERSE (PIN) IMPLANT
PROTECTOR NERVE ULNAR (MISCELLANEOUS) ×1 IMPLANT
REAMER GUIDE BUSHING SURG DISP (MISCELLANEOUS) IMPLANT
REAMER GUIDE W/SCREW AUG (MISCELLANEOUS) IMPLANT
RESTRAINT HEAD UNIVERSAL NS (MISCELLANEOUS) ×1 IMPLANT
SCREW CENTRAL 6.5X20MM (Screw) IMPLANT
SCREW LOCKING 4.75MMX15MM (Screw) IMPLANT
SCREW LOCKING NS 4.75MMX20MM (Screw) IMPLANT
SCREW LOCKING STRL 4.75X25X3.5 (Screw) IMPLANT
SET HNDPC FAN SPRY TIP SCT (DISPOSABLE) ×1 IMPLANT
SLING ARM FOAM STRAP LRG (SOFTGOODS) IMPLANT
SLING ARM IMMOBILIZER LRG (SOFTGOODS) ×1 IMPLANT
SMARTMIX MINI TOWER (MISCELLANEOUS)
SPIKE FLUID TRANSFER (MISCELLANEOUS) IMPLANT
SPONGE T-LAP 4X18 ~~LOC~~+RFID (SPONGE) ×1 IMPLANT
STEM HUMERAL STRL 12MMX14MM (Stem) IMPLANT
SUCTION FRAZIER HANDLE 12FR (TUBING) ×1
SUCTION TUBE FRAZIER 12FR DISP (TUBING) ×1 IMPLANT
SUPPORT WRAP ARM LG (MISCELLANEOUS) ×1 IMPLANT
SUT MAXBRAID #2 CVD NDL (SUTURE) ×1 IMPLANT
SUT MAXBRAID #5 CCS-NDL 2PK (SUTURE) ×2 IMPLANT
SUT VIC AB 1 CT1 36 (SUTURE) ×1 IMPLANT
SUT VIC AB 2-0 CT1 27 (SUTURE) ×1
SUT VIC AB 2-0 CT1 TAPERPNT 27 (SUTURE) ×1 IMPLANT
SUT VIC AB 3-0 SH 8-18 (SUTURE) ×1 IMPLANT
TOWEL OR 17X26 10 PK STRL BLUE (TOWEL DISPOSABLE) ×1 IMPLANT
TOWEL OR NON WOVEN STRL DISP B (DISPOSABLE) ×1 IMPLANT
TOWER SMARTMIX MINI (MISCELLANEOUS) IMPLANT
TRAY HUM REV SHOULDER 36 +3 (Shoulder) IMPLANT
TRAY HUM REV SHOULDER STD +6 (Shoulder) IMPLANT
WATER STERILE IRR 1000ML POUR (IV SOLUTION) ×2 IMPLANT

## 2022-03-06 NOTE — Interval H&P Note (Signed)
History and Physical Interval Note:  03/06/2022 11:13 AM  Theodore Patton  has presented today for surgery, with the diagnosis of djd left shoulder.  The various methods of treatment have been discussed with the patient and family. After consideration of risks, benefits and other options for treatment, the patient has consented to  Procedure(s): TOTAL SHOULDER ARTHROPLASTY (Left) as a surgical intervention.  The patient's history has been reviewed, patient examined, no change in status, stable for surgery.  I have reviewed the patient's chart and labs.  Questions were answered to the patient's satisfaction.     Theodore Patton

## 2022-03-06 NOTE — Transfer of Care (Signed)
Immediate Anesthesia Transfer of Care Note  Patient: Theodore Patton  Procedure(s) Performed: TOTAL SHOULDER ARTHROPLASTY (Left: Shoulder)  Patient Location: PACU  Anesthesia Type:General  Level of Consciousness: awake and drowsy  Airway & Oxygen Therapy: Patient Spontanous Breathing and Patient connected to nasal cannula oxygen  Post-op Assessment: Report given to RN, Post -op Vital signs reviewed and stable and Patient moving all extremities X 4  Post vital signs: Reviewed and stable  Last Vitals:  Vitals Value Taken Time  BP 107/67 03/06/22 1430  Temp    Pulse 69 03/06/22 1437  Resp 15 03/06/22 1437  SpO2 99 % 03/06/22 1437  Vitals shown include unvalidated device data.  Last Pain:  Vitals:   03/06/22 1045  TempSrc:   PainSc: 0-No pain         Complications: No notable events documented.

## 2022-03-06 NOTE — Anesthesia Postprocedure Evaluation (Signed)
Anesthesia Post Note  Patient: JARRETT ALBOR  Procedure(s) Performed: TOTAL SHOULDER ARTHROPLASTY (Left: Shoulder)     Patient location during evaluation: PACU Anesthesia Type: General Level of consciousness: awake and alert Pain management: pain level controlled Vital Signs Assessment: post-procedure vital signs reviewed and stable Respiratory status: spontaneous breathing, nonlabored ventilation, respiratory function stable and patient connected to nasal cannula oxygen Cardiovascular status: stable and blood pressure returned to baseline Anesthetic complications: no   No notable events documented.  Last Vitals:  Vitals:   03/06/22 1120 03/06/22 1430  BP: (!) 160/71 107/67  Pulse: 61 76  Resp: 16 11  Temp:    SpO2: 98% 98%    Last Pain:  Vitals:   03/06/22 1500  TempSrc:   PainSc: 0-No pain                 [name]

## 2022-03-06 NOTE — Op Note (Signed)
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  ANESTHESIA:   General with interscalene block using Exparel  ESTIMATED BLOOD LOSS: 150 mL  UNIQUE ASPECTS OF THE CASE: I used an augment superiorly, and I cut the head twice to have adequate access.  PREOPERATIVE INDICATIONS:  Theodore Patton is a  76 y.o. male with a diagnosis of djd left shoulder who failed conservative measures and elected for surgical management.    The risks benefits and alternatives were discussed with the patient preoperatively including but not limited to the risks of infection, bleeding, nerve injury, cardiopulmonary complications, the need for revision surgery, dislocation, brachial plexus palsy, incomplete relief of pain, among others, and the patient was willing to proceed. Implant Name Type Inv. Item Serial No. Manufacturer Lot No. LRB No. Used Action  AUG COMP REV MI TAPER ADAPTER - [id number] Joint AUG COMP REV MI TAPER ADAPTER  [employer](ORTH,TRAU,BIO,SG) [id number] Left 1 Implanted  SCREW CENTRAL 6.5X20MM - [id number] Screw SCREW CENTRAL 6.5X20MM  [employer](ORTH,TRAU,BIO,SG) [id number] Left 1 Implanted  SCREW LOCKING NS 4.75MMX20MM - [id number] Screw SCREW LOCKING NS 4.75MMX20MM  [employer](ORTH,TRAU,BIO,SG) [id number] Left 1 Implanted  SCREW LOCKING STRL 8.18H63J4.5 - [id number] Screw SCREW LOCKING STRL 4.75X25X3.5  [employer](ORTH,TRAU,BIO,SG) [id number] Left 1 Implanted  SCREW LOCKING STRL 2.87O67E7.5 - [id number] Screw SCREW LOCKING STRL 4.75X25X3.5  [employer](ORTH,TRAU,BIO,SG) [id number] Left 1 Implanted  SCREW LOCKING 4.75MMX15MM  - [id number] Screw SCREW LOCKING 4.75MMX15MM  [employer](ORTH,TRAU,BIO,SG) [id number] Left 1 Implanted  GLENOID SPHERE STD STRL 36MM - [id number] Orthopedic Implant GLENOID SPHERE STD STRL 36MM  [employer](ORTH,TRAU,BIO,SG) [id number] Left 1 Implanted  STEM HUMERAL STRL 12MMX14MM - [id number] Stem STEM HUMERAL STRL 12MMX14MM  [employer](ORTH,TRAU,BIO,SG) [id number] Left 1 Implanted  TRAY HUM REV SHOULDER 36 +3 - [id number] Shoulder TRAY HUM REV SHOULDER 36 +3  [employer](ORTH,TRAU,BIO,SG) [id number] Left 1 Implanted  TRAY HUM REV SHOULDER STD +6 - [id number] Shoulder TRAY HUM REV SHOULDER STD +6  [employer](ORTH,TRAU,BIO,SG) [id number] Left 1 Implanted     OPERATIVE FINDINGS: He had advanced glenohumeral osteoarthritis with fairly symmetric bone loss of the glenoid centrally.  I used an augment placed superiorly in order to minimize the amount of bone removal.  I did have to ream off the bottom of the glenoid in order to minimize impingement.  I did have to cut the head twice in order to have adequate access to the glenoid.  OPERATIVE PROCEDURE: The patient was brought to the operating room and placed in the supine position. General anesthesia was administered. IV antibiotics were given.  Time out was performed. The upper extremity was prepped and draped in usual sterile fashion. The patient was in a beachchair position. Deltopectoral approach was carried out. The biceps was tenodesed to the pectoralis tendon with #2 max braid. The subscapularis was released off of the bone.   I then performed circumferential releases of the humerus, and then dislocated the head, and then reamed with the reamer to the above named size.  I then applied the jig, and cut the humeral head in 30 of retroversion, and then turned my attention to the glenoid.  Deep retractors were placed, and  I resected the labrum, and then placed a guidepin into the center position on the glenoid, with slight inferior inclination. I then  reamed over the guidepin, and this created a small metaphyseal cancellus blush inferiorly, removing just the cartilage to the subchondral bone superiorly.  I did this to the 50% level, and then applied the guides for the posterior superior augment, I measured this between a medium and a small, ultimately the small look like it fit better and mineralized bone removal.  I reamed after placing the central screw, prepared the superior aspect, and then placed the real augmented baseplate in place.    I secured it centrally with a nonlocking screw, and I had excellent purchase both inferiorly and superiorly. I placed a short locking screws on anterior and posterior aspects.  I then turned my attention to the glenosphere, and impacted this into place, placing slight inferior offset (set on B).  I did have to ream off the bottom using a calcar planer in order to minimize bony impingement.  The glenosphere was completely seated, and had engagement of the [facility] taper. I then turned my attention back to the humerus.  I sequentially broached, and then trialed, and was found to restore soft tissue tension, and it had 2 finger tightness. Therefore the above named components were selected. The shoulder felt stable throughout functional motion.  The +3 with 6 mm of offset seem to restore anatomy the best.  Before I placed the real prosthesis I had also placed a total of 1 #2 max braid in 2 #5 max braid through drill holes in the humerus for later subscapularis repair.  I then impacted the real prosthesis into place, as well as the real humeral tray, and reduced the shoulder. The shoulder had excellent motion, and was stable, and I irrigated the wounds copiously.    I then used these to repair the subscapularis. This came down to bone.  I also repaired the rotator interval with #2 max braid.  I then irrigated the shoulder copiously once more, repaired the deltopectoral interval with Vicryl followed by subcutaneous  Vicryl with Steri-Strips and sterile gauze for the skin. The patient was awakened and returned back in stable and satisfactory condition. There were no complications and He tolerated the procedure well.

## 2022-03-06 NOTE — Plan of Care (Signed)
  Problem: Education: Goal: Knowledge of the prescribed therapeutic regimen will improve Outcome: Progressing Goal: Understanding of activity limitations/precautions following surgery will improve Outcome: Progressing Goal: Individualized Educational Video(s) Outcome: Progressing   Problem: Activity: Goal: Ability to tolerate increased activity will improve Outcome: Progressing   Problem: Pain Management: Goal: Pain level will decrease with appropriate interventions Outcome: Progressing   Problem: Education: Goal: Knowledge of the prescribed therapeutic regimen will improve Outcome: Progressing Goal: Understanding of activity limitations/precautions following surgery will improve Outcome: Progressing Goal: Individualized Educational Video(s) Outcome: Progressing   Problem: Activity: Goal: Ability to tolerate increased activity will improve Outcome: Progressing   Problem: Pain Management: Goal: Pain level will decrease with appropriate interventions Outcome: Progressing   Problem: Education: Goal: Knowledge of General Education information will improve Description: Including pain rating scale, medication(s)/side effects and non-pharmacologic comfort measures Outcome: Progressing   Problem: Health Behavior/Discharge Planning: Goal: Ability to manage health-related needs will improve Outcome: Progressing   Problem: Clinical Measurements: Goal: Ability to maintain clinical measurements within normal limits will improve Outcome: Progressing Goal: Will remain free from infection Outcome: Progressing Goal: Diagnostic test results will improve Outcome: Progressing Goal: Respiratory complications will improve Outcome: Progressing Goal: Cardiovascular complication will be avoided Outcome: Progressing   Problem: Activity: Goal: Risk for activity intolerance will decrease Outcome: Progressing   Problem: Nutrition: Goal: Adequate nutrition will be maintained Outcome:  Progressing   Problem: Coping: Goal: Level of anxiety will decrease Outcome: Progressing   Problem: Elimination: Goal: Will not experience complications related to bowel motility Outcome: Progressing Goal: Will not experience complications related to urinary retention Outcome: Progressing   Problem: Pain Managment: Goal: General experience of comfort will improve Outcome: Progressing   Problem: Safety: Goal: Ability to remain free from injury will improve Outcome: Progressing   Problem: Skin Integrity: Goal: Risk for impaired skin integrity will decrease Outcome: Progressing

## 2022-03-06 NOTE — Anesthesia Procedure Notes (Signed)
Anesthesia Regional Block: Interscalene brachial plexus block   Pre-Anesthetic Checklist: , timeout performed,  Correct Patient, Correct Site, Correct Laterality,  Correct Procedure, Correct Position, site marked,  Risks and benefits discussed,  Surgical consent,  Pre-op evaluation,  At surgeon's request and post-op pain management  Laterality: Left  Prep: chloraprep       Needles:  Injection technique: Single-shot  Needle Type: Echogenic Needle     Needle Length: 5cm  Needle Gauge: 21     Additional Needles:   Narrative:  Start time: 03/06/2022 10:32 AM End time: 03/06/2022 10:35 AM Injection made incrementally with aspirations every 5 mL.  Performed by: Personally  Anesthesiologist: [name], MD  Additional Notes: No pain on injection. No increased resistance to injection. Injection made in 5cc increments. Good needle visualization. Patient tolerated the procedure well.

## 2022-03-06 NOTE — Anesthesia Procedure Notes (Signed)
Procedure Name: Intubation Date/Time: 03/06/2022 11:55 AM  Performed by: Jonna Munro, CRNAPre-anesthesia Checklist: Patient identified, Emergency Drugs available, Suction available, Patient being monitored and Timeout performed Patient Re-evaluated:Patient Re-evaluated prior to induction Oxygen Delivery Method: Circle system utilized Preoxygenation: Pre-oxygenation with 100% oxygen Induction Type: IV induction Laryngoscope Size: Mac and 3 Grade View: Grade I Tube type: Oral Tube size: 7.5 mm Number of attempts: 1 Airway Equipment and Method: Stylet Placement Confirmation: ETT inserted through vocal cords under direct vision, positive ETCO2, breath sounds checked- equal and bilateral and CO2 detector Secured at: 23 cm Tube secured with: Tape Dental Injury: Teeth and Oropharynx as per pre-operative assessment

## 2022-03-06 NOTE — Plan of Care (Signed)
  Problem: Education: Goal: Knowledge of the prescribed therapeutic regimen will improve Outcome: Progressing   Problem: Activity: Goal: Ability to tolerate increased activity will improve Outcome: Progressing   Problem: Pain Management: Goal: Pain level will decrease with appropriate interventions Outcome: Progressing   Problem: Education: Goal: Knowledge of General Education information will improve Description: Including pain rating scale, medication(s)/side effects and non-pharmacologic comfort measures Outcome: Progressing   Problem: Clinical Measurements: Goal: Ability to maintain clinical measurements within normal limits will improve Outcome: Progressing   Problem: Education: Goal: Knowledge of the prescribed therapeutic regimen will improve Outcome: Progressing   Problem: Activity: Goal: Ability to tolerate increased activity will improve Outcome: Progressing   Problem: Pain Management: Goal: Pain level will decrease with appropriate interventions Outcome: Progressing

## 2022-03-07 ENCOUNTER — Encounter (HOSPITAL_COMMUNITY): Payer: Self-pay | Admitting: Orthopedic Surgery

## 2022-03-07 DIAGNOSIS — M19012 Primary osteoarthritis, left shoulder: Secondary | ICD-10-CM | POA: Diagnosis not present

## 2022-03-07 LAB — BASIC METABOLIC PANEL
Anion gap: 6 (ref 5–15)
BUN: 14 mg/dL (ref 8–23)
CO2: 26 mmol/L (ref 22–32)
Calcium: 8.6 mg/dL — ABNORMAL LOW (ref 8.9–10.3)
Chloride: 106 mmol/L (ref 98–111)
Creatinine, Ser: 0.98 mg/dL (ref 0.61–1.24)
GFR, Estimated: >60 mL/min (ref >60)
Glucose, Bld: 130 mg/dL — ABNORMAL HIGH (ref 70–99)
Potassium: 5.3 mmol/L — ABNORMAL HIGH (ref 3.5–5.1)
Sodium: 138 mmol/L (ref 135–145)

## 2022-03-07 LAB — CBC
HCT: 32 % — ABNORMAL LOW (ref 39.0–52.0)
Hemoglobin: 11.1 g/dL — ABNORMAL LOW (ref 13.0–17.0)
MCH: 34.3 pg — ABNORMAL HIGH (ref 26.0–34.0)
MCHC: 34.7 g/dL (ref 30.0–36.0)
MCV: 98.8 fL (ref 80.0–100.0)
Platelets: 107 10*3/uL — ABNORMAL LOW (ref 150–400)
RBC: 3.24 MIL/uL — ABNORMAL LOW (ref 4.22–5.81)
RDW: 12.7 % (ref 11.5–15.5)
WBC: 9.1 10*3/uL (ref 4.0–10.5)
nRBC: 0 % (ref 0.0–0.2)

## 2022-03-07 MED ORDER — SENNA-DOCUSATE SODIUM 8.6-50 MG PO TABS: 2.0000 | ORAL_TABLET | Freq: Every day | ORAL | 1 refills | Status: DC

## 2022-03-07 MED ORDER — BACLOFEN 10 MG PO TABS: 10.0000 mg | ORAL_TABLET | Freq: Three times a day (TID) | ORAL | 0 refills | Status: DC

## 2022-03-07 MED ORDER — OXYCODONE HCL 5 MG PO TABS: 5.0000 mg | ORAL_TABLET | ORAL | 0 refills | Status: DC | PRN

## 2022-03-07 MED ORDER — ONDANSETRON HCL 4 MG PO TABS: 4.0000 mg | ORAL_TABLET | Freq: Three times a day (TID) | ORAL | 0 refills | Status: DC | PRN

## 2022-03-07 NOTE — Progress Notes (Signed)
     Subjective: 1 Day Post-Op s/p Procedure(s): TOTAL SHOULDER ARTHROPLASTY   Patient is alert, oriented. Patient reports pain as well controlled so far. Voiding well. Denies chest pain, SOB, Calf pain. No nausea/vomiting. No other complaints.  Objective:  PE: VITALS:   Vitals:   03/06/22 1637 03/06/22 2009 03/07/22 0049 03/07/22 0620  BP: 136/69 (!) 155/74 131/68 (!) 141/74  Pulse: 72 77 80 79  Resp: '18 17 17 17  '$ Temp: 98.5 F (36.9 C) 97.9 F (36.6 C) 97.7 F (36.5 C) 98 F (36.7 C)  TempSrc: Oral  Oral Oral  SpO2: 93% 94% 92% 94%  Weight:      Height:       General: sitting up in bed, in no acute distress MSK: LUE in sling, dressing CDI. Full motion at right wrist and fingers of right hand. Distal sensation intact, sensation has not yet returned at shoulder. Fingers warm and well perfused.    LABS  Results for orders placed or performed during the hospital encounter of 03/06/22 (from the past 24 hour(s))  CBC     Status: Abnormal   Collection Time: 03/07/22  3:43 AM  Result Value Ref Range   WBC 9.1 4.0 - 10.5 K/uL   RBC 3.24 (L) 4.22 - 5.81 MIL/uL   Hemoglobin 11.1 (L) 13.0 - 17.0 g/dL   HCT 32.0 (L) 39.0 - 52.0 %   MCV 98.8 80.0 - 100.0 fL   MCH 34.3 (H) 26.0 - 34.0 pg   MCHC 34.7 30.0 - 36.0 g/dL   RDW 12.7 11.5 - 15.5 %   Platelets 107 (L) 150 - 400 K/uL   nRBC 0.0 0.0 - 0.2 %  Basic metabolic panel     Status: Abnormal   Collection Time: 03/07/22  3:43 AM  Result Value Ref Range   Sodium 138 135 - 145 mmol/L   Potassium 5.3 (H) 3.5 - 5.1 mmol/L   Chloride 106 98 - 111 mmol/L   CO2 26 22 - 32 mmol/L   Glucose, Bld 130 (H) 70 - 99 mg/dL   BUN 14 8 - 23 mg/dL   Creatinine, Ser 0.98 0.61 - 1.24 mg/dL   Calcium 8.6 (L) 8.9 - 10.3 mg/dL   GFR, Estimated >60 >60 mL/min   Anion gap 6 5 - 15    DG Shoulder Left Port  Result Date: 03/06/2022 CLINICAL DATA:  Status post reverse left shoulder arthroplasty. EXAM: LEFT SHOULDER COMPARISON:  None Available.  FINDINGS: The left glenoid and humeral components are well situated. Expected postoperative changes are seen surrounding soft tissues. IMPRESSION: Status post reverse left shoulder arthroplasty. Electronically Signed   By: Marijo Conception M.D.   On: 03/06/2022 16:28    Assessment/Plan: Principal Problem:   S/P reverse total shoulder arthroplasty, left  1 Day Post-Op s/p Procedure(s): TOTAL SHOULDER ARTHROPLASTY  Weightbearing: NWB LUE Insicional and dressing care: Reinforce dressings as needed Orthopedic device(s): sling Pain control: continue current regimen Follow - up plan: 2 weeks with Dr. Mardelle Matte Dispo: plan to discharge home this morning, after working with OT Contact information:   Merlene Pulling, PA-C Weekdays 8-5  After hours and holidays please check [email or web address] for group call information for [facility]  Ventura Bruns 03/07/2022, 8:13 AM

## 2022-03-07 NOTE — Evaluation (Signed)
Occupational Therapy Evaluation Patient Details Name: Theodore Patton MRN: [id number] DOB: 1946/05/17 Today's Date: 03/07/2022   History of Present Illness Patient is s/p L reverse total shoulder arthroplasty   Clinical Impression    Mr. [name] Duckett is a 76 y/o male with above medical history. Patient is s/p shoulder replacement without functional use of  left upper extremity secondary to effects of surgery and interscalene block and shoulder precautions. Therapist provided education and instruction to patient and spouse in regards to exercises, precautions, positioning, donning upper extremity clothing and bathing while maintaining shoulder precautions, ice and edema management and donning/doffing sling. Patient and spouse verbalized understanding and demonstrated as needed. Patient needed assistance to donn shirt, underwear, and pants. Patient min guard for ambulation in room, after stating feeling dizzy when standing up earlier this morning, recommend a cane when at home. Patient heavily reliant on caregiver for ADLs, but has ability to perform at higher assist levels. Patient and spouse were provided with instruction on compensatory strategies to perform ADLs. Patient to follow up with MD for further therapy needs.     Recommendations for follow up therapy are one component of a multi-disciplinary discharge planning process, led by the attending physician.  Recommendations may be updated based on patient status, additional functional criteria and insurance authorization.   Follow Up Recommendations  No OT follow up    Assistance Recommended at Discharge Set up Supervision/Assistance  Patient can return home with the following A lot of help with bathing/dressing/bathroom;Assist for transportation;Assistance with cooking/housework;Help with stairs or ramp for entrance    Functional Status Assessment  Patient has had a recent decline in their functional status and demonstrates the ability to make  significant improvements in function in a reasonable and predictable amount of time.  Equipment Recommendations  None recommended by OT    Recommendations for Other Services       Precautions / Restrictions Precautions Precautions: Shoulder Type of Shoulder Precautions: No active or passive ROM with operative shoulder. Ok for active ROM through elbow, wrist, and fingers of operative arm. Shoulder Interventions: Shoulder sling/immobilizer Precaution Booklet Issued: Yes (comment) Restrictions Weight Bearing Restrictions: Yes LUE Weight Bearing: Non weight bearing      Mobility Bed Mobility Overal bed mobility: Needs Assistance Bed Mobility: Supine to Sit     Supine to sit: Min guard          Transfers Overall transfer level: Needs assistance   Transfers: Sit to/from Stand Sit to Stand: Min assist           General transfer comment: used hand rail with unaffected arm      Balance Overall balance assessment: Mild deficits observed, not formally tested (patient stated feeling unbalance in the past)                                         ADL either performed or assessed with clinical judgement   ADL Overall ADL's : Needs assistance/impaired Eating/Feeding: Set up;Sitting   Grooming: Set up;Standing   Upper Body Bathing: Sitting;Maximal assistance   Lower Body Bathing: Sit to/from stand;Maximal assistance   Upper Body Dressing : Total assistance;Sitting   Lower Body Dressing: Sit to/from stand;Maximal assistance   Toilet Transfer: Economist and Hygiene: Min guard;Sit to/from stand       Functional mobility during ADLs: Min guard General ADL Comments: Min guard  for ambulation in the room     Vision Baseline Vision/History: 1 Wears glasses       Perception     Praxis      Pertinent Vitals/Pain Pain Assessment Pain Assessment: Faces Faces Pain Scale: No hurt     Hand  Dominance Right   Extremity/Trunk Assessment Upper Extremity Assessment Upper Extremity Assessment: RUE deficits/detail;LUE deficits/detail RUE Deficits / Details: WFL-ROM RUE Sensation: WNL RUE Coordination: WNL LUE Deficits / Details: sensation and motor control impaired secondary to block       Cervical / Trunk Assessment Cervical / Trunk Assessment: Normal   Communication Communication Communication: No difficulties   Cognition Arousal/Alertness: Awake/alert Behavior During Therapy: WFL for tasks assessed/performed Overall Cognitive Status: Within Functional Limits for tasks assessed                                       General Comments       Exercises     Shoulder Instructions Shoulder Instructions Donning/doffing shirt without moving shoulder: Caregiver independent with task Method for sponge bathing under operated UE: Caregiver independent with task Donning/doffing sling/immobilizer: Caregiver independent with task Correct positioning of sling/immobilizer: Caregiver independent with task ROM for elbow, wrist and digits of operated UE: Independent Sling wearing schedule (on at all times/off for ADL's): Independent Proper positioning of operated UE when showering: Independent Positioning of UE while sleeping: [place] expects to be discharged to:: Private residence Living Arrangements: Spouse/significant other Available Help at Discharge: Family Type of Home: House Home Access: Stairs to enter Technical brewer of Steps: 4 Entrance Stairs-Rails: Right Home Layout: One level     Bathroom Shower/Tub: Occupational psychologist: Handicapped height     Home Equipment: Radio producer - single point          Prior Functioning/Environment Prior Level of Function : Independent/Modified Independent                        OT Problem List:        OT Treatment/Interventions:      OT Goals(Current  goals can be found in the care plan section) Acute Rehab OT Goals OT Goal Formulation: All assessment and education complete, DC therapy  OT Frequency:      Co-evaluation              AM-PAC OT "6 Clicks" Daily Activity     Outcome Measure Help from another person eating meals?: A Little Help from another person taking care of personal grooming?: A Little Help from another person toileting, which includes using toliet, bedpan, or urinal?: A Little Help from another person bathing (including washing, rinsing, drying)?: A Lot Help from another person to put on and taking off regular upper body clothing?: Total Help from another person to put on and taking off regular lower body clothing?: A Lot 6 Click Score: 14   End of Session Equipment Utilized During Treatment: Gait belt Nurse Communication:  (patient has been cleared by OT)  Activity Tolerance: Patient tolerated treatment well Patient left: in chair                   Time: [phone] OT Time Calculation (min): 32 min Charges:  OT General Charges $OT Visit: 1 Visit OT Evaluation $OT Eval Low Complexity: 1 Low OT Treatments $Self Care/Home Management : 8-22 mins  Charlann Lange, OTS Acute rehab services   Charlann Lange 03/07/2022, 10:08 AM

## 2022-03-07 NOTE — Progress Notes (Signed)
  Transition of Care [facility]) Screening Note   Patient Details  Name: Theodore Patton Date of Birth: [birth year]   Transition of Care [facility]) CM/SW Contact:    Lennart Pall, LCSW Phone Number: 03/07/2022, 10:12 AM    Transition of Care Department [facility]) has reviewed patient and no TOC needs have been identified at this time. We will continue to monitor patient advancement through interdisciplinary progression rounds. If new patient transition needs arise, please place a TOC consult.

## 2022-03-15 NOTE — Discharge Summary (Signed)
Discharge Summary  Patient ID: Theodore Patton MRN: [id number] DOB/AGE: March 17, 1946 76 y.o.  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 male who was admitted 03/06/2022 with a diagnosis of S/P reverse total shoulder arthroplasty, left and went to the operating room on 03/06/2022 and underwent the above named procedures.    He was given perioperative antibiotics:  Anti-infectives (From admission, onward)    Start     Dose/Rate Route Frequency Ordered Stop   03/06/22 0945  ceFAZolin (ANCEF) IVPB 2g/100 mL premix        2 g 200 mL/hr over 30 Minutes Intravenous On call to O.R. 03/06/22 [id number] 03/06/22 1152     .  He was given sequential compression devices, early ambulation for DVT prophylaxis.  He benefited maximally from the hospital stay and there were no complications.    Recent vital signs:  Vitals:   03/07/22 0049 03/07/22 0620  BP: 131/68 (!) 141/74  Pulse: 80 79  Resp: 17 17  Temp: 97.7 F (36.5 C) 98 F (36.7 C)  SpO2: 92% 94%    Recent laboratory studies:  Lab  Results  Component Value Date   HGB 11.1 (L) 03/07/2022   HGB 13.4 03/01/2022   HGB 11.0 (L) 04/15/2019   Lab Results  Component Value Date   WBC 9.1 03/07/2022   PLT 107 (L) 03/07/2022   No results found for: "INR" Lab Results  Component Value Date   NA 138 03/07/2022   K 5.3 (H) 03/07/2022   CL 106 03/07/2022   CO2 26 03/07/2022   BUN 14 03/07/2022   CREATININE 0.98 03/07/2022   GLUCOSE 130 (H) 03/07/2022    Discharge Medications:   Allergies as of 03/07/2022       Reactions   Sulfa Antibiotics Other (See Comments)   Blisters in mouth        Medication List     STOP taking these medications    diclofenac Sodium 1 % Gel Commonly known as: VOLTAREN   naproxen sodium 220 MG tablet Commonly known as: ALEVE       TAKE these medications    acetaminophen 650 MG CR tablet Commonly known as: TYLENOL Take 1,300 mg by mouth 2 (two) times daily.   Align 4 MG Caps Take 4 mg by mouth daily as needed (gut health).   allopurinol 300 MG tablet Commonly known as: ZYLOPRIM Take 300 mg by mouth daily.   baclofen 10 MG tablet Commonly known as: LIORESAL Take 1 tablet (10 mg total) by mouth 3 (three) times daily. As needed for muscle  spasm   buPROPion 150 MG 24 hr tablet Commonly known as: WELLBUTRIN XL Take 300 mg by mouth daily.   cetirizine 10 MG tablet Commonly known as: ZYRTEC Take 10 mg by mouth at bedtime.   colchicine 0.6 MG tablet Take 0.6 mg by mouth every 6 (six) hours as needed (gout flares).   COSAMIN DS PO Take 1 tablet by mouth daily.   diphenhydramine-acetaminophen 25-500 MG Tabs tablet Commonly known as: TYLENOL PM Take 2 tablets by mouth at bedtime.   famotidine 20 MG tablet Commonly known as: PEPCID Take 20 mg by mouth at bedtime.   Fish Oil 1000 MG Caps Take 1,000 mg by mouth daily.   fluticasone 50 MCG/ACT nasal spray Commonly known as: FLONASE Place 1-2 sprays into both nostrils at bedtime as needed for allergies.    gabapentin 300 MG capsule Commonly known as: NEURONTIN Take 600 mg by mouth 3 (three) times daily.   multivitamin with minerals Tabs tablet Take 1 tablet by mouth daily. Complete Multivitamin   ondansetron 4 MG tablet Commonly known as: Zofran Take 1 tablet (4 mg total) by mouth every 8 (eight) hours as needed for nausea or vomiting.   oxyCODONE 5 MG immediate release tablet Commonly known as: Roxicodone Take 1 tablet (5 mg total) by mouth every 4 (four) hours as needed for severe pain.   rosuvastatin 10 MG tablet Commonly known as: CRESTOR Take 10 mg by mouth daily.   SALINE NASAL MIST NA Place 1 spray into the nose 3 (three) times daily as needed (congestion).   sennosides-docusate sodium 8.6-50 MG tablet Commonly known as: SENOKOT-S Take 2 tablets by mouth daily.   tamsulosin 0.4 MG Caps capsule Commonly known as: FLOMAX Take 0.8 mg by mouth every evening.   TURMERIC PO Take 1,000 mg by mouth daily.        Diagnostic Studies: DG Shoulder Left Port  Result Date: 03/06/2022 CLINICAL DATA:  Status post reverse left shoulder arthroplasty. EXAM: LEFT SHOULDER COMPARISON:  None Available. FINDINGS: The left glenoid and humeral components are well situated. Expected postoperative changes are seen surrounding soft tissues. IMPRESSION: Status post reverse left shoulder arthroplasty. Electronically Signed   By: Marijo Conception M.D.   On: 03/06/2022 16:28    Disposition: Discharge disposition: 01-Home or Self Care          Follow-up Information     Marchia Bond, MD. Schedule an appointment as soon as possible for a visit in 2 week(s).   Specialty: Orthopedic Surgery Contact information: [address] [place] [address] [phone]                  Signed: Jola Baptist 03/15/2022, 9:53 AM

## 2022-03-19 ENCOUNTER — Ambulatory Visit: Payer: Medicare Other | Admitting: Physician Assistant

## 2022-03-20 ENCOUNTER — Ambulatory Visit: Payer: Medicare Other | Admitting: Physician Assistant

## 2022-03-30 ENCOUNTER — Other Ambulatory Visit (HOSPITAL_COMMUNITY): Payer: Self-pay | Admitting: Surgical

## 2022-03-30 MED ORDER — HYDROCODONE-ACETAMINOPHEN 10-325 MG PO TABS: 0.5000 | ORAL_TABLET | Freq: Two times a day (BID) | ORAL | 0 refills | Status: DC | PRN

## 2022-04-04 ENCOUNTER — Other Ambulatory Visit (HOSPITAL_COMMUNITY): Payer: Medicare Other

## 2022-07-25 ENCOUNTER — Ambulatory Visit (HOSPITAL_COMMUNITY): Payer: Medicare Other | Attending: Cardiology

## 2022-07-25 DIAGNOSIS — I359 Nonrheumatic aortic valve disorder, unspecified: Secondary | ICD-10-CM | POA: Diagnosis not present

## 2022-07-25 DIAGNOSIS — I351 Nonrheumatic aortic (valve) insufficiency: Secondary | ICD-10-CM | POA: Diagnosis not present

## 2022-07-25 LAB — ECHOCARDIOGRAM COMPLETE
Area-P 1/2: 4.71 cm2
P 1/2 time: 460 msec
S' Lateral: 3.7 cm

## 2022-08-08 ENCOUNTER — Ambulatory Visit: Payer: Medicare Other | Attending: Physician Assistant | Admitting: Physician Assistant

## 2022-08-08 ENCOUNTER — Encounter: Payer: Self-pay | Admitting: *Deleted

## 2022-08-08 ENCOUNTER — Encounter: Payer: Self-pay | Admitting: Physician Assistant

## 2022-08-08 VITALS — BP 140/68 | HR 80 | Ht 73.0 in | Wt 198.6 lb

## 2022-08-08 DIAGNOSIS — I77810 Thoracic aortic ectasia: Secondary | ICD-10-CM | POA: Diagnosis not present

## 2022-08-08 DIAGNOSIS — E7800 Pure hypercholesterolemia, unspecified: Secondary | ICD-10-CM | POA: Diagnosis not present

## 2022-08-08 DIAGNOSIS — I359 Nonrheumatic aortic valve disorder, unspecified: Secondary | ICD-10-CM

## 2022-08-08 DIAGNOSIS — I351 Nonrheumatic aortic (valve) insufficiency: Secondary | ICD-10-CM

## 2022-08-08 NOTE — H&P (View-Only) (Signed)
 Office Visit    Patient Name: Carlen G Zick Date of Encounter: 08/08/2022  PCP:  Avva, Ravisankar, MD   [facility]  Cardiologist:  Traci Turner, MD  Advanced Practice Provider:  No care team member to display Electrophysiologist:  None   HPI    Theodore Patton is a 77 y.o. male with past medical history of aortic insufficiency, dilated aortic root, HLD, coronary calcifications presents today for follow-up appointment.  He was last seen 02/12/2022 for cardiac clearance.  History includes 2D echocardiogram 01/20/2021 which showed normal LV function with EF 65%, mild LVH, grade 1 DD with trileaflet aortic valve and calcification of the right coronary cusp leading to mild coaptation and an eccentric jet of AI with moderate AI.  There was no evidence of aortic stenosis.  Echo 01/02/2022 with LVEF 60 to 65%, grade 1 DD, restricted motion of the right coronary cusp.  Aortic valve is tricuspid.  Aortic valve regurgitation is moderate to severe.  Moderate dilatation of the aortic root, measuring 43 mm.  There is mild dilatation of the ascending aorta, measuring 38 mm.  Plan for repeat echocardiogram in 6 months time.  When he was last seen in the clinic he was walking for 30 minutes a few days per week without any limitations.  Denied chest pain, shortness of breath, orthopnea, PND, syncope, lower extremity edema, or melena.  Intermittent systolic blood pressure is slightly elevated at 140-150.  Most recent echocardiogram 07/26/2022 with LVEF 60 to 65%.  Severe AI and mildly dilated aortic root at 41 mm.  Plan for follow-up for TEE to evaluate aortic insufficiency further.  Today, he endorsed fatigue, but he is still able to do what he wants to do.  No decrease in exercise tolerance.  We discussed in detail transesophageal echocardiogram and what the procedure entails.  He agreed that this is necessary.  We also discussed the possible need for surgery down the road.  He ready has  follow-up with Dr. Turner in April.  Blood pressure is pretty well-controlled today.  No issues with any of his medications.    Reports no shortness of breath nor dyspnea on exertion. Reports no chest pain, pressure, or tightness. No edema, orthopnea, PND. Reports no palpitations.    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   Past Surgical History:  Procedure Laterality Date   COLONOSCOPY     PARTIAL KNEE ARTHROPLASTY Right 04/14/2019   Procedure: UNICOMPARTMENTAL KNEE;  Surgeon: Landau, Joshua, MD;  Location: [facility];  Service: Orthopedics;  Laterality: Right;   PILONIDAL CYST EXCISION     SHOULDER ARTHROSCOPY Right 2008   TOTAL SHOULDER ARTHROPLASTY Right 07/24/2016   Procedure: TOTAL SHOULDER ARTHROPLASTY;  Surgeon: Joshua Landau, MD;  Location: [facility] OR;  Service: Orthopedics;  Laterality: Right;   TOTAL SHOULDER ARTHROPLASTY Left 03/06/2022   Procedure: TOTAL SHOULDER ARTHROPLASTY;  Surgeon: Landau,   Joshua, MD;  Location: [facility];  Service: Orthopedics;  Laterality: Left;   UPPER GI ENDOSCOPY      Allergies  Allergies  Allergen Reactions   Sulfa Antibiotics Other (See Comments)    Blisters in mouth    EKGs/Labs/Other Studies Reviewed:   The following studies were reviewed today:  Echocardiogram 07/25/2022  IMPRESSIONS     1. LVIDd 5.3 cm. Left ventricular ejection fraction, by estimation, is 60  to 65%. The left ventricle has  normal function. The left ventricle has no  regional wall motion abnormalities. There is mild left ventricular  hypertrophy. Left ventricular  diastolic parameters are consistent with Grade I diastolic dysfunction  (impaired relaxation).   2. Right ventricular systolic function is normal. The right ventricular  size is normal. Tricuspid regurgitation signal is inadequate for assessing  PA pressure.   3. The mitral valve is abnormal. Trivial mitral valve regurgitation.   4. Eccentric AI jet, posteriorly directed, restricted right coronary  cusp. The aortic valve is tricuspid. Aortic valve regurgitation is severe.  Aortic valve sclerosis/calcification is present, without any evidence of  aortic stenosis.   5. Aortic dilatation noted. There is mild dilatation of the aortic root,  measuring 41 mm.   Comparison(s): Changes from prior study are noted. 01/02/2022: LVEF 60-65%,  moderate to severe AI, dilated aortic root at 43 mm.   Conclusion(s)/Recommendation(s): Findings concerning for severe eccentric  AI, would consider Transesophageal Echocardiogram as symptoms dictate.   FINDINGS   Left Ventricle: LVIDd 5.3 cm. Left ventricular ejection fraction, by  estimation, is 60 to 65%. The left ventricle has normal function. The left  ventricle has no regional wall motion abnormalities. The left ventricular  internal cavity size was normal in  size. There is mild left ventricular hypertrophy. Left ventricular  diastolic parameters are consistent with Grade I diastolic dysfunction  (impaired relaxation). Indeterminate filling pressures.   Right Ventricle: The right ventricular size is normal. No increase in  right ventricular wall thickness. Right ventricular systolic function is  normal. Tricuspid regurgitation signal is inadequate for assessing PA  pressure.   Left Atrium: Left atrial size was normal in size.   Right Atrium: Right atrial size was normal in size.   Pericardium: There is no  evidence of pericardial effusion.   Mitral Valve: The mitral valve is abnormal. There is mild thickening of  the anterior and posterior mitral valve leaflet(s). Trivial mitral valve  regurgitation.   Tricuspid Valve: The tricuspid valve is grossly normal. Tricuspid valve  regurgitation is not demonstrated.   Aortic Valve: Eccentric AI jet, posteriorly directed, restricted right  coronary cusp. The aortic valve is tricuspid. Aortic valve regurgitation  is severe. Aortic regurgitation PHT measures 460 msec. Aortic valve  sclerosis/calcification is present,  without any evidence of aortic stenosis.   Pulmonic Valve: The pulmonic valve was grossly normal. Pulmonic valve  regurgitation is trivial.   Aorta: Aortic dilatation noted. There is mild dilatation of the aortic  root, measuring 41 mm.   IAS/Shunts: No atrial level shunt detected by color flow Doppler.    EKG:  EKG is not ordered today.    Recent Labs: 11/13/2021: ALT 18 03/07/2022: BUN 14; Creatinine, Ser 0.98; Hemoglobin 11.1; Platelets 107; Potassium 5.3; Sodium 138  Recent Lipid Panel    Component Value Date/Time   CHOL 101 11/13/2021 0000   TRIG 149 11/13/2021 0000   HDL 35 (L) 11/13/2021 0000   CHOLHDL 2.9 11/13/2021 0000   LDLCALC 40 11/13/2021 0000      Home Medications   Current Meds  Medication Sig   acetaminophen (TYLENOL) 650 MG CR tablet Take 1,300 mg by mouth 2 (two) times daily.   allopurinol (ZYLOPRIM) 300 MG tablet Take 300 mg by mouth daily.   buPROPion (WELLBUTRIN XL) 150 MG 24 hr tablet Take 300 mg by mouth daily.   cetirizine (ZYRTEC) 10 MG tablet Take 10 mg by mouth at bedtime.   colchicine 0.6 MG tablet Take 0.6 mg by mouth every 6 (six) hours as needed (gout flares).    diphenhydramine-acetaminophen (TYLENOL PM) 25-500 MG TABS tablet Take 2 tablets by mouth at bedtime.   famotidine (PEPCID) 20 MG tablet Take 20 mg by mouth at bedtime.   fluticasone (FLONASE) 50 MCG/ACT nasal spray Place 1-2  sprays into both nostrils at bedtime as needed for allergies.   gabapentin (NEURONTIN) 300 MG capsule Take 600 mg by mouth 3 (three) times daily.   Glucosamine-Chondroitin (COSAMIN DS PO) Take 1 tablet by mouth daily.   Multiple Vitamin (MULTIVITAMIN WITH MINERALS) TABS tablet Take 1 tablet by mouth daily. Complete Multivitamin   Probiotic Product (ALIGN) 4 MG CAPS Take 4 mg by mouth daily as needed (gut health).   rosuvastatin (CRESTOR) 10 MG tablet Take 10 mg by mouth daily.   SALINE NASAL MIST NA Place 1 spray into the nose 3 (three) times daily as needed (congestion).   tamsulosin (FLOMAX) 0.4 MG CAPS capsule Take 0.8 mg by mouth every evening.   TURMERIC PO Take 1,000 mg by mouth daily.     Review of Systems      All other systems reviewed and are otherwise negative except as noted above.  Physical Exam    VS:  BP (!) 140/68   Pulse 80   Ht 6' 1" (1.854 m)   Wt 198 lb 9.6 oz (90.1 kg)   SpO2 96%   BMI 26.20 kg/m  , BMI Body mass index is 26.2 kg/m.  Wt Readings from Last 3 Encounters:  08/08/22 198 lb 9.6 oz (90.1 kg)  03/06/22 181 lb (82.1 kg)  03/01/22 181 lb (82.1 kg)     GEN: Well nourished, well developed, in no acute distress. HEENT: normal. Neck: Supple, no JVD, carotid bruits, or masses. Cardiac: RRR, no murmurs, rubs, or gallops. No clubbing, cyanosis, edema.  Radials/PT 2+ and equal bilaterally.  Respiratory:  Respirations regular and unlabored, clear to auscultation bilaterally. GI: Soft, nontender, nondistended. MS: No deformity or atrophy. Skin: Warm and dry, no rash. Neuro:  Strength and sensation are intact. Psych: Normal affect.  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  Signed, Stina Gane N Williams [place], PA-C 08/08/2022, 3:56 PM [facility]  

## 2022-08-08 NOTE — Patient Instructions (Signed)
Medication Instructions:   Your physician recommends that you continue on your current medications as directed. Please refer to the Current Medication list given to you today.   *If you need a refill on your cardiac medications before your next appointment, please call your pharmacy*   Lab Work: [place]    If you have labs (blood work) drawn today and your tests are completely normal, you will receive your results only by: [place] (if you have MyChart) OR A paper copy in the mail If you have any lab test that is abnormal or we need to change your treatment, we will call you to review the results.   Testing/Procedures: Your physician has requested that you have a TEE. During a TEE, sound waves are used to create images of your heart. It provides your doctor with information about the size and shape of your heart and how well your heart's chambers and valves are working. In this test, a transducer is attached to the end of a flexible tube that's guided down your throat and into your esophagus (the tube leading from you mouth to your stomach) to get a more detailed image of your heart. You are not awake for the procedure. Please see the instruction sheet given to you today. For further information please visit [email or web address].    Follow-Up: At [facility], you and your health needs are our priority.  As part of our continuing mission to provide you with exceptional heart care, we have created designated Provider Care Teams.  These Care Teams include your primary Cardiologist (physician) and Advanced Practice Providers (APPs -  Physician Assistants and Nurse Practitioners) who all work together to provide you with the care you need, when you need it.  We recommend signing up for the patient portal called "MyChart".  Sign up information is provided on this After Visit Summary.  MyChart is used to connect with patients for Virtual Visits (Telemedicine).  Patients  are able to view lab/test results, encounter notes, upcoming appointments, etc.  Non-urgent messages can be sent to your provider as well.   To learn more about what you can do with MyChart, go to [email or web address].    Your next appointment:  SCHEDULED WITH   Provider:   Fransico Him, MD     Other Instructions

## 2022-08-08 NOTE — Progress Notes (Signed)
Office Visit    Patient Name: Theodore Patton Date of Encounter: 08/08/2022  PCP:  Prince Solian, [facility]  Cardiologist:  Fransico Him, MD  Advanced Practice Provider:  No care team member to display Electrophysiologist:  None   HPI    Theodore Patton is a 77 y.o. male with past medical history of aortic insufficiency, dilated aortic root, HLD, coronary calcifications presents today for follow-up appointment.  He was last seen 02/12/2022 for cardiac clearance.  History includes 2D echocardiogram 01/20/2021 which showed normal LV function with EF 65%, mild LVH, grade 1 DD with trileaflet aortic valve and calcification of the right coronary cusp leading to mild coaptation and an eccentric jet of AI with moderate AI.  There was no evidence of aortic stenosis.  Echo 01/02/2022 with LVEF 60 to 65%, grade 1 DD, restricted motion of the right coronary cusp.  Aortic valve is tricuspid.  Aortic valve regurgitation is moderate to severe.  Moderate dilatation of the aortic root, measuring 43 mm.  There is mild dilatation of the ascending aorta, measuring 38 mm.  Plan for repeat echocardiogram in 6 months time.  When he was last seen in the clinic he was walking for 30 minutes a few days per week without any limitations.  Denied chest pain, shortness of breath, orthopnea, PND, syncope, lower extremity edema, or melena.  Intermittent systolic blood pressure is slightly elevated at 140-150.  Most recent echocardiogram 07/26/2022 with LVEF 60 to 65%.  Severe AI and mildly dilated aortic root at 41 mm.  Plan for follow-up for TEE to evaluate aortic insufficiency further.  Today, he endorsed fatigue, but he is still able to do what he wants to do.  No decrease in exercise tolerance.  We discussed in detail transesophageal echocardiogram and what the procedure entails.  He agreed that this is necessary.  We also discussed the possible need for surgery down the road.  He ready has  follow-up with Dr. Radford Pax in April.  Blood pressure is pretty well-controlled today.  No issues with any of his medications.    Reports no shortness of breath nor dyspnea on exertion. Reports no chest pain, pressure, or tightness. No edema, orthopnea, PND. Reports no palpitations.    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   Past Surgical History:  Procedure Laterality Date   COLONOSCOPY     PARTIAL KNEE ARTHROPLASTY Right 04/14/2019   Procedure: UNICOMPARTMENTAL KNEE;  Surgeon: Marchia Bond, MD;  Location: [facility];  Service: Orthopedics;  Laterality: Right;   PILONIDAL CYST EXCISION     SHOULDER ARTHROSCOPY Right 2008   TOTAL SHOULDER ARTHROPLASTY Right 07/24/2016   Procedure: TOTAL SHOULDER ARTHROPLASTY;  Surgeon: Marchia Bond, MD;  Location: [facility];  Service: Orthopedics;  Laterality: Right;   TOTAL SHOULDER ARTHROPLASTY Left 03/06/2022   Procedure: TOTAL SHOULDER ARTHROPLASTY;  Surgeon: Mardelle Matte,  Vonna Kotyk, MD;  Location: [facility];  Service: Orthopedics;  Laterality: Left;   UPPER GI ENDOSCOPY      Allergies  Allergies  Allergen Reactions   Sulfa Antibiotics Other (See Comments)    Blisters in mouth    EKGs/Labs/Other Studies Reviewed:   The following studies were reviewed today:  Echocardiogram 07/25/2022  IMPRESSIONS     1. LVIDd 5.3 cm. Left ventricular ejection fraction, by estimation, is 60  to 65%. The left ventricle has  normal function. The left ventricle has no  regional wall motion abnormalities. There is mild left ventricular  hypertrophy. Left ventricular  diastolic parameters are consistent with Grade I diastolic dysfunction  (impaired relaxation).   2. Right ventricular systolic function is normal. The right ventricular  size is normal. Tricuspid regurgitation signal is inadequate for assessing  PA pressure.   3. The mitral valve is abnormal. Trivial mitral valve regurgitation.   4. Eccentric AI jet, posteriorly directed, restricted right coronary  cusp. The aortic valve is tricuspid. Aortic valve regurgitation is severe.  Aortic valve sclerosis/calcification is present, without any evidence of  aortic stenosis.   5. Aortic dilatation noted. There is mild dilatation of the aortic root,  measuring 41 mm.   Comparison(s): Changes from prior study are noted. 01/02/2022: LVEF 60-65%,  moderate to severe AI, dilated aortic root at 43 mm.   Conclusion(s)/Recommendation(s): Findings concerning for severe eccentric  AI, would consider Transesophageal Echocardiogram as symptoms dictate.   FINDINGS   Left Ventricle: LVIDd 5.3 cm. Left ventricular ejection fraction, by  estimation, is 60 to 65%. The left ventricle has normal function. The left  ventricle has no regional wall motion abnormalities. The left ventricular  internal cavity size was normal in  size. There is mild left ventricular hypertrophy. Left ventricular  diastolic parameters are consistent with Grade I diastolic dysfunction  (impaired relaxation). Indeterminate filling pressures.   Right Ventricle: The right ventricular size is normal. No increase in  right ventricular wall thickness. Right ventricular systolic function is  normal. Tricuspid regurgitation signal is inadequate for assessing PA  pressure.   Left Atrium: Left atrial size was normal in size.   Right Atrium: Right atrial size was normal in size.   Pericardium: There is no  evidence of pericardial effusion.   Mitral Valve: The mitral valve is abnormal. There is mild thickening of  the anterior and posterior mitral valve leaflet(s). Trivial mitral valve  regurgitation.   Tricuspid Valve: The tricuspid valve is grossly normal. Tricuspid valve  regurgitation is not demonstrated.   Aortic Valve: Eccentric AI jet, posteriorly directed, restricted right  coronary cusp. The aortic valve is tricuspid. Aortic valve regurgitation  is severe. Aortic regurgitation PHT measures 460 msec. Aortic valve  sclerosis/calcification is present,  without any evidence of aortic stenosis.   Pulmonic Valve: The pulmonic valve was grossly normal. Pulmonic valve  regurgitation is trivial.   Aorta: Aortic dilatation noted. There is mild dilatation of the aortic  root, measuring 41 mm.   IAS/Shunts: No atrial level shunt detected by color flow Doppler.    EKG:  EKG is not ordered today.    Recent Labs: 11/13/2021: ALT 18 03/07/2022: BUN 14; Creatinine, Ser 0.98; Hemoglobin 11.1; Platelets 107; Potassium 5.3; Sodium 138  Recent Lipid Panel    Component Value Date/Time   CHOL 101 11/13/2021 0000   TRIG 149 11/13/2021 0000   HDL 35 (L) 11/13/2021 0000   CHOLHDL 2.9 11/13/2021 0000   LDLCALC 40 11/13/2021 0000  Home Medications   Current Meds  Medication Sig   acetaminophen (TYLENOL) 650 MG CR tablet Take 1,300 mg by mouth 2 (two) times daily.   allopurinol (ZYLOPRIM) 300 MG tablet Take 300 mg by mouth daily.   buPROPion (WELLBUTRIN XL) 150 MG 24 hr tablet Take 300 mg by mouth daily.   cetirizine (ZYRTEC) 10 MG tablet Take 10 mg by mouth at bedtime.   colchicine 0.6 MG tablet Take 0.6 mg by mouth every 6 (six) hours as needed (gout flares).    diphenhydramine-acetaminophen (TYLENOL PM) 25-500 MG TABS tablet Take 2 tablets by mouth at bedtime.   famotidine (PEPCID) 20 MG tablet Take 20 mg by mouth at bedtime.   fluticasone (FLONASE) 50 MCG/ACT nasal spray Place 1-2  sprays into both nostrils at bedtime as needed for allergies.   gabapentin (NEURONTIN) 300 MG capsule Take 600 mg by mouth 3 (three) times daily.   Glucosamine-Chondroitin (COSAMIN DS PO) Take 1 tablet by mouth daily.   Multiple Vitamin (MULTIVITAMIN WITH MINERALS) TABS tablet Take 1 tablet by mouth daily. Complete Multivitamin   Probiotic Product (ALIGN) 4 MG CAPS Take 4 mg by mouth daily as needed (gut health).   rosuvastatin (CRESTOR) 10 MG tablet Take 10 mg by mouth daily.   SALINE NASAL MIST NA Place 1 spray into the nose 3 (three) times daily as needed (congestion).   tamsulosin (FLOMAX) 0.4 MG CAPS capsule Take 0.8 mg by mouth every evening.   TURMERIC PO Take 1,000 mg by mouth daily.     Review of Systems      All other systems reviewed and are otherwise negative except as noted above.  Physical Exam    VS:  BP (!) 140/68   Pulse 80   Ht '6\' 1"'$  (1.854 m)   Wt 198 lb 9.6 oz (90.1 kg)   SpO2 96%   BMI 26.20 kg/m  , BMI Body mass index is 26.2 kg/m.  Wt Readings from Last 3 Encounters:  08/08/22 198 lb 9.6 oz (90.1 kg)  03/06/22 181 lb (82.1 kg)  03/01/22 181 lb (82.1 kg)     GEN: Well nourished, well developed, in no acute distress. HEENT: normal. Neck: Supple, no JVD, carotid bruits, or masses. Cardiac: RRR, no murmurs, rubs, or gallops. No clubbing, cyanosis, edema.  Radials/PT 2+ and equal bilaterally.  Respiratory:  Respirations regular and unlabored, clear to auscultation bilaterally. GI: Soft, nontender, nondistended. MS: No deformity or atrophy. Skin: Warm and dry, no rash. Neuro:  Strength and sensation are intact. Psych: Normal affect.  Assessment & Plan    Aortic regurgitation with aortic root and ascending aortic dilatation -fatigue is his main symptom -discussed TEE and patient agreed -Continue current medications which include fish oil 1000 mg daily, Crestor 10 mg daily  Elevated blood pressure without hypertension -140/68 today -continue to  monitor BP at home  Hyperlipidemia -LDL 58, HDL 38, triglycerides 93 -continue current medications    Disposition: Follow up 1 month with Fransico Him, MD or APP.  Signed, Elgie Collard, PA-C 08/08/2022, 3:56 PM [facility]

## 2022-08-09 LAB — CBC
Hematocrit: 38.3 % (ref 37.5–51.0)
Hemoglobin: 13.1 g/dL (ref 13.0–17.7)
MCH: 33.8 pg — ABNORMAL HIGH (ref 26.6–33.0)
MCHC: 34.2 g/dL (ref 31.5–35.7)
MCV: 99 fL — ABNORMAL HIGH (ref 79–97)
Platelets: 128 10*3/uL — ABNORMAL LOW (ref 150–450)
RBC: 3.88 x10E6/uL — ABNORMAL LOW (ref 4.14–5.80)
RDW: 13.8 % (ref 11.6–15.4)
WBC: 5 10*3/uL (ref 3.4–10.8)

## 2022-08-09 LAB — BASIC METABOLIC PANEL
BUN/Creatinine Ratio: 15 (ref 10–24)
BUN: 17 mg/dL (ref 8–27)
CO2: 23 mmol/L (ref 20–29)
Calcium: 9.5 mg/dL (ref 8.6–10.2)
Chloride: 102 mmol/L (ref 96–106)
Creatinine, Ser: 1.12 mg/dL (ref 0.76–1.27)
Glucose: 117 mg/dL — ABNORMAL HIGH (ref 70–99)
Potassium: 4.2 mmol/L (ref 3.5–5.2)
Sodium: 141 mmol/L (ref 134–144)
eGFR: 69 mL/min/{1.73_m2} (ref >59)

## 2022-08-20 ENCOUNTER — Ambulatory Visit: Payer: Medicare Other | Admitting: Cardiology

## 2022-08-24 ENCOUNTER — Ambulatory Visit (HOSPITAL_BASED_OUTPATIENT_CLINIC_OR_DEPARTMENT_OTHER)
Admission: RE | Admit: 2022-08-24 | Discharge: 2022-08-24 | Disposition: A | Discharge status: Home / Self Care | Payer: Medicare Other | Source: Ambulatory Visit | Attending: Cardiovascular Disease | Admitting: Cardiovascular Disease

## 2022-08-24 ENCOUNTER — Other Ambulatory Visit: Payer: Self-pay

## 2022-08-24 ENCOUNTER — Encounter (HOSPITAL_COMMUNITY): Payer: Self-pay | Admitting: Cardiovascular Disease

## 2022-08-24 ENCOUNTER — Ambulatory Visit (HOSPITAL_COMMUNITY)
Admission: RE | Admit: 2022-08-24 | Discharge: 2022-08-24 | Disposition: A | Discharge status: Home / Self Care | Payer: Medicare Other | Attending: Cardiovascular Disease | Admitting: Cardiovascular Disease

## 2022-08-24 ENCOUNTER — Ambulatory Visit (HOSPITAL_BASED_OUTPATIENT_CLINIC_OR_DEPARTMENT_OTHER): Payer: Medicare Other | Admitting: Certified Registered Nurse Anesthetist

## 2022-08-24 ENCOUNTER — Encounter (HOSPITAL_COMMUNITY)
Admission: RE | Disposition: A | Discharge status: Home / Self Care | Payer: Self-pay | Source: Home / Self Care | Attending: Cardiovascular Disease

## 2022-08-24 ENCOUNTER — Ambulatory Visit (HOSPITAL_COMMUNITY): Payer: Medicare Other | Admitting: Certified Registered Nurse Anesthetist

## 2022-08-24 DIAGNOSIS — I700 Atherosclerosis of aorta: Secondary | ICD-10-CM | POA: Insufficient documentation

## 2022-08-24 DIAGNOSIS — I2510 Atherosclerotic heart disease of native coronary artery without angina pectoris: Secondary | ICD-10-CM

## 2022-08-24 DIAGNOSIS — R030 Elevated blood-pressure reading, without diagnosis of hypertension: Secondary | ICD-10-CM | POA: Diagnosis not present

## 2022-08-24 DIAGNOSIS — I340 Nonrheumatic mitral (valve) insufficiency: Secondary | ICD-10-CM | POA: Diagnosis not present

## 2022-08-24 DIAGNOSIS — Z79899 Other long term (current) drug therapy: Secondary | ICD-10-CM | POA: Diagnosis not present

## 2022-08-24 DIAGNOSIS — I080 Rheumatic disorders of both mitral and aortic valves: Secondary | ICD-10-CM | POA: Insufficient documentation

## 2022-08-24 DIAGNOSIS — I351 Nonrheumatic aortic (valve) insufficiency: Secondary | ICD-10-CM | POA: Diagnosis not present

## 2022-08-24 DIAGNOSIS — R5383 Other fatigue: Secondary | ICD-10-CM | POA: Insufficient documentation

## 2022-08-24 DIAGNOSIS — E785 Hyperlipidemia, unspecified: Secondary | ICD-10-CM | POA: Diagnosis not present

## 2022-08-24 DIAGNOSIS — I352 Nonrheumatic aortic (valve) stenosis with insufficiency: Secondary | ICD-10-CM | POA: Diagnosis present

## 2022-08-24 DIAGNOSIS — M1990 Unspecified osteoarthritis, unspecified site: Secondary | ICD-10-CM

## 2022-08-24 DIAGNOSIS — Z87891 Personal history of nicotine dependence: Secondary | ICD-10-CM | POA: Diagnosis not present

## 2022-08-24 HISTORY — PX: TEE WITHOUT CARDIOVERSION: SHX5443

## 2022-08-24 LAB — ECHO TEE
AR max vel: 5.6 cm2
AV Area VTI: 6.18 cm2
AV Area mean vel: 5.45 cm2
AV Mean grad: 4.2 mmHg
AV Peak grad: 10 mmHg
AV Vena cont: 0.48 cm
Ao pk vel: 1.58 m/s
P 1/2 time: 280 msec

## 2022-08-24 SURGERY — ECHOCARDIOGRAM, TRANSESOPHAGEAL
Anesthesia: Monitor Anesthesia Care

## 2022-08-24 MED ORDER — PROPOFOL 10 MG/ML IV BOLUS
INTRAVENOUS | Status: DC | PRN
  Administered 2022-08-24: 60 mg via INTRAVENOUS
  Administered 2022-08-24: 20 mg via INTRAVENOUS

## 2022-08-24 MED ORDER — SODIUM CHLORIDE 0.9 % IV SOLN: INTRAVENOUS | Status: DC

## 2022-08-24 MED ORDER — PROPOFOL 500 MG/50ML IV EMUL
INTRAVENOUS | Status: DC | PRN
  Administered 2022-08-24: 75 ug/kg/min via INTRAVENOUS

## 2022-08-24 NOTE — Progress Notes (Signed)
  Echocardiogram Echocardiogram Transesophageal has been performed.  Theodore Patton 08/24/2022, 9:43 AM

## 2022-08-24 NOTE — Anesthesia Procedure Notes (Signed)
Procedure Name: MAC Date/Time: 08/24/2022 9:10 AM  Performed by: Darletta Moll, CRNAPre-anesthesia Checklist: Patient identified, Emergency Drugs available, Suction available and Patient being monitored Patient Re-evaluated:Patient Re-evaluated prior to induction Oxygen Delivery Method: Nasal cannula

## 2022-08-24 NOTE — Anesthesia Preprocedure Evaluation (Addendum)
Anesthesia Evaluation  Patient identified by MRN, date of birth, ID band Patient awake    Reviewed: Allergy & Precautions, NPO status , Patient's Chart, lab work & pertinent test results  History of Anesthesia Complications (+) PONV and history of anesthetic complications  Airway Mallampati: II  TM Distance: >3 FB Neck ROM: Full    Dental  (+) Chipped   Pulmonary former smoker   Pulmonary exam normal        Cardiovascular + CAD  Normal cardiovascular exam+ Valvular Problems/Murmurs AI    '24 TTE - EF 60 to 65%. There is mild left ventricular hypertrophy. Grade I diastolic dysfunction (impaired relaxation). Trivial mitral valve regurgitation. Eccentric AI jet, posteriorly directed, restricted right coronary cusp. Aortic valve regurgitation is severe. There is mild dilatation of the aortic root, measuring 41 mm.     Neuro/Psych negative neurological ROS  negative psych ROS   GI/Hepatic Neg liver ROS,GERD  Controlled and Medicated,,  Endo/Other  negative endocrine ROS    Renal/GU negative Renal ROS     Musculoskeletal  (+) Arthritis ,   Gout    Abdominal   Peds  Hematology  Plt 120k    Anesthesia Other Findings   Reproductive/Obstetrics                             Anesthesia Physical Anesthesia Plan  ASA: 3  Anesthesia Plan: MAC   Post-op Pain Management: Minimal or no pain anticipated   Induction: Intravenous  PONV Risk Score and Plan: 2 and Propofol infusion and Treatment may vary due to age or medical condition  Airway Management Planned: Natural Airway and Nasal Cannula  Additional Equipment: None  Intra-op Plan:   Post-operative Plan:   Informed Consent: I have reviewed the patients History and Physical, chart, labs and discussed the procedure including the risks, benefits and alternatives for the proposed anesthesia with the patient or authorized representative who has  indicated his/her understanding and acceptance.       Plan Discussed with: CRNA and Anesthesiologist  Anesthesia Plan Comments:         Anesthesia Quick Evaluation

## 2022-08-24 NOTE — Interval H&P Note (Signed)
History and Physical Interval Note:  08/24/2022 8:50 AM  Theodore Patton  has presented today for surgery, with the diagnosis of AORTIC STENOSIS.  The various methods of treatment have been discussed with the patient and family. After consideration of risks, benefits and other options for treatment, the patient has consented to  Procedure(s): TRANSESOPHAGEAL ECHOCARDIOGRAM (TEE) (N/A) as a surgical intervention.  The patient's history has been reviewed, patient examined, no change in status, stable for surgery.  I have reviewed the patient's chart and labs.  Questions were answered to the patient's satisfaction.     [name]

## 2022-08-24 NOTE — Interval H&P Note (Signed)
History and Physical Interval Note:  08/24/2022 8:45 AM  Theodore Patton  has presented today for surgery, with the diagnosis of AORTIC STENOSIS.  The various methods of treatment have been discussed with the patient and family. After consideration of risks, benefits and other options for treatment, the patient has consented to  Procedure(s): TRANSESOPHAGEAL ECHOCARDIOGRAM (TEE) (N/A) as a surgical intervention.  The patient's history has been reviewed, patient examined, no change in status, stable for surgery.  I have reviewed the patient's chart and labs.  Questions were answered to the patient's satisfaction.     Elliott Lasecki

## 2022-08-24 NOTE — Op Note (Signed)
INDICATIONS: Aortic insufficiency  PROCEDURE:   Informed consent was obtained prior to the procedure. The risks, benefits and alternatives for the procedure were discussed and the patient comprehended these risks.  Risks include, but are not limited to, cough, sore throat, vomiting, nausea, somnolence, esophageal and stomach trauma or perforation, bleeding, low blood pressure, aspiration, pneumonia, infection, trauma to the teeth and death.    After a procedural time-out, the oropharynx was anesthetized with 20% benzocaine spray.   During this procedure the patient was administered IV propofol by Anesthesiology, Dr. Fransisco Beau.  The transesophageal probe was inserted in the esophagus and stomach without difficulty and multiple views were obtained.  The patient was kept under observation until the patient left the procedure room.  The patient left the procedure room in stable condition.   Agitated microbubble saline contrast was not administered.  COMPLICATIONS:    There were no immediate complications.  FINDINGS:  Trileaflet aortic valve with some thickening and mild calcification of the leaflets. The aortic annulus is dialted at 30 mm, but the aortic root is minimally dilated at 41 mm. There is highly eccentric severe aortic insufficiency. There is no aortic stenosis. The left ventricle is not dilated and LVEf is 65%. No other significnat abnormalities are seen.  RECOMMENDATIONS:    Monitor yearly with transthoracic echo for evidence of LV dilation/dysfunction and monitor for development of symptoms, which would lead to a surgical referral.  Time Spent Directly with the Patient:  45 minutes   Eyvette Cordon 08/24/2022, 9:23 AM

## 2022-08-24 NOTE — Anesthesia Postprocedure Evaluation (Signed)
Anesthesia Post Note  Patient: Theodore Patton  Procedure(s) Performed: TRANSESOPHAGEAL ECHOCARDIOGRAM (TEE)     Patient location during evaluation: PACU Anesthesia Type: MAC Level of consciousness: awake and alert Pain management: pain level controlled Vital Signs Assessment: post-procedure vital signs reviewed and stable Respiratory status: spontaneous breathing, nonlabored ventilation and respiratory function stable Cardiovascular status: stable and blood pressure returned to baseline Anesthetic complications: no   No notable events documented.  Last Vitals:  Vitals:   08/24/22 0935 08/24/22 0945  BP: 119/62 138/64  Pulse: 64 60  Resp: 14 13  Temp:    SpO2: 94% 94%    Last Pain:  Vitals:   08/24/22 0945  TempSrc:   PainSc: 0-No pain                 [name]

## 2022-08-24 NOTE — Transfer of Care (Signed)
Immediate Anesthesia Transfer of Care Note  Patient: Theodore Patton  Procedure(s) Performed: TRANSESOPHAGEAL ECHOCARDIOGRAM (TEE)  Patient Location: Endoscopy Unit  Anesthesia Type:MAC  Level of Consciousness: drowsy and patient cooperative  Airway & Oxygen Therapy: Patient Spontanous Breathing  Post-op Assessment: Report given to RN, Post -op Vital signs reviewed and stable, and Patient moving all extremities X 4  Post vital signs: Reviewed and stable  Last Vitals:  Vitals Value Taken Time  BP 114/60   Temp    Pulse 67   Resp 14   SpO2 94     Last Pain:  Vitals:   08/24/22 0826  TempSrc: Temporal  PainSc: 0-No pain         Complications: No notable events documented.

## 2022-09-06 ENCOUNTER — Ambulatory Visit: Payer: Medicare Other | Attending: Cardiology | Admitting: Cardiology

## 2022-09-06 ENCOUNTER — Encounter: Payer: Self-pay | Admitting: Cardiology

## 2022-09-06 VITALS — BP 135/70 | HR 99 | Ht 73.0 in | Wt 192.0 lb

## 2022-09-06 DIAGNOSIS — E785 Hyperlipidemia, unspecified: Secondary | ICD-10-CM

## 2022-09-06 DIAGNOSIS — I77810 Thoracic aortic ectasia: Secondary | ICD-10-CM

## 2022-09-06 DIAGNOSIS — I359 Nonrheumatic aortic valve disorder, unspecified: Secondary | ICD-10-CM

## 2022-09-06 DIAGNOSIS — I2510 Atherosclerotic heart disease of native coronary artery without angina pectoris: Secondary | ICD-10-CM | POA: Diagnosis not present

## 2022-09-06 DIAGNOSIS — E7800 Pure hypercholesterolemia, unspecified: Secondary | ICD-10-CM

## 2022-09-06 NOTE — Patient Instructions (Signed)
Medication Instructions:  Your physician recommends that you continue on your current medications as directed. Please refer to the Current Medication list given to you today.  *If you need a refill on your cardiac medications before your next appointment, please call your pharmacy*   Lab Work: None.  If you have labs (blood work) drawn today and your tests are completely normal, you will receive your results only by: [facility] (if you have MyChart) OR A paper copy in the mail If you have any lab test that is abnormal or we need to change your treatment, we will call you to review the results.   Testing/Procedures: None.   Follow-Up: At [facility] you and your health needs are our priority.  As part of our continuing mission to provide you with exceptional heart care, we have created designated Provider Care Teams.  These Care Teams include your primary Cardiologist (physician) and Advanced Practice Providers (APPs -  Physician Assistants and Nurse Practitioners) who all work together to provide you with the care you need, when you need it.  We recommend signing up for the patient portal called "MyChart".  Sign up information is provided on this After Visit Summary.  MyChart is used to connect with patients for Virtual Visits (Telemedicine).  Patients are able to view lab/test results, encounter notes, upcoming appointments, etc.  Non-urgent messages can be sent to your provider as well.   To learn more about what you can do with MyChart, go to [email or web address].    Your next appointment:   6 month(s)  Provider:   Fransico Him, MD     Other Instructions Dr. Radford Pax has referred you to a Structural Cardiologist who will consult with you about your Aortic Valve Disease. Someone from that department will call to get you scheduled.

## 2022-09-06 NOTE — Addendum Note (Signed)
Addended by: Joni Reining on: 09/06/2022 02:45 PM   Modules accepted: Orders

## 2022-09-06 NOTE — Progress Notes (Signed)
Virtual Visit via Video Note   This visit type was conducted due to national recommendations for restrictions regarding the COVID-19 Pandemic (e.g. social distancing) in an effort to limit this patient's exposure and mitigate transmission in our community.  Due to his co-morbid illnesses, this patient is at least at moderate risk for complications without adequate follow up.  This format is felt to be most appropriate for this patient at this time.  All issues noted in this document were discussed and addressed.  A limited physical exam was performed with this format.  Please refer to the patient's chart for his consent to telehealth for [facility].     Date:  09/06/2022   ID:  Theodore Patton, DOB 16-Jan-1946, MRN [id number] The patient was identified using 2 identifiers.  Patient Location: Home Provider Location: Home Office   PCP:  Prince Solian, MD   [facility] Providers Cardiologist:  Fransico Him, MD     Evaluation Performed:  Follow-Up Visit  Chief Complaint: AI, aortic root aneurysm  History of Present Illness:    Theodore Patton is a 77 y.o. 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  He also has a history of GERD and arthritis.  He had 2D echo 01/20/2021 showed normal LV function with EF 60 to 65% with mild LVH, grade 1 diastolic dysfunction with trileaflet aortic valve and calcification of the right coronary cusp leading to mild coaptation and an eccentric jet of AI with moderate AI.  There was no evidence of aortic valve stenosis.  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  There was a highly eccentric ejection jet directed the anterior mitral valve leaflet with a vena contract of 6 mm and regurgitant volume 104 cc and  regurgitant fraction 68% with ERO 0.56 cm and pressure half-time of AR 258.    He is here today for followup and is doing well.  He denies any chest pain or pressure, SOB, DOE, PND, orthopnea, LE edema, dizziness, palpitations or syncope. He complains of fatigue that he feels is getting worse.  Around 12 noon he is tired and has to sit down.  He says it is tiredness and not sleepiness.  He is able to do yard work and work around the house if it is in the am but in the afternoon cannot do any work and has to sit down.He is compliant with his meds and is tolerating meds with no SE.    Past Medical History:  Diagnosis Date   Aortic regurgitation 01/07/2019   severe and eccentric by TEE 32024 with RF 69%.   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   Past Surgical History:  Procedure Laterality Date   COLONOSCOPY     PARTIAL KNEE ARTHROPLASTY Right 04/14/2019   Procedure: UNICOMPARTMENTAL KNEE;  Surgeon:  Marchia Bond, MD;  Location: [facility];  Service: Orthopedics;  Laterality: Right;   PILONIDAL CYST EXCISION     SHOULDER ARTHROSCOPY Right 2008   TEE WITHOUT CARDIOVERSION N/A 08/24/2022   Procedure: TRANSESOPHAGEAL ECHOCARDIOGRAM (TEE);  Surgeon: Sanda Klein, MD;  Location: [place];  Service: Cardiovascular;  Laterality: N/A;   TOTAL SHOULDER ARTHROPLASTY Right 07/24/2016   Procedure: TOTAL SHOULDER ARTHROPLASTY;  Surgeon: Marchia Bond, MD;  Location: [place];  Service: Orthopedics;  Laterality: Right;   TOTAL SHOULDER ARTHROPLASTY Left 03/06/2022   Procedure: TOTAL SHOULDER  ARTHROPLASTY;  Surgeon: Marchia Bond, MD;  Location: [facility];  Service: Orthopedics;  Laterality: Left;   UPPER GI ENDOSCOPY       Current Meds  Medication Sig   acetaminophen (TYLENOL) 650 MG CR tablet Take 1,300 mg by mouth 2 (two) times daily.   allopurinol (ZYLOPRIM) 300 MG tablet Take 300 mg by mouth daily.   buPROPion (WELLBUTRIN XL) 150 MG 24 hr tablet Take 300 mg by mouth daily.   cetirizine (ZYRTEC) 10 MG tablet Take 10 mg by mouth at bedtime.   colchicine 0.6 MG tablet Take 0.6 mg by mouth every 6 (six) hours as needed (gout flares).    diphenhydramine-acetaminophen (TYLENOL PM) 25-500 MG TABS tablet Take 2 tablets by mouth at bedtime.   famotidine (PEPCID) 20 MG tablet Take 20 mg by mouth at bedtime.   fluticasone (FLONASE) 50 MCG/ACT nasal spray Place 1-2 sprays into both nostrils at bedtime as needed for allergies.   gabapentin (NEURONTIN) 300 MG capsule Take 600 mg by mouth 3 (three) times daily.   Glucosamine-Chondroitin (COSAMIN DS PO) Take 1 tablet by mouth in the morning and at bedtime.   Multiple Vitamin (MULTIVITAMIN WITH MINERALS) TABS tablet Take 1 tablet by mouth daily. Complete Multivitamin   naproxen (NAPROSYN) 500 MG tablet Take 200 mg by mouth daily.   Probiotic Product (ALIGN) 4 MG CAPS Take 4 mg by mouth daily as needed (gut health).   rosuvastatin (CRESTOR) 10 MG tablet Take 10 mg by mouth daily.   SALINE NASAL MIST NA Place 1 spray into the nose 3 (three) times daily as needed (congestion).   tamsulosin (FLOMAX) 0.4 MG CAPS capsule Take 0.8 mg by mouth every evening.   TURMERIC PO Take 1 capsule by mouth in the morning and at bedtime.     Allergies:   Sulfa antibiotics   Social History   Tobacco Use   Smoking status: Former    Types: Cigarettes    Quit date: 07/12/1980    Years since quitting: 42.1   Smokeless tobacco: Never  Vaping Use   Vaping Use: Never used  Substance Use Topics   Alcohol use: Yes    Alcohol/week: 1.0 standard drink of alcohol     Types: 1 Cans of beer per week    Comment: occ   Drug use: No     Family Hx: The patient's family history includes Healthy in his sister; Heart Problems in his sister; Liver cancer in his brother.  ROS:   Please see the history of present illness.     All other systems reviewed and are negative.   Prior CV studies:   The following studies were reviewed today:  TEE 08/2022 IMPRESSIONS    1. Left ventricular ejection fraction, by estimation, is 65 to 70%. The  left ventricle has normal function. The left ventricle has no regional  wall motion abnormalities. The left ventricular internal cavity size was  mildly dilated.  2. Right ventricular systolic function is normal. The right ventricular  size is normal. Tricuspid regurgitation signal is inadequate for assessing  PA pressure.   3. Left atrial size was mildly dilated. No left atrial/left atrial  appendage thrombus was detected.   4. The mitral valve is normal in structure. Mild mitral valve  regurgitation.   5. 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 The aortic ejection jet is highly eccentric,  directed at the anterior mitral leaflet. The AR vena contracta is 6 mm.  The regurgitant volume is 104 ml,  regurgitant fraction is 68%. The 3D guided ERO area is 0.56 cm sq. The  pressure half time of the AR jet is 258 ms. There is holodiastolic  reversal of flow in the descending aorta. The aortic valve is tricuspid.  There is mild calcification of the aortic  valve. There is mild thickening of the aortic valve. Aortic valve  regurgitation is severe. Aortic valve sclerosis is present, with no  evidence of aortic valve stenosis.   6. The aortic annulus is dilated (3.2 cm) and the sinuses of Valsalva are  mildly dilated (4.1 cm) , but the remainder of the aorta is normal in  caliber. Aortic dilatation noted. There is mild dilatation of the aortic  root, measuring 41 mm.  There is mild   (Grade II) layered plaque involving the descending aorta.   Labs/Other Tests and Data Reviewed:    EKG:  No ECG reviewed.  Recent Labs: 11/13/2021: ALT 18 08/08/2022: BUN 17; Creatinine, Ser 1.12; Hemoglobin 13.1; Platelets 128; Potassium 4.2; Sodium 141   Recent Lipid Panel Lab Results  Component Value Date/Time   CHOL 101 11/13/2021 12:00 AM   TRIG 149 11/13/2021 12:00 AM   HDL 35 (L) 11/13/2021 12:00 AM   CHOLHDL 2.9 11/13/2021 12:00 AM   LDLCALC 40 11/13/2021 12:00 AM    Wt Readings from Last 3 Encounters:  09/06/22 192 lb (87.1 kg)  08/24/22 190 lb (86.2 kg)  08/08/22 198 lb 9.6 oz (90.1 kg)     Risk Assessment/Calculations:          Objective:    Vital Signs:  BP 135/70   Pulse 99   Ht 6\' 1"  (1.854 m)   Wt 192 lb (87.1 kg)   SpO2 98%   BMI 25.33 kg/m    VITAL SIGNS:  reviewed GEN:  no acute distress EYES:  sclerae anicteric, EOMI - Extraocular Movements Intact RESPIRATORY:  normal respiratory effort, symmetric expansion CARDIOVASCULAR:  no peripheral edema SKIN:  no rash, lesions or ulcers. MUSCULOSKELETAL:  no obvious deformities. NEURO:  alert and oriented x 3, no obvious focal deficit PSYCH:  normal affect  ASSESSMENT & PLAN:    Aortic valve disease -This was previously felt to be a bicuspid aortic valve and in the past mild AS  -2D echo 01/20/2021 showed normal LV function with EF 60 to 65% with mild LVH, grade 1 diastolic dysfunction with trileaflet aortic valve and calcification of the right coronary cusp leading to mild coaptation and an eccentric jet of AI with moderate AI.  There was no evidence of aortic valve stenosis. 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  There was a highly eccentric 80 dejection jet directed the anterior mitral valve leaflet with a vena contract of 6 mm and regurgitant  volume  104 cc and regurgitant fraction 68% with ERO 0.56 cm and pressure half-time of AR 258 ms.  There was also holodiastolic flow reversal in the descending aorta all consistent with severe aortic regurgitation. -he is having a lot of fatigue during the day.  He can usually work in the house and outside in the am but by the afternoon he is exhausted and it is not sleepiness.  -I am going to refer him to structural heart team as he will need a left heart catheterization versus coronary CTA prior to referral to CVTS surgery for consideration of aortic valve replacement   Aortic root and ascending aortic dilatation -Chest CTA 02/2021 showed 30mm -repeat Chest CTA 01/2022 demonstrated no aortic aneurysm.  The aorta at the sinus of Valsalva was 32 mm x 34 x 33 mm unchanged, STJ 35 mm unchanged, ascending aorta 36 mm unchanged and aortic arch 35 mm unchanged.   Coronary calcifications -CT 2019 showed possible minimal calcified in the left circumflex but this was not visualized on most recent chest CTA 01/2022 -He has not had any anginal symptoms  Hyperlipidemia -LDL goal less than 70 -I have personally reviewed and interpreted outside labs performed by patient's PCP which showed LDL 40, triglycerides 149 and total cholesterol 101 on 11/13/2021 -Continue prescription drug management with Crestor 10 mg daily with as needed refills  Time:   Today, I have spent 15 minutes with the patient with telehealth technology discussing the above problems.     Medication Adjustments/Labs and Tests Ordered: Current medicines are reviewed at length with the patient today.  Concerns regarding medicines are outlined above.   Tests Ordered: No orders of the defined types were placed in this encounter.   Medication Changes: No orders of the defined types were placed in this encounter.   Follow Up:  In Personin 6 months  Signed, Fransico Him, MD  09/06/2022 2:36 PM    [place]

## 2022-09-14 ENCOUNTER — Telehealth: Payer: Self-pay | Admitting: Cardiology

## 2022-09-14 DIAGNOSIS — I351 Nonrheumatic aortic (valve) insufficiency: Secondary | ICD-10-CM

## 2022-09-14 NOTE — Telephone Encounter (Signed)
Results of TEE discussed with patient.  The TEE showed prolapse of the left coronary cusp and dilated aortic annulus with very eccentric AR with regurgitant fraction 68%.   Recommend right and left heart cath with Dr. Lynnette Caffey and then referral to CVTS surgery for AVR.    Shared Decision Making/Informed Consent The risks [stroke (1 in 1000), death (1 in 1000), kidney failure [usually temporary] (1 in 500), bleeding (1 in 200), allergic reaction [possibly serious] (1 in 200)], benefits (diagnostic support and management of coronary artery disease) and alternatives of a cardiac catheterization were discussed in detail with [name] and he is willing to proceed.  Please set up Right and left heart cath with Dr. Lynnette Caffey and refer to CVTS for AVR

## 2022-09-17 NOTE — Telephone Encounter (Signed)
Called patient to discuss right and left heart cath, patient verbalizes understanding of need for cath and agrees to plan for referral to cardiothoracic surgery afterwards. Patient scheduled for pre-op w/ T. Conte on 09/21/22 to complete EKG, CBC, BMP. Patient scheduled for right and left heart cath on 09/28/22 with Dr. Lynnette Caffey. Referral placed for cardiothoracic surgery. My chart message sent with cath instructions.

## 2022-09-17 NOTE — Addendum Note (Signed)
Addended by: Luellen Pucker on: 09/17/2022 09:35 AM   Modules accepted: Orders

## 2022-09-21 ENCOUNTER — Ambulatory Visit: Payer: Medicare Other | Attending: Physician Assistant | Admitting: Physician Assistant

## 2022-09-21 ENCOUNTER — Encounter: Payer: Self-pay | Admitting: Physician Assistant

## 2022-09-21 VITALS — BP 142/70 | HR 77 | Ht 73.0 in | Wt 195.0 lb

## 2022-09-21 DIAGNOSIS — I351 Nonrheumatic aortic (valve) insufficiency: Secondary | ICD-10-CM

## 2022-09-21 DIAGNOSIS — I10 Essential (primary) hypertension: Secondary | ICD-10-CM | POA: Diagnosis not present

## 2022-09-21 DIAGNOSIS — E785 Hyperlipidemia, unspecified: Secondary | ICD-10-CM | POA: Diagnosis not present

## 2022-09-21 NOTE — H&P (View-Only) (Signed)
 Office Visit    Patient Name: Theodore Patton Date of Encounter: 09/21/2022  PCP:  Avva, Ravisankar, MD   [facility]  Cardiologist:  Traci Turner, MD  Advanced Practice Provider:  No care team member to display Electrophysiologist:  None   HPI    Theodore Patton is a 76 y.o. male with past medical history of aortic insufficiency, dilated aortic root, HLD, coronary calcifications presents today for follow-up appointment.  He was last seen 02/12/2022 for cardiac clearance.  History includes 2D echocardiogram 01/20/2021 which showed normal LV function with EF 65%, mild LVH, grade 1 DD with trileaflet aortic valve and calcification of the right coronary cusp leading to mild coaptation and an eccentric jet of AI with moderate AI.  There was no evidence of aortic stenosis.  Echo 01/02/2022 with LVEF 60 to 65%, grade 1 DD, restricted motion of the right coronary cusp.  Aortic valve is tricuspid.  Aortic valve regurgitation is moderate to severe.  Moderate dilatation of the aortic root, measuring 43 mm.  There is mild dilatation of the ascending aorta, measuring 38 mm.  Plan for repeat echocardiogram in 6 months time.  When he was last seen in the clinic he was walking for 30 minutes a few days per week without any limitations.  Denied chest pain, shortness of breath, orthopnea, PND, syncope, lower extremity edema, or melena.  Intermittent systolic blood pressure is slightly elevated at 140-150.  Most recent echocardiogram 07/26/2022 with LVEF 60 to 65%.  Severe AI and mildly dilated aortic root at 41 mm.  Plan for follow-up for TEE to evaluate aortic insufficiency further.  He was seen by me back in March , he endorsed fatigue, but he is still able to do what he wants to do.  No decrease in exercise tolerance.  We discussed in detail transesophageal echocardiogram and what the procedure entails.  He agreed that this is necessary.  We also discussed the possible need for surgery  down the road.  He ready has follow-up with Dr. Turner in April.  Blood pressure is pretty well-controlled today.  No issues with any of his medications.    Today, he states he is set up for cardiac catheterization 4/26.  He does get fatigued while doing tasks and feels this might be related to his aortic valve. He has an appointment with Dr. Bartle next month.  We reviewed the expectations he should going into his consultation with Dr. Bartle.  We also these risks of his upcoming cardiac catheterization next week and ordered some labs today.  Reports no shortness of breath nor dyspnea on exertion. Reports no chest pain, pressure, or tightness. No edema, orthopnea, PND. Reports no palpitations.   Past Medical History    Past Medical History:  Diagnosis Date   Aortic regurgitation 01/07/2019   severe and eccentric by TEE [id number] with RF 69%.   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   Past Surgical History:  Procedure Laterality Date   COLONOSCOPY     PARTIAL KNEE   ARTHROPLASTY Right 04/14/2019   Procedure: UNICOMPARTMENTAL KNEE;  Surgeon: Landau, Joshua, MD;  Location: [facility];  Service: Orthopedics;  Laterality: Right;   PILONIDAL CYST EXCISION     SHOULDER ARTHROSCOPY Right 2008   TEE WITHOUT CARDIOVERSION N/A 08/24/2022   Procedure: TRANSESOPHAGEAL ECHOCARDIOGRAM (TEE);  Surgeon: Croitoru, Mihai, MD;  Location: [facility];  Service: Cardiovascular;  Laterality: N/A;   TOTAL SHOULDER ARTHROPLASTY Right 07/24/2016   Procedure: TOTAL SHOULDER ARTHROPLASTY;  Surgeon: Joshua  Landau, MD;  Location: [facility] OR;  Service: Orthopedics;  Laterality: Right;   TOTAL SHOULDER ARTHROPLASTY Left 03/06/2022   Procedure: TOTAL SHOULDER ARTHROPLASTY;  Surgeon: Landau, Joshua, MD;  Location: [facility];  Service: Orthopedics;  Laterality: Left;   UPPER GI ENDOSCOPY      Allergies  Allergies  Allergen Reactions   Sulfa Antibiotics Other (See Comments)    Blisters in mouth    EKGs/Labs/Other Studies Reviewed:   The following studies were reviewed today:  TEE 08/24/22  IMPRESSIONS     1. Left ventricular ejection fraction, by estimation, is 65 to 70%. The  left ventricle has normal function. The left ventricle has no regional  wall motion abnormalities. The left ventricular internal cavity size was  mildly dilated.   2. Right ventricular systolic function is normal. The right ventricular  size is normal. Tricuspid regurgitation signal is inadequate for assessing  PA pressure.   3. Left atrial size was mildly dilated. No left atrial/left atrial  appendage thrombus was detected.   4. The mitral valve is normal in structure. Mild mitral valve  regurgitation.   5. 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 The aortic ejection jet is highly eccentric,  directed at the anterior mitral leaflet. The AR vena contracta is 6 mm.  The regurgitant volume is 104 ml,  regurgitant fraction is 68%. The 3D guided ERO area is 0.56 cm sq. The  pressure half time of the AR jet is 258 ms. There is holodiastolic  reversal of flow in the descending aorta. The aortic valve is tricuspid.  There is mild calcification of the aortic  valve. There is mild thickening of the aortic valve. Aortic valve  regurgitation is severe. Aortic valve sclerosis is present, with no  evidence of aortic valve stenosis.   6. The aortic annulus is dilated (3.2 cm) and the sinuses of Valsalva are  mildly dilated (4.1 cm) , but the remainder of the aorta is normal in   caliber. Aortic dilatation noted. There is mild dilatation of the aortic  root, measuring 41 mm. There is mild   (Grade II) layered plaque involving the descending aorta.   Echocardiogram 07/25/2022  IMPRESSIONS     1. LVIDd 5.3 cm. Left ventricular ejection fraction, by estimation, is 60  to 65%. The left ventricle has normal function. The left ventricle has no  regional wall motion abnormalities. There is mild left ventricular  hypertrophy. Left ventricular  diastolic parameters are consistent with Grade I diastolic dysfunction  (impaired relaxation).   2. Right ventricular systolic function is normal. The right ventricular  size is normal. Tricuspid regurgitation signal is inadequate for assessing  PA pressure.   3. The mitral valve is abnormal. Trivial mitral valve regurgitation.   4. Eccentric AI jet, posteriorly directed, restricted right coronary  cusp. The aortic valve is tricuspid. Aortic valve regurgitation is severe.  Aortic valve sclerosis/calcification is present, without any evidence of  aortic stenosis.   5.   Aortic dilatation noted. There is mild dilatation of the aortic root,  measuring 41 mm.   Comparison(s): Changes from prior study are noted. 01/02/2022: LVEF 60-65%,  moderate to severe AI, dilated aortic root at 43 mm.   Conclusion(s)/Recommendation(s): Findings concerning for severe eccentric  AI, would consider Transesophageal Echocardiogram as symptoms dictate.   FINDINGS   Left Ventricle: LVIDd 5.3 cm. Left ventricular ejection fraction, by  estimation, is 60 to 65%. The left ventricle has normal function. The left  ventricle has no regional wall motion abnormalities. The left ventricular  internal cavity size was normal in  size. There is mild left ventricular hypertrophy. Left ventricular  diastolic parameters are consistent with Grade I diastolic dysfunction  (impaired relaxation). Indeterminate filling pressures.   Right Ventricle: The right  ventricular size is normal. No increase in  right ventricular wall thickness. Right ventricular systolic function is  normal. Tricuspid regurgitation signal is inadequate for assessing PA  pressure.   Left Atrium: Left atrial size was normal in size.   Right Atrium: Right atrial size was normal in size.   Pericardium: There is no evidence of pericardial effusion.   Mitral Valve: The mitral valve is abnormal. There is mild thickening of  the anterior and posterior mitral valve leaflet(s). Trivial mitral valve  regurgitation.   Tricuspid Valve: The tricuspid valve is grossly normal. Tricuspid valve  regurgitation is not demonstrated.   Aortic Valve: Eccentric AI jet, posteriorly directed, restricted right  coronary cusp. The aortic valve is tricuspid. Aortic valve regurgitation  is severe. Aortic regurgitation PHT measures 460 msec. Aortic valve  sclerosis/calcification is present,  without any evidence of aortic stenosis.   Pulmonic Valve: The pulmonic valve was grossly normal. Pulmonic valve  regurgitation is trivial.   Aorta: Aortic dilatation noted. There is mild dilatation of the aortic  root, measuring 41 mm.   IAS/Shunts: No atrial level shunt detected by color flow Doppler.    EKG:  EKG is not ordered today.    Recent Labs: 11/13/2021: ALT 18 08/08/2022: BUN 17; Creatinine, Ser 1.12; Hemoglobin 13.1; Platelets 128; Potassium 4.2; Sodium 141  Recent Lipid Panel    Component Value Date/Time   CHOL 101 11/13/2021 0000   TRIG 149 11/13/2021 0000   HDL 35 (L) 11/13/2021 0000   CHOLHDL 2.9 11/13/2021 0000   LDLCALC 40 11/13/2021 0000    Home Medications   Current Meds  Medication Sig   acetaminophen (TYLENOL) 650 MG CR tablet Take 1,300 mg by mouth 2 (two) times daily.   allopurinol (ZYLOPRIM) 300 MG tablet Take 300 mg by mouth daily.   buPROPion (WELLBUTRIN XL) 150 MG 24 hr tablet Take 300 mg by mouth daily.   cetirizine (ZYRTEC) 10 MG tablet Take 10 mg by mouth  at bedtime.   colchicine 0.6 MG tablet Take 0.6 mg by mouth every 6 (six) hours as needed (gout flares).    diphenhydramine-acetaminophen (TYLENOL PM) 25-500 MG TABS tablet Take 2 tablets by mouth at bedtime.   famotidine (PEPCID) 20 MG tablet Take 20 mg by mouth at bedtime.   fluticasone (FLONASE) 50 MCG/ACT nasal spray Place 1-2 sprays into both nostrils at bedtime as needed for allergies.   gabapentin (NEURONTIN) 300 MG capsule Take 600 mg by mouth 3 (three) times daily.   Glucosamine-Chondroitin (COSAMIN DS PO) Take 1 tablet by mouth in the morning and at bedtime.   Multiple Vitamin (MULTIVITAMIN WITH MINERALS) TABS tablet Take 1 tablet by mouth daily. Complete Multivitamin   naproxen (NAPROSYN)   500 MG tablet Take 200 mg by mouth daily.   Probiotic Product (ALIGN) 4 MG CAPS Take 4 mg by mouth daily as needed (gut health).   rosuvastatin (CRESTOR) 10 MG tablet Take 10 mg by mouth daily.   SALINE NASAL MIST NA Place 1 spray into the nose 3 (three) times daily as needed (congestion).   tamsulosin (FLOMAX) 0.4 MG CAPS capsule Take 0.8 mg by mouth every evening.   TURMERIC PO Take 1 capsule by mouth in the morning and at bedtime.     Review of Systems      All other systems reviewed and are otherwise negative except as noted above.  Physical Exam    VS:  BP (!) 142/70   Pulse 77   Ht 6' 1" (1.854 m)   Wt 195 lb (88.5 kg)   SpO2 96%   BMI 25.73 kg/m  , BMI Body mass index is 25.73 kg/m.  Wt Readings from Last 3 Encounters:  09/21/22 195 lb (88.5 kg)  09/06/22 192 lb (87.1 kg)  08/24/22 190 lb (86.2 kg)     GEN: Well nourished, well developed, in no acute distress. HEENT: normal. Neck: Supple, no JVD, carotid bruits, or masses. Cardiac: RRR, no murmurs, rubs, or gallops. No clubbing, cyanosis, edema.  Radials/PT 2+ and equal bilaterally.  Respiratory:  Respirations regular and unlabored, clear to auscultation bilaterally. GI: Soft, nontender, nondistended. MS: No deformity or  atrophy. Skin: Warm and dry, no rash. Neuro:  Strength and sensation are intact. Psych: Normal affect.  Assessment & Plan    Aortic regurgitation with aortic root and ascending aortic dilatation -fatigue is his main symptom -TEE results reviewed -Continue current medications which include fish oil 1000 mg daily, Crestor 10 mg daily -referred to structural for cardiac cath then Dr. Bartle May 8th for consultation  Elevated blood pressure without hypertension -142/70 today -continue to monitor BP at home  Hyperlipidemia -LDL 58, HDL 38, triglycerides 93 -continue current medications  The patient understands that risks include but are not limited to stroke (1 in 1000), death (1 in 1000), kidney failure [usually temporary] (1 in 500), bleeding (1 in 200), allergic reaction [possibly serious] (1 in 200), and agrees to proceed.     Disposition: Follow up 2-3 months with Traci Turner, MD or APP.  Signed, Geovanny Sartin N Butch Otterson, PA-C 09/21/2022, 4:37 PM [facility]  

## 2022-09-21 NOTE — Progress Notes (Signed)
Office Visit    Patient Name: Theodore Patton Date of Encounter: 09/21/2022  PCP:  Chilton Greathouse, MD   [facility]  Cardiologist:  Armanda Magic, MD  Advanced Practice Provider:  No care team member to display Electrophysiologist:  None   HPI    Theodore Patton is a 77 y.o. male with past medical history of aortic insufficiency, dilated aortic root, HLD, coronary calcifications presents today for follow-up appointment.  He was last seen 02/12/2022 for cardiac clearance.  History includes 2D echocardiogram 01/20/2021 which showed normal LV function with EF 65%, mild LVH, grade 1 DD with trileaflet aortic valve and calcification of the right coronary cusp leading to mild coaptation and an eccentric jet of AI with moderate AI.  There was no evidence of aortic stenosis.  Echo 01/02/2022 with LVEF 60 to 65%, grade 1 DD, restricted motion of the right coronary cusp.  Aortic valve is tricuspid.  Aortic valve regurgitation is moderate to severe.  Moderate dilatation of the aortic root, measuring 43 mm.  There is mild dilatation of the ascending aorta, measuring 38 mm.  Plan for repeat echocardiogram in 6 months time.  When he was last seen in the clinic he was walking for 30 minutes a few days per week without any limitations.  Denied chest pain, shortness of breath, orthopnea, PND, syncope, lower extremity edema, or melena.  Intermittent systolic blood pressure is slightly elevated at 140-150.  Most recent echocardiogram 07/26/2022 with LVEF 60 to 65%.  Severe AI and mildly dilated aortic root at 41 mm.  Plan for follow-up for TEE to evaluate aortic insufficiency further.  He was seen by me back in March , he endorsed fatigue, but he is still able to do what he wants to do.  No decrease in exercise tolerance.  We discussed in detail transesophageal echocardiogram and what the procedure entails.  He agreed that this is necessary.  We also discussed the possible need for surgery  down the road.  He ready has follow-up with Dr. Mayford Knife in April.  Blood pressure is pretty well-controlled today.  No issues with any of his medications.    Today, he states he is set up for cardiac catheterization 4/26.  He does get fatigued while doing tasks and feels this might be related to his aortic valve. He has an appointment with Dr. Laneta Simmers next month.  We reviewed the expectations he should going into his consultation with Dr. Laneta Simmers.  We also these risks of his upcoming cardiac catheterization next week and ordered some labs today.  Reports no shortness of breath nor dyspnea on exertion. Reports no chest pain, pressure, or tightness. No edema, orthopnea, PND. Reports no palpitations.   Past Medical History    Past Medical History:  Diagnosis Date   Aortic regurgitation 01/07/2019   severe and eccentric by TEE [id number] with RF 69%.   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   Past Surgical History:  Procedure Laterality Date   COLONOSCOPY     PARTIAL KNEE  ARTHROPLASTY Right 04/14/2019   Procedure: UNICOMPARTMENTAL KNEE;  Surgeon: Teryl Lucy, MD;  Location: [facility]S;  Service: Orthopedics;  Laterality: Right;   PILONIDAL CYST EXCISION     SHOULDER ARTHROSCOPY Right 2008   TEE WITHOUT CARDIOVERSION N/A 08/24/2022   Procedure: TRANSESOPHAGEAL ECHOCARDIOGRAM (TEE);  Surgeon: Thurmon Fair, MD;  Location: [facility] ENDOSCOPY;  Service: Cardiovascular;  Laterality: N/A;   TOTAL SHOULDER ARTHROPLASTY Right 07/24/2016   Procedure: TOTAL SHOULDER ARTHROPLASTY;  Surgeon: Teryl Lucy, MD;  Location: [facility] OR;  Service: Orthopedics;  Laterality: Right;   TOTAL SHOULDER ARTHROPLASTY Left 03/06/2022   Procedure: TOTAL SHOULDER ARTHROPLASTY;  Surgeon: Teryl Lucy, MD;  Location: [facility] ORS;  Service: Orthopedics;  Laterality: Left;   UPPER GI ENDOSCOPY      Allergies  Allergies  Allergen Reactions   Sulfa Antibiotics Other (See Comments)    Blisters in mouth    EKGs/Labs/Other Studies Reviewed:   The following studies were reviewed today:  TEE 08/24/22  IMPRESSIONS     1. Left ventricular ejection fraction, by estimation, is 65 to 70%. The  left ventricle has normal function. The left ventricle has no regional  wall motion abnormalities. The left ventricular internal cavity size was  mildly dilated.   2. Right ventricular systolic function is normal. The right ventricular  size is normal. Tricuspid regurgitation signal is inadequate for assessing  PA pressure.   3. Left atrial size was mildly dilated. No left atrial/left atrial  appendage thrombus was detected.   4. The mitral valve is normal in structure. Mild mitral valve  regurgitation.   5. 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 The aortic ejection jet is highly eccentric,  directed at the anterior mitral leaflet. The AR vena contracta is 6 mm.  The regurgitant volume is 104 ml,  regurgitant fraction is 68%. The 3D guided ERO area is 0.56 cm sq. The  pressure half time of the AR jet is 258 ms. There is holodiastolic  reversal of flow in the descending aorta. The aortic valve is tricuspid.  There is mild calcification of the aortic  valve. There is mild thickening of the aortic valve. Aortic valve  regurgitation is severe. Aortic valve sclerosis is present, with no  evidence of aortic valve stenosis.   6. The aortic annulus is dilated (3.2 cm) and the sinuses of Valsalva are  mildly dilated (4.1 cm) , but the remainder of the aorta is normal in   caliber. Aortic dilatation noted. There is mild dilatation of the aortic  root, measuring 41 mm. There is mild   (Grade II) layered plaque involving the descending aorta.   Echocardiogram 07/25/2022  IMPRESSIONS     1. LVIDd 5.3 cm. Left ventricular ejection fraction, by estimation, is 60  to 65%. The left ventricle has normal function. The left ventricle has no  regional wall motion abnormalities. There is mild left ventricular  hypertrophy. Left ventricular  diastolic parameters are consistent with Grade I diastolic dysfunction  (impaired relaxation).   2. Right ventricular systolic function is normal. The right ventricular  size is normal. Tricuspid regurgitation signal is inadequate for assessing  PA pressure.   3. The mitral valve is abnormal. Trivial mitral valve regurgitation.   4. Eccentric AI jet, posteriorly directed, restricted right coronary  cusp. The aortic valve is tricuspid. Aortic valve regurgitation is severe.  Aortic valve sclerosis/calcification is present, without any evidence of  aortic stenosis.   5.  Aortic dilatation noted. There is mild dilatation of the aortic root,  measuring 41 mm.   Comparison(s): Changes from prior study are noted. 01/02/2022: LVEF 60-65%,  moderate to severe AI, dilated aortic root at 43 mm.   Conclusion(s)/Recommendation(s): Findings concerning for severe eccentric  AI, would consider Transesophageal Echocardiogram as symptoms dictate.   FINDINGS   Left Ventricle: LVIDd 5.3 cm. Left ventricular ejection fraction, by  estimation, is 60 to 65%. The left ventricle has normal function. The left  ventricle has no regional wall motion abnormalities. The left ventricular  internal cavity size was normal in  size. There is mild left ventricular hypertrophy. Left ventricular  diastolic parameters are consistent with Grade I diastolic dysfunction  (impaired relaxation). Indeterminate filling pressures.   Right Ventricle: The right  ventricular size is normal. No increase in  right ventricular wall thickness. Right ventricular systolic function is  normal. Tricuspid regurgitation signal is inadequate for assessing PA  pressure.   Left Atrium: Left atrial size was normal in size.   Right Atrium: Right atrial size was normal in size.   Pericardium: There is no evidence of pericardial effusion.   Mitral Valve: The mitral valve is abnormal. There is mild thickening of  the anterior and posterior mitral valve leaflet(s). Trivial mitral valve  regurgitation.   Tricuspid Valve: The tricuspid valve is grossly normal. Tricuspid valve  regurgitation is not demonstrated.   Aortic Valve: Eccentric AI jet, posteriorly directed, restricted right  coronary cusp. The aortic valve is tricuspid. Aortic valve regurgitation  is severe. Aortic regurgitation PHT measures 460 msec. Aortic valve  sclerosis/calcification is present,  without any evidence of aortic stenosis.   Pulmonic Valve: The pulmonic valve was grossly normal. Pulmonic valve  regurgitation is trivial.   Aorta: Aortic dilatation noted. There is mild dilatation of the aortic  root, measuring 41 mm.   IAS/Shunts: No atrial level shunt detected by color flow Doppler.    EKG:  EKG is not ordered today.    Recent Labs: 11/13/2021: ALT 18 08/08/2022: BUN 17; Creatinine, Ser 1.12; Hemoglobin 13.1; Platelets 128; Potassium 4.2; Sodium 141  Recent Lipid Panel    Component Value Date/Time   CHOL 101 11/13/2021 0000   TRIG 149 11/13/2021 0000   HDL 35 (L) 11/13/2021 0000   CHOLHDL 2.9 11/13/2021 0000   LDLCALC 40 11/13/2021 0000    Home Medications   Current Meds  Medication Sig   acetaminophen (TYLENOL) 650 MG CR tablet Take 1,300 mg by mouth 2 (two) times daily.   allopurinol (ZYLOPRIM) 300 MG tablet Take 300 mg by mouth daily.   buPROPion (WELLBUTRIN XL) 150 MG 24 hr tablet Take 300 mg by mouth daily.   cetirizine (ZYRTEC) 10 MG tablet Take 10 mg by mouth  at bedtime.   colchicine 0.6 MG tablet Take 0.6 mg by mouth every 6 (six) hours as needed (gout flares).    diphenhydramine-acetaminophen (TYLENOL PM) 25-500 MG TABS tablet Take 2 tablets by mouth at bedtime.   famotidine (PEPCID) 20 MG tablet Take 20 mg by mouth at bedtime.   fluticasone (FLONASE) 50 MCG/ACT nasal spray Place 1-2 sprays into both nostrils at bedtime as needed for allergies.   gabapentin (NEURONTIN) 300 MG capsule Take 600 mg by mouth 3 (three) times daily.   Glucosamine-Chondroitin (COSAMIN DS PO) Take 1 tablet by mouth in the morning and at bedtime.   Multiple Vitamin (MULTIVITAMIN WITH MINERALS) TABS tablet Take 1 tablet by mouth daily. Complete Multivitamin   naproxen (NAPROSYN)  500 MG tablet Take 200 mg by mouth daily.   Probiotic Product (ALIGN) 4 MG CAPS Take 4 mg by mouth daily as needed (gut health).   rosuvastatin (CRESTOR) 10 MG tablet Take 10 mg by mouth daily.   SALINE NASAL MIST NA Place 1 spray into the nose 3 (three) times daily as needed (congestion).   tamsulosin (FLOMAX) 0.4 MG CAPS capsule Take 0.8 mg by mouth every evening.   TURMERIC PO Take 1 capsule by mouth in the morning and at bedtime.     Review of Systems      All other systems reviewed and are otherwise negative except as noted above.  Physical Exam    VS:  BP (!) 142/70   Pulse 77   Ht  (1.854 m)   Wt 195 lb (88.5 kg)   SpO2 96%   BMI 25.73 kg/m  , BMI Body mass index is 25.73 kg/m.  Wt Readings from Last 3 Encounters:  09/21/22 195 lb (88.5 kg)  09/06/22 192 lb (87.1 kg)  08/24/22 190 lb (86.2 kg)     GEN: Well nourished, well developed, in no acute distress. HEENT: normal. Neck: Supple, no JVD, carotid bruits, or masses. Cardiac: RRR, no murmurs, rubs, or gallops. No clubbing, cyanosis, edema.  Radials/PT 2+ and equal bilaterally.  Respiratory:  Respirations regular and unlabored, clear to auscultation bilaterally. GI: Soft, nontender, nondistended. MS: No deformity or  atrophy. Skin: Warm and dry, no rash. Neuro:  Strength and sensation are intact. Psych: Normal affect.  Assessment & Plan    Aortic regurgitation with aortic root and ascending aortic dilatation -fatigue is his main symptom -TEE results reviewed -Continue current medications which include fish oil 1000 mg daily, Crestor 10 mg daily -referred to structural for cardiac cath then Dr. Laneta Simmers May 8th for consultation  Elevated blood pressure without hypertension -142/70 today -continue to monitor BP at home  Hyperlipidemia -LDL 58, HDL 38, triglycerides 93 -continue current medications  The patient understands that risks include but are not limited to stroke (1 in 1000), death (1 in 1000), kidney failure [usually temporary] (1 in 500), bleeding (1 in 200), allergic reaction [possibly serious] (1 in 200), and agrees to proceed.     Disposition: Follow up 2-3 months with Armanda Magic, MD or APP.  Signed, Sharlene Dory, PA-C 09/21/2022, 4:37 PM  [facility]

## 2022-09-21 NOTE — Patient Instructions (Addendum)
Medication Instructions:   Your physician recommends that you continue on your current medications as directed. Please refer to the Current Medication list given to you today.  *If you need a refill on your cardiac medications before your next appointment, please call your pharmacy*   Lab Work: CBC AND BMET TODAY   If you have labs (blood work) drawn today and your tests are completely normal, you will receive your results only by: MyChart Message (if you have MyChart) OR A paper copy in the mail If you have any lab test that is abnormal or we need to change your treatment, we will call you to review the results.   Testing/Procedures: NONE ORDERED  TODAY    Follow-Up: At [facility], you and your health needs are our priority.  As part of our continuing mission to provide you with exceptional heart care, we have created designated Provider Care Teams.  These Care Teams include your primary Cardiologist (physician) and Advanced Practice Providers (APPs -  Physician Assistants and Nurse Practitioners) who all work together to provide you with the care you need, when you need it.  We recommend signing up for the patient portal called "MyChart".  Sign up information is provided on this After Visit Summary.  MyChart is used to connect with patients for Virtual Visits (Telemedicine).  Patients are able to view lab/test results, encounter notes, upcoming appointments, etc.  Non-urgent messages can be sent to your provider as well.   To learn more about what you can do with MyChart, go to [email or web address].    Your next appointment:   2 month(s)  Provider:   Armanda Magic, MD     Other Instructions

## 2022-09-22 LAB — BASIC METABOLIC PANEL
BUN/Creatinine Ratio: 17 (ref 10–24)
BUN: 20 mg/dL (ref 8–27)
CO2: 21 mmol/L (ref 20–29)
Calcium: 9 mg/dL (ref 8.6–10.2)
Chloride: 105 mmol/L (ref 96–106)
Creatinine, Ser: 1.19 mg/dL (ref 0.76–1.27)
Glucose: 131 mg/dL — ABNORMAL HIGH (ref 70–99)
Potassium: 4.3 mmol/L (ref 3.5–5.2)
Sodium: 140 mmol/L (ref 134–144)
eGFR: 64 mL/min/{1.73_m2} (ref >59)

## 2022-09-22 LAB — CBC
Hematocrit: 37.6 % (ref 37.5–51.0)
Hemoglobin: 12.8 g/dL — ABNORMAL LOW (ref 13.0–17.7)
MCH: 33.4 pg — ABNORMAL HIGH (ref 26.6–33.0)
MCHC: 34 g/dL (ref 31.5–35.7)
MCV: 98 fL — ABNORMAL HIGH (ref 79–97)
Platelets: 113 10*3/uL — ABNORMAL LOW (ref 150–450)
RBC: 3.83 x10E6/uL — ABNORMAL LOW (ref 4.14–5.80)
RDW: 14.1 % (ref 11.6–15.4)
WBC: 5.6 10*3/uL (ref 3.4–10.8)

## 2022-09-26 ENCOUNTER — Telehealth: Payer: Self-pay

## 2022-09-26 NOTE — Telephone Encounter (Signed)
Patient writing in on MyChart asking if he can take his morning medications before his right and left heart cath on Friday, 09/28/22. Reviewed he can take all his morning medications as he is not on any diabetes meds, diuretics, anti-platelets or anticoagulants. Also advised patient to take one dose of 81 mg aspirin. Patient verbalizes understanding.

## 2022-09-27 ENCOUNTER — Telehealth: Payer: Self-pay | Admitting: *Deleted

## 2022-09-27 NOTE — Telephone Encounter (Signed)
Cardiac Catheterization scheduled at [facility] for: Friday September 28, 2022 7:30 AM Arrival time [facility] Main Entrance A at: 5:30 AM  Nothing to eat after midnight prior to procedure, clear liquids until 5 AM day of procedure.  Medication instructions: -Usual morning medications can be taken with sips of water including aspirin 81 mg.  Confirmed patient has responsible adult to drive home post procedure and be with patient first 24 hours after arriving home.  Plan to go home the same day, you will only stay overnight if medically necessary.  Reviewed procedure instructions with patient.

## 2022-09-28 ENCOUNTER — Ambulatory Visit (HOSPITAL_COMMUNITY)
Admission: RE | Admit: 2022-09-28 | Discharge: 2022-09-28 | Disposition: A | Discharge status: Home / Self Care | Payer: Medicare Other | Attending: Internal Medicine | Admitting: Internal Medicine

## 2022-09-28 ENCOUNTER — Encounter (HOSPITAL_COMMUNITY)
Admission: RE | Disposition: A | Discharge status: Home / Self Care | Payer: Self-pay | Source: Home / Self Care | Attending: Internal Medicine

## 2022-09-28 ENCOUNTER — Other Ambulatory Visit: Payer: Self-pay

## 2022-09-28 ENCOUNTER — Encounter (HOSPITAL_COMMUNITY): Payer: Self-pay | Admitting: Internal Medicine

## 2022-09-28 DIAGNOSIS — R030 Elevated blood-pressure reading, without diagnosis of hypertension: Secondary | ICD-10-CM | POA: Insufficient documentation

## 2022-09-28 DIAGNOSIS — K219 Gastro-esophageal reflux disease without esophagitis: Secondary | ICD-10-CM | POA: Diagnosis not present

## 2022-09-28 DIAGNOSIS — Z79899 Other long term (current) drug therapy: Secondary | ICD-10-CM | POA: Diagnosis not present

## 2022-09-28 DIAGNOSIS — I351 Nonrheumatic aortic (valve) insufficiency: Secondary | ICD-10-CM | POA: Insufficient documentation

## 2022-09-28 DIAGNOSIS — I2510 Atherosclerotic heart disease of native coronary artery without angina pectoris: Secondary | ICD-10-CM | POA: Insufficient documentation

## 2022-09-28 DIAGNOSIS — E785 Hyperlipidemia, unspecified: Secondary | ICD-10-CM | POA: Insufficient documentation

## 2022-09-28 HISTORY — PX: RIGHT/LEFT HEART CATH AND CORONARY ANGIOGRAPHY: CATH118266

## 2022-09-28 LAB — POCT I-STAT 7, (LYTES, BLD GAS, ICA,H+H)
Acid-base deficit: 2 mmol/L (ref 0.0–2.0)
Bicarbonate: 24.2 mmol/L (ref 20.0–28.0)
Calcium, Ion: 1.25 mmol/L (ref 1.15–1.40)
HCT: 34 % — ABNORMAL LOW (ref 39.0–52.0)
Hemoglobin: 11.6 g/dL — ABNORMAL LOW (ref 13.0–17.0)
O2 Saturation: 93 %
Potassium: 4.7 mmol/L (ref 3.5–5.1)
Sodium: 141 mmol/L (ref 135–145)
TCO2: 26 mmol/L (ref 22–32)
pCO2 arterial: 46.3 mmHg (ref 32–48)
pH, Arterial: 7.326 — ABNORMAL LOW (ref 7.35–7.45)
pO2, Arterial: 72 mmHg — ABNORMAL LOW (ref 83–108)

## 2022-09-28 LAB — POCT I-STAT EG7
Acid-base deficit: 2 mmol/L (ref 0.0–2.0)
Acid-base deficit: 3 mmol/L — ABNORMAL HIGH (ref 0.0–2.0)
Bicarbonate: 24.4 mmol/L (ref 20.0–28.0)
Bicarbonate: 23.1 mmol/L (ref 20.0–28.0)
Calcium, Ion: 1.26 mmol/L (ref 1.15–1.40)
Calcium, Ion: 1.15 mmol/L (ref 1.15–1.40)
HCT: 35 % — ABNORMAL LOW (ref 39.0–52.0)
HCT: 32 % — ABNORMAL LOW (ref 39.0–52.0)
Hemoglobin: 11.9 g/dL — ABNORMAL LOW (ref 13.0–17.0)
Hemoglobin: 10.9 g/dL — ABNORMAL LOW (ref 13.0–17.0)
O2 Saturation: 69 %
O2 Saturation: 67 %
Potassium: 4.8 mmol/L (ref 3.5–5.1)
Potassium: 4.4 mmol/L (ref 3.5–5.1)
Sodium: 141 mmol/L (ref 135–145)
Sodium: 143 mmol/L (ref 135–145)
TCO2: 26 mmol/L (ref 22–32)
TCO2: 24 mmol/L (ref 22–32)
pCO2, Ven: 47.1 mmHg (ref 44–60)
pCO2, Ven: 44.3 mmHg (ref 44–60)
pH, Ven: 7.322 (ref 7.25–7.43)
pH, Ven: 7.326 (ref 7.25–7.43)
pO2, Ven: 39 mmHg (ref 32–45)
pO2, Ven: 38 mmHg (ref 32–45)

## 2022-09-28 SURGERY — RIGHT/LEFT HEART CATH AND CORONARY ANGIOGRAPHY
Anesthesia: LOCAL

## 2022-09-28 MED ORDER — VERAPAMIL HCL 2.5 MG/ML IV SOLN
INTRAVENOUS | Status: DC | PRN
  Administered 2022-09-28: 10 mL via INTRA_ARTERIAL

## 2022-09-28 MED ORDER — ONDANSETRON HCL 4 MG/2ML IJ SOLN: 4.0000 mg | Freq: Four times a day (QID) | INTRAMUSCULAR | Status: DC | PRN

## 2022-09-28 MED ORDER — LABETALOL HCL 5 MG/ML IV SOLN: 10.0000 mg | INTRAVENOUS | Status: DC | PRN

## 2022-09-28 MED ORDER — SODIUM CHLORIDE 0.9 % IV SOLN: 250.0000 mL | INTRAVENOUS | Status: DC | PRN

## 2022-09-28 MED ORDER — SODIUM CHLORIDE 0.9% FLUSH: 3.0000 mL | INTRAVENOUS | Status: DC | PRN

## 2022-09-28 MED ORDER — MIDAZOLAM HCL 2 MG/2ML IJ SOLN
INTRAMUSCULAR | Status: AC
  Filled 2022-09-28: qty 2

## 2022-09-28 MED ORDER — ACETAMINOPHEN 325 MG PO TABS: 650.0000 mg | ORAL_TABLET | ORAL | Status: DC | PRN

## 2022-09-28 MED ORDER — HEPARIN (PORCINE) IN NACL 1000-0.9 UT/500ML-% IV SOLN
INTRAVENOUS | Status: DC | PRN
  Administered 2022-09-28 (×2): 500 mL

## 2022-09-28 MED ORDER — VERAPAMIL HCL 2.5 MG/ML IV SOLN
INTRAVENOUS | Status: AC
  Filled 2022-09-28: qty 2

## 2022-09-28 MED ORDER — SODIUM CHLORIDE 0.9% FLUSH: 3.0000 mL | Freq: Two times a day (BID) | INTRAVENOUS | Status: DC

## 2022-09-28 MED ORDER — FENTANYL CITRATE (PF) 100 MCG/2ML IJ SOLN
INTRAMUSCULAR | Status: AC
  Filled 2022-09-28: qty 2

## 2022-09-28 MED ORDER — IOHEXOL 350 MG/ML SOLN
INTRAVENOUS | Status: DC | PRN
  Administered 2022-09-28: 45 mL

## 2022-09-28 MED ORDER — ASPIRIN 81 MG PO CHEW: 81.0000 mg | CHEWABLE_TABLET | Freq: Every day | ORAL | Status: DC

## 2022-09-28 MED ORDER — ASPIRIN 81 MG PO CHEW: 81.0000 mg | CHEWABLE_TABLET | ORAL | Status: DC

## 2022-09-28 MED ORDER — HEPARIN SODIUM (PORCINE) 1000 UNIT/ML IJ SOLN
INTRAMUSCULAR | Status: AC
  Filled 2022-09-28: qty 10

## 2022-09-28 MED ORDER — SODIUM CHLORIDE 0.9 % IV SOLN: INTRAVENOUS | Status: DC

## 2022-09-28 MED ORDER — MIDAZOLAM HCL 2 MG/2ML IJ SOLN
INTRAMUSCULAR | Status: DC | PRN
  Administered 2022-09-28: 1 mg via INTRAVENOUS

## 2022-09-28 MED ORDER — HEPARIN SODIUM (PORCINE) 1000 UNIT/ML IJ SOLN
INTRAMUSCULAR | Status: DC | PRN
  Administered 2022-09-28: 5000 [IU] via INTRAVENOUS

## 2022-09-28 MED ORDER — LIDOCAINE HCL (PF) 1 % IJ SOLN
INTRAMUSCULAR | Status: AC
  Filled 2022-09-28: qty 30

## 2022-09-28 MED ORDER — ASPIRIN 81 MG PO TBEC: 81.0000 mg | DELAYED_RELEASE_TABLET | Freq: Every day | ORAL | 2 refills | Status: AC

## 2022-09-28 MED ORDER — LIDOCAINE HCL (PF) 1 % IJ SOLN
INTRAMUSCULAR | Status: DC | PRN
  Administered 2022-09-28: 5 mL
  Administered 2022-09-28: 2 mL

## 2022-09-28 MED ORDER — HYDRALAZINE HCL 20 MG/ML IJ SOLN: 10.0000 mg | INTRAMUSCULAR | Status: DC | PRN

## 2022-09-28 MED ORDER — FENTANYL CITRATE (PF) 100 MCG/2ML IJ SOLN
INTRAMUSCULAR | Status: DC | PRN
  Administered 2022-09-28: 25 ug via INTRAVENOUS

## 2022-09-28 MED ORDER — SODIUM CHLORIDE 0.9 % WEIGHT BASED INFUSION: 1.0000 mL/kg/h | INTRAVENOUS | Status: DC

## 2022-09-28 MED ORDER — SODIUM CHLORIDE 0.9 % WEIGHT BASED INFUSION
3.0000 mL/kg/h | INTRAVENOUS | Status: AC
  Administered 2022-09-28: 3 mL/kg/h via INTRAVENOUS

## 2022-09-28 SURGICAL SUPPLY — 18 items
CATH BALLN WEDGE 5F 110CM (CATHETERS) IMPLANT
CATH DIAG 6FR JR4 (CATHETERS) IMPLANT
CATH LAUNCHER 6FR EBU 3 (CATHETERS) IMPLANT
CATH OPTITORQUE TIG 4.0 6F (CATHETERS) IMPLANT
DEVICE RAD COMP TR BAND LRG (VASCULAR PRODUCTS) IMPLANT
GLIDESHEATH SLEND SS 6F .021 (SHEATH) IMPLANT
GLIDEWIRE ANGLED SS 035X260CM (WIRE) IMPLANT
GUIDEWIRE .025 260CM (WIRE) IMPLANT
GUIDEWIRE INQWIRE 1.5J.035X260 (WIRE) IMPLANT
INQWIRE 1.5J .035X260CM (WIRE) ×1
KIT HEART LEFT (KITS) ×1 IMPLANT
PACK CARDIAC CATHETERIZATION (CUSTOM PROCEDURE TRAY) ×1 IMPLANT
SHEATH GLIDE SLENDER 4/5FR (SHEATH) IMPLANT
SHEATH PINNACLE 5F 10CM (SHEATH) IMPLANT
SHEATH PROBE COVER 6X72 (BAG) IMPLANT
TRANSDUCER W/STOPCOCK (MISCELLANEOUS) ×1 IMPLANT
TUBING CIL FLEX 10 FLL-RA (TUBING) ×1 IMPLANT
WIRE MICRO SET SILHO 5FR 7 (SHEATH) IMPLANT

## 2022-09-28 NOTE — Discharge Instructions (Addendum)
Start aspirin on 09/29/22

## 2022-09-28 NOTE — Interval H&P Note (Signed)
History and Physical Interval Note:  09/28/2022 6:27 AM  Theodore Patton  has presented today for surgery, with the diagnosis of mr.  The various methods of treatment have been discussed with the patient and family. After consideration of risks, benefits and other options for treatment, the patient has consented to  Procedure(s): RIGHT/LEFT HEART CATH AND CORONARY ANGIOGRAPHY (N/A) as a surgical intervention.  The patient's history has been reviewed, patient examined, no change in status, stable for surgery.  I have reviewed the patient's chart and labs.  Questions were answered to the patient's satisfaction.     Orbie Pyo

## 2022-10-10 ENCOUNTER — Other Ambulatory Visit: Payer: Self-pay | Admitting: *Deleted

## 2022-10-10 ENCOUNTER — Encounter: Payer: Self-pay | Admitting: *Deleted

## 2022-10-10 ENCOUNTER — Encounter: Payer: Self-pay | Admitting: Surgery

## 2022-10-10 ENCOUNTER — Institutional Professional Consult (permissible substitution): Payer: Medicare Other | Admitting: Surgery

## 2022-10-10 VITALS — BP 157/74 | HR 78 | Resp 18 | Ht 73.0 in | Wt 190.0 lb

## 2022-10-10 DIAGNOSIS — I2510 Atherosclerotic heart disease of native coronary artery without angina pectoris: Secondary | ICD-10-CM

## 2022-10-10 DIAGNOSIS — I351 Nonrheumatic aortic (valve) insufficiency: Secondary | ICD-10-CM

## 2022-10-10 NOTE — Progress Notes (Signed)
Cardiothoracic Surgery Consultation   PCP is Avva, Joylene Draft, MD Referring Provider is Quintella Reichert, MD  No chief complaint on file.   HPI:   Past Medical History:  Diagnosis Date  . Aortic regurgitation 01/07/2019   severe and eccentric by TEE [id number] with RF 69%.  . 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, completely asymptomatic  . Arthritis   . Complication of anesthesia   . Coronary artery calcification seen on CAT scan   . GERD (gastroesophageal reflux disease)   . Gout   . Heart murmur   . Hyperlipemia   . PONV (postoperative nausea and vomiting)   . Primary localized osteoarthritis of right knee 04/14/2019  . 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 COLONOSCOPY    . PARTIAL KNEE ARTHROPLASTY Right 04/14/2019   Procedure: UNICOMPARTMENTAL KNEE;  Surgeon: Teryl Lucy, MD;  Location: [facility]S;  Service: Orthopedics;  Laterality: Right;  . PILONIDAL CYST EXCISION    . RIGHT/LEFT HEART CATH AND CORONARY ANGIOGRAPHY N/A 09/28/2022   Procedure: RIGHT/LEFT HEART CATH AND CORONARY ANGIOGRAPHY;  Surgeon: Orbie Pyo, MD;  Location: [facility] INVASIVE CV LAB;  Service: Cardiovascular;  Laterality: N/A;  . SHOULDER ARTHROSCOPY Right 2008  . TEE WITHOUT CARDIOVERSION N/A 08/24/2022   Procedure: TRANSESOPHAGEAL ECHOCARDIOGRAM (TEE);  Surgeon: Thurmon Fair, MD;  Location: [facility] ENDOSCOPY;  Service: Cardiovascular;  Laterality: N/A;  . TOTAL SHOULDER ARTHROPLASTY Right 07/24/2016   Procedure: TOTAL SHOULDER ARTHROPLASTY;  Surgeon: Teryl Lucy, MD;  Location: [facility] OR;  Service: Orthopedics;  Laterality: Right;  . TOTAL SHOULDER ARTHROPLASTY Left 03/06/2022   Procedure: TOTAL SHOULDER ARTHROPLASTY;  Surgeon: Teryl Lucy, MD;  Location: [facility] ORS;  Service: Orthopedics;  Laterality: Left;  . UPPER GI ENDOSCOPY      Family History  Problem Relation Age of  Onset  . Heart Problems Sister        possibly cause of death, unknown specifics  . Liver cancer Brother        as an infant  . Healthy Sister     Social History Social History   Tobacco Use  . Smoking status: Former    Types: Cigarettes    Quit date: 07/12/1980    Years since quitting: 42.2  . Smokeless tobacco: Never  Vaping Use  . Vaping Use: Never used  Substance Use Topics  . Alcohol use: Yes    Alcohol/week: 1.0 standard drink of alcohol    Types: 1 Cans of beer per week    Comment: occ  . Drug use: No    Current Outpatient Medications  Medication Sig Dispense Refill  . acetaminophen (TYLENOL) 650 MG CR tablet Take 1,300 mg by mouth 2 (two) times daily.    Marland Kitchen allopurinol (ZYLOPRIM) 300 MG tablet Take 300 mg by mouth in the morning.    Marland Kitchen aspirin EC 81 MG tablet Take 1 tablet (81 mg total) by mouth daily. Swallow whole. 150 tablet 2  . buPROPion (WELLBUTRIN XL) 150 MG 24 hr tablet Take 300 mg by mouth in the morning.    . cetirizine (ZYRTEC) 10 MG tablet Take 10 mg by mouth at bedtime.    . colchicine 0.6 MG tablet Take 0.6 mg by mouth every 6 (six) hours as needed (gout flares).     . diphenhydramine-acetaminophen (TYLENOL PM) 25-500 MG TABS tablet Take 2 tablets by mouth at bedtime.    Marland Kitchen  famotidine (PEPCID) 20 MG tablet Take 20 mg by mouth at bedtime.    . fluticasone (FLONASE) 50 MCG/ACT nasal spray Place 1-2 sprays into both nostrils at bedtime as needed for allergies.    Marland Kitchen gabapentin (NEURONTIN) 300 MG capsule Take 600 mg by mouth 3 (three) times daily.    . Glucosamine-Chondroitin (COSAMIN DS PO) Take 1 tablet by mouth 2 (two) times daily. Triple Strength    . Multiple Vitamin (MULTIVITAMIN WITH MINERALS) TABS tablet Take 1 tablet by mouth daily. Complete Multivitamin    . naproxen sodium (ALEVE) 220 MG tablet Take 440 mg by mouth daily.    . Probiotic Product (ALIGN) 4 MG CAPS Take 4 mg by mouth daily as needed (gut health).    . rosuvastatin (CRESTOR) 10 MG tablet  Take 10 mg by mouth in the morning.    Marland Kitchen SALINE NASAL MIST NA Place 1 spray into the nose 3 (three) times daily as needed (congestion).    . tamsulosin (FLOMAX) 0.4 MG CAPS capsule Take 0.8 mg by mouth every evening.    . TURMERIC PO Take 1,000 mg by mouth in the morning and at bedtime.     No current facility-administered medications for this visit.    Allergies  Allergen Reactions  . Sulfa Antibiotics Other (See Comments)    Blisters in mouth    Review of Systems  There were no vitals taken for this visit. Physical Exam   Diagnostic Tests:   Impression:   Plan:   Alleen Borne, MD [facility] [phone]

## 2022-10-15 ENCOUNTER — Telehealth: Payer: Self-pay | Admitting: Cardiology

## 2022-10-15 ENCOUNTER — Institutional Professional Consult (permissible substitution): Payer: Medicare Other | Admitting: Internal Medicine

## 2022-10-15 NOTE — Telephone Encounter (Signed)
Patient wants to get handicap sticker for parking.

## 2022-10-15 NOTE — Telephone Encounter (Signed)
Patient with valve replacement scheduled for Thursday, Oct 18, 2022 requesting handicap placard for parking. Advised patient to call Dr. Sharee Pimple office as he has only seen Dr. Lynnette Caffey for cath and when he saw T. Conte on 09/21/22 he was walking for 30 mins a few days a week without any limitations. He endorsed fatigue at that time but stated he was still able to do what he wants to do. Advised patient that if he is able to pick up signed application from Dr. Sharee Pimple office he would then need to take it to the [facility] for placard good for 6 months. Patient verbalized understanding.

## 2022-10-15 NOTE — Pre-Procedure Instructions (Signed)
Surgical Instructions    Your procedure is scheduled on Thursday, May 16.  Report to [facility] Main Entrance "A" at 5:30 A.M., then check in with the Admitting office.  Call this number if you have problems the morning of surgery:  [phone]   If you have any questions prior to your surgery date call [phone]: Open Monday-Friday 8am-4pm If you experience any cold or flu symptoms such as cough, fever, chills, shortness of breath, etc. between now and your scheduled surgery, please notify us at the above number     Remember:  Do not eat or drink after midnight the night before your surgery     Take these medicines the morning of surgery with A SIP OF WATER:  allopurinol (ZYLOPRIM)  acetaminophen (TYLENOL)  buPROPion (WELLBUTRIN XL)  gabapentin (NEURONTIN)  rosuvastatin (CRESTOR)   If needed: colchicine  fluticasone (FLONASE)    Follow your surgeon's instructions on taking/stopping Aspirin prior to surgery.    As of today, STOP taking any  Aleve, Naproxen, Ibuprofen, Motrin, Advil, Goody's, BC's, all herbal medications, fish oil, and all vitamins.            [facility] is not responsible for any belongings or valuables.    Do NOT Smoke (Tobacco/Vaping)  24 hours prior to your procedure  If you use a CPAP at night, you may bring your mask for your overnight stay.   Contacts, glasses, hearing aids, dentures or partials may not be worn into surgery, please bring cases for these belongings   For patients admitted to the hospital, discharge time will be determined by your treatment team.   Patients discharged the day of surgery will not be allowed to drive home, and someone needs to stay with them for 24 hours.   SURGICAL WAITING ROOM VISITATION Patients having surgery or a procedure may have no more than 2 support people in the waiting area - these visitors may rotate.   Children under the age of 21 must have an adult with them who is not the patient. If the patient  needs to stay at the hospital during part of their recovery, the visitor guidelines for inpatient rooms apply. Pre-op nurse will coordinate an appropriate time for 1 support person to accompany patient in pre-op.  This support person may not rotate.   Please refer to [email or web address] for the visitor guidelines for Inpatients (after your surgery is over and you are in a regular room).    Special instructions:    Oral Hygiene is also important to reduce your risk of infection.  Remember - BRUSH YOUR TEETH THE MORNING OF SURGERY WITH YOUR REGULAR TOOTHPASTE   [facility] [place]- Preparing For Surgery  Before surgery, you can play an important role. Because skin is not sterile, your skin needs to be as free of germs as possible. You can reduce the number of germs on your skin by washing with CHG (chlorahexidine gluconate) Soap before surgery.  CHG is an antiseptic cleaner which kills germs and bonds with the skin to continue killing germs even after washing.     Please do not use if you have an allergy to CHG or antibacterial soaps. If your skin becomes reddened/irritated stop using the CHG.  Do not shave (including legs and underarms) for at least 48 hours prior to first CHG shower. It is OK to shave your face.  Please follow these instructions carefully.     Shower the NIGHT BEFORE SURGERY and the MORNING OF SURGERY with CHG Soap.  If you chose to wash your hair, wash your hair first as usual with your normal shampoo. After you shampoo, rinse your hair and body thoroughly to remove the shampoo.  Then [employer] and genitals (private parts) with your normal soap and rinse thoroughly to remove soap.  After that Use CHG Soap as you would any other liquid soap. You can apply CHG directly to the skin and wash gently with a scrungie or a clean washcloth.   Apply the CHG Soap to your body ONLY FROM THE NECK DOWN.  Do not use on open wounds or open  sores. Avoid contact with your eyes, ears, mouth and genitals (private parts). Wash Face and genitals (private parts)  with your normal soap.   Wash thoroughly, paying special attention to the area where your surgery will be performed.  Thoroughly rinse your body with warm water from the neck down.  DO NOT shower/wash with your normal soap after using and rinsing off the CHG Soap.  Pat yourself dry with a CLEAN TOWEL.  Wear CLEAN PAJAMAS to bed the night before surgery  Place CLEAN SHEETS on your bed the night before your surgery  DO NOT SLEEP WITH PETS.   Day of Surgery:  Take a shower with CHG soap. Wear Clean/Comfortable clothing the morning of surgery Do not wear jewelry or makeup. Do not wear lotions, powders, perfumes/cologne or deodorant. Do not shave 48 hours prior to surgery.  Men may shave face and neck. Do not bring valuables to the hospital. Do not wear nail polish, gel polish, artificial nails, or any other type of covering on natural nails (fingers and toes) If you have artificial nails or gel coating that need to be removed by a nail salon, please have this removed prior to surgery. Artificial nails or gel coating may interfere with anesthesia's ability to adequately monitor your vital signs. Remember to brush your teeth WITH YOUR REGULAR TOOTHPASTE.    If you received a COVID test during your pre-op visit, it is requested that you wear a mask when out in public, stay away from anyone that may not be feeling well, and notify your surgeon if you develop symptoms. If you have been in contact with anyone that has tested positive in the last 10 days, please notify your surgeon.    Please read over the following fact sheets that you were given.

## 2022-10-16 ENCOUNTER — Encounter (HOSPITAL_COMMUNITY): Payer: Self-pay

## 2022-10-16 ENCOUNTER — Ambulatory Visit (HOSPITAL_BASED_OUTPATIENT_CLINIC_OR_DEPARTMENT_OTHER)
Admission: RE | Admit: 2022-10-16 | Discharge: 2022-10-16 | Disposition: A | Discharge status: Home / Self Care | Payer: Medicare Other | Source: Ambulatory Visit | Attending: Surgery | Admitting: Surgery

## 2022-10-16 ENCOUNTER — Other Ambulatory Visit: Payer: Self-pay

## 2022-10-16 ENCOUNTER — Ambulatory Visit (HOSPITAL_COMMUNITY)
Admission: RE | Admit: 2022-10-16 | Discharge: 2022-10-16 | Disposition: A | Discharge status: Home / Self Care | Payer: Medicare Other | Source: Ambulatory Visit | Attending: Surgery | Admitting: Surgery

## 2022-10-16 ENCOUNTER — Encounter (HOSPITAL_COMMUNITY)
Admission: RE | Admit: 2022-10-16 | Discharge: 2022-10-16 | Disposition: A | Discharge status: Home / Self Care | Payer: Medicare Other | Source: Ambulatory Visit | Attending: Surgery | Admitting: Surgery

## 2022-10-16 VITALS — BP 133/57 | HR 70 | Temp 97.9°F | Resp 17 | Ht 73.0 in | Wt 194.5 lb

## 2022-10-16 DIAGNOSIS — I2510 Atherosclerotic heart disease of native coronary artery without angina pectoris: Secondary | ICD-10-CM | POA: Insufficient documentation

## 2022-10-16 DIAGNOSIS — I351 Nonrheumatic aortic (valve) insufficiency: Secondary | ICD-10-CM

## 2022-10-16 DIAGNOSIS — R001 Bradycardia, unspecified: Secondary | ICD-10-CM | POA: Insufficient documentation

## 2022-10-16 DIAGNOSIS — Z01818 Encounter for other preprocedural examination: Secondary | ICD-10-CM | POA: Insufficient documentation

## 2022-10-16 DIAGNOSIS — Z79899 Other long term (current) drug therapy: Secondary | ICD-10-CM | POA: Insufficient documentation

## 2022-10-16 DIAGNOSIS — Z1152 Encounter for screening for COVID-19: Secondary | ICD-10-CM | POA: Insufficient documentation

## 2022-10-16 LAB — COMPREHENSIVE METABOLIC PANEL
ALT: 19 U/L (ref 0–44)
AST: 23 U/L (ref 15–41)
Albumin: 4.3 g/dL (ref 3.5–5.0)
Alkaline Phosphatase: 49 U/L (ref 38–126)
Anion gap: 12 (ref 5–15)
BUN: 23 mg/dL (ref 8–23)
CO2: 20 mmol/L — ABNORMAL LOW (ref 22–32)
Calcium: 9 mg/dL (ref 8.9–10.3)
Chloride: 105 mmol/L (ref 98–111)
Creatinine, Ser: 1.18 mg/dL (ref 0.61–1.24)
GFR, Estimated: >60 mL/min (ref >60)
Glucose, Bld: 127 mg/dL — ABNORMAL HIGH (ref 70–99)
Potassium: 4.5 mmol/L (ref 3.5–5.1)
Sodium: 137 mmol/L (ref 135–145)
Total Bilirubin: 0.5 mg/dL (ref 0.3–1.2)
Total Protein: 6.3 g/dL — ABNORMAL LOW (ref 6.5–8.1)

## 2022-10-16 LAB — URINALYSIS, ROUTINE W REFLEX MICROSCOPIC
Bilirubin Urine: NEGATIVE
Glucose, UA: NEGATIVE mg/dL
Hgb urine dipstick: NEGATIVE
Ketones, ur: NEGATIVE mg/dL
Leukocytes,Ua: NEGATIVE
Nitrite: NEGATIVE
Protein, ur: NEGATIVE mg/dL
Specific Gravity, Urine: 1.012 (ref 1.005–1.030)
pH: 5 (ref 5.0–8.0)

## 2022-10-16 LAB — BLOOD GAS, ARTERIAL
Acid-base deficit: 2.5 mmol/L — ABNORMAL HIGH (ref 0.0–2.0)
Bicarbonate: 21.7 mmol/L (ref 20.0–28.0)
Drawn by: 58793
O2 Saturation: 98 %
Patient temperature: 37
pCO2 arterial: 35 mmHg (ref 32–48)
pH, Arterial: 7.4 (ref 7.35–7.45)
pO2, Arterial: 119 mmHg — ABNORMAL HIGH (ref 83–108)

## 2022-10-16 LAB — SURGICAL PCR SCREEN
MRSA, PCR: NEGATIVE
Staphylococcus aureus: NEGATIVE

## 2022-10-16 LAB — VAS US DOPPLER PRE CABG
Left ABI: 1.3
Right ABI: 1.44

## 2022-10-16 LAB — CBC
HCT: 37.4 % — ABNORMAL LOW (ref 39.0–52.0)
Hemoglobin: 12.6 g/dL — ABNORMAL LOW (ref 13.0–17.0)
MCH: 33.9 pg (ref 26.0–34.0)
MCHC: 33.7 g/dL (ref 30.0–36.0)
MCV: 100.5 fL — ABNORMAL HIGH (ref 80.0–100.0)
Platelets: 112 10*3/uL — ABNORMAL LOW (ref 150–400)
RBC: 3.72 MIL/uL — ABNORMAL LOW (ref 4.22–5.81)
RDW: 13.2 % (ref 11.5–15.5)
WBC: 4.6 10*3/uL (ref 4.0–10.5)
nRBC: 0 % (ref 0.0–0.2)

## 2022-10-16 LAB — PROTIME-INR
INR: 1.1 (ref 0.8–1.2)
Prothrombin Time: 14.7 seconds (ref 11.4–15.2)

## 2022-10-16 LAB — HEMOGLOBIN A1C
Hgb A1c MFr Bld: 5.5 % (ref 4.8–5.6)
Mean Plasma Glucose: 111.15 mg/dL

## 2022-10-16 LAB — TYPE AND SCREEN

## 2022-10-16 LAB — APTT: aPTT: 28 seconds (ref 24–36)

## 2022-10-16 NOTE — Progress Notes (Signed)
PCP - Reita Chard, MD Cardiologist - Armanda Magic, MD  PPM/ICD - Denies  Chest x-ray - 10/16/2022 EKG - 10/16/2022 Stress Test - Denies ECHO - 08/24/2022 Cardiac Cath - 09/28/2022  Sleep Study - Denies  DM: Denies  Blood Thinner Instructions: N/A Aspirin Instructions: Patient instructed to call [facility]'s office to clarify.  ERAS Protcol - NPO PRE-SURGERY Ensure or G2- N/A  COVID TEST- Yes   Anesthesia review: Yes, cardiac history  Patient denies shortness of breath, fever, cough and chest pain at PAT appointment   All instructions explained to the patient, with a verbal understanding of the material. Patient agrees to go over the instructions while at home for a better understanding.The opportunity to ask questions was provided.

## 2022-10-17 LAB — SARS CORONAVIRUS 2 (TAT 6-24 HRS): SARS Coronavirus 2: NEGATIVE

## 2022-10-17 MED ORDER — TRANEXAMIC ACID 1000 MG/10ML IV SOLN
1.5000 mg/kg/h | INTRAVENOUS | Status: AC
  Administered 2022-10-18: 1.5 mg/kg/h via INTRAVENOUS
  Filled 2022-10-17: qty 25

## 2022-10-17 MED ORDER — NITROGLYCERIN IN D5W 200-5 MCG/ML-% IV SOLN
2.0000 ug/min | INTRAVENOUS | Status: DC
  Filled 2022-10-17: qty 250

## 2022-10-17 MED ORDER — CEFAZOLIN SODIUM-DEXTROSE 2-4 GM/100ML-% IV SOLN
2.0000 g | INTRAVENOUS | Status: DC
  Filled 2022-10-17: qty 100

## 2022-10-17 MED ORDER — TRANEXAMIC ACID (OHS) PUMP PRIME SOLUTION
2.0000 mg/kg | INTRAVENOUS | Status: DC
  Filled 2022-10-17: qty 1.76

## 2022-10-17 MED ORDER — HEPARIN 30,000 UNITS/1000 ML (OHS) CELLSAVER SOLUTION
Status: DC
  Filled 2022-10-17: qty 1000

## 2022-10-17 MED ORDER — PHENYLEPHRINE HCL-NACL 20-0.9 MG/250ML-% IV SOLN
30.0000 ug/min | INTRAVENOUS | Status: AC
  Administered 2022-10-18: 25 ug/min via INTRAVENOUS
  Filled 2022-10-17: qty 250

## 2022-10-17 MED ORDER — NOREPINEPHRINE 4 MG/250ML-% IV SOLN
0.0000 ug/min | INTRAVENOUS | Status: DC
  Filled 2022-10-17: qty 250

## 2022-10-17 MED ORDER — POTASSIUM CHLORIDE 2 MEQ/ML IV SOLN
80.0000 meq | INTRAVENOUS | Status: DC
  Filled 2022-10-17: qty 40

## 2022-10-17 MED ORDER — EPINEPHRINE HCL 5 MG/250ML IV SOLN IN NS
0.0000 ug/min | INTRAVENOUS | Status: DC
  Filled 2022-10-17: qty 250

## 2022-10-17 MED ORDER — CEFAZOLIN SODIUM-DEXTROSE 2-4 GM/100ML-% IV SOLN
2.0000 g | INTRAVENOUS | Status: AC
  Administered 2022-10-18 (×2): 2 g via INTRAVENOUS
  Filled 2022-10-17: qty 100

## 2022-10-17 MED ORDER — INSULIN REGULAR(HUMAN) IN NACL 100-0.9 UT/100ML-% IV SOLN
INTRAVENOUS | Status: AC
  Administered 2022-10-18: .7 [IU]/h via INTRAVENOUS
  Filled 2022-10-17: qty 100

## 2022-10-17 MED ORDER — MANNITOL 20 % IV SOLN
INTRAVENOUS | Status: DC
  Filled 2022-10-17: qty 13

## 2022-10-17 MED ORDER — DEXMEDETOMIDINE HCL IN NACL 400 MCG/100ML IV SOLN
0.1000 ug/kg/h | INTRAVENOUS | Status: AC
  Administered 2022-10-18: .5 ug/kg/h via INTRAVENOUS
  Filled 2022-10-17: qty 100

## 2022-10-17 MED ORDER — VANCOMYCIN HCL 1500 MG/300ML IV SOLN
1500.0000 mg | INTRAVENOUS | Status: AC
  Administered 2022-10-18: 1500 mg via INTRAVENOUS
  Filled 2022-10-17: qty 300

## 2022-10-17 MED ORDER — PLASMA-LYTE A IV SOLN
INTRAVENOUS | Status: DC
  Filled 2022-10-17: qty 2.5

## 2022-10-17 MED ORDER — TRANEXAMIC ACID (OHS) BOLUS VIA INFUSION
15.0000 mg/kg | INTRAVENOUS | Status: AC
  Administered 2022-10-18: 1323 mg via INTRAVENOUS
  Filled 2022-10-17: qty 1323

## 2022-10-17 MED ORDER — MILRINONE LACTATE IN DEXTROSE 20-5 MG/100ML-% IV SOLN
0.3000 ug/kg/min | INTRAVENOUS | Status: DC
  Filled 2022-10-17: qty 100

## 2022-10-17 NOTE — H&P (Signed)
[address]       Theodore Patton [address]             [phone]       Cardiothoracic Surgery Admission History and Physical   PCP is Avva, Ravisankar, MD Referring Provider is Quintella Reichert, MD   Chief Complaint: severe aortic insufficiency     HPI:   The patient is a 77 year old gentleman with a history of hyperlipidemia, aortic insufficiency, and a mildly dilated aortic root who was referred for consideration of aortic valve replacement surgery.  He had a 2D echocardiogram in August 2023 showing a trileaflet aortic valve with restricted motion of the right coronary cusp with moderate to severe regurgitation.  The AI pressure half-time was 318 ms.  There was mild dilation of the aortic root at 4.3 cm with the ascending aorta measured 3.8 cm.  Left ventricular ejection fraction was 60 to 65% with mild concentric LVH.  LV diastolic diameter was 5.9 cm with a systolic diameter 3.9 cm.  He had a repeat echo in February 2024 showing a posteriorly directed eccentric AI jet due to restriction of the right coronary cusp with severe aortic insufficiency.  Pressure half-time was measured at 460 ms.  Left ventricular diastolic diameter was 5.3 cm with an ejection fraction of 60 to 65%.  He subsequently underwent TEE on 08/24/2022 showing an eccentric AI jet with a pressure half-time of 258 ms.  There was holodiastolic reversal flow in the descending aorta.  He underwent cardiac catheterization on 09/28/2022 showing 70% proximal RCA stenosis.  The mid left circumflex had about 60% stenosis.  The LAD had 2 areas of 50% mid vessel stenosis.  Right and left heart filling pressures were normal.   The patient lives with his wife.  He reports exertional fatigue but does what he needs to do.  He has had some shortness of breath with exertion as well as occasionally at rest and with lying flat.  He denies any peripheral edema.       Past Medical History:  Diagnosis Date   Aortic  regurgitation 01/07/2019    severe and eccentric by TEE 32024 with RF 69%.   Aortic stenosis 04/11/2018    Tricuspid AV with calcified RCC with mild AS by echo 2019, completely asymptomatic   Arthritis     Complication of anesthesia     Coronary artery calcification seen on CAT scan     GERD (gastroesophageal reflux disease)     Gout     Heart murmur     Hyperlipemia     PONV (postoperative nausea and vomiting)     Primary localized osteoarthritis of right knee 04/14/2019   Primary localized osteoarthrosis of right shoulder 07/24/2016           Past Surgical History:  Procedure Laterality Date   COLONOSCOPY       PARTIAL KNEE ARTHROPLASTY Right 04/14/2019    Procedure: UNICOMPARTMENTAL KNEE;  Surgeon: Teryl Lucy, MD;  Location: [facility];  Service: Orthopedics;  Laterality: Right;   PILONIDAL CYST EXCISION       RIGHT/LEFT HEART CATH AND CORONARY ANGIOGRAPHY N/A 09/28/2022    Procedure: RIGHT/LEFT HEART CATH AND CORONARY ANGIOGRAPHY;  Surgeon: Orbie Pyo, MD;  Location: [facility];  Service: Cardiovascular;  Laterality: N/A;   SHOULDER ARTHROSCOPY Right 2008   TEE WITHOUT CARDIOVERSION N/A 08/24/2022    Procedure: TRANSESOPHAGEAL ECHOCARDIOGRAM (TEE);  Surgeon: Thurmon Fair, MD;  Location: [facility];  Service: Cardiovascular;  Laterality: N/A;   TOTAL SHOULDER ARTHROPLASTY Right 07/24/2016    Procedure: TOTAL SHOULDER ARTHROPLASTY;  Surgeon: Teryl Lucy, MD;  Location: [facility];  Service: Orthopedics;  Laterality: Right;   TOTAL SHOULDER ARTHROPLASTY Left 03/06/2022    Procedure: TOTAL SHOULDER ARTHROPLASTY;  Surgeon: Teryl Lucy, MD;  Location: [facility];  Service: Orthopedics;  Laterality: Left;   UPPER GI ENDOSCOPY               Family History  Problem Relation Age of Onset   Heart Problems Sister          possibly cause of death, unknown specifics   Liver cancer Brother          as an infant   Healthy Sister        Social History Social History          Tobacco Use   Smoking status: Former      Types: Cigarettes      Quit date: 07/12/1980      Years since quitting: 42.2   Smokeless tobacco: Never  Vaping Use   Vaping Use: Never used  Substance Use Topics   Alcohol use: Yes      Alcohol/week: 1.0 standard drink of alcohol      Types: 1 Cans of beer per week      Comment: occ   Drug use: No            Current Outpatient Medications  Medication Sig Dispense Refill   acetaminophen (TYLENOL) 650 MG CR tablet Take 1,300 mg by mouth 2 (two) times daily.       allopurinol (ZYLOPRIM) 300 MG tablet Take 300 mg by mouth in the morning.       aspirin EC 81 MG tablet Take 1 tablet (81 mg total) by mouth daily. Swallow whole. 150 tablet 2   buPROPion (WELLBUTRIN XL) 150 MG 24 hr tablet Take 300 mg by mouth in the morning.       cetirizine (ZYRTEC) 10 MG tablet Take 10 mg by mouth at bedtime.       colchicine 0.6 MG tablet Take 0.6 mg by mouth every 6 (six) hours as needed (gout flares).        diphenhydramine-acetaminophen (TYLENOL PM) 25-500 MG TABS tablet Take 2 tablets by mouth at bedtime.       famotidine (PEPCID) 20 MG tablet Take 20 mg by mouth at bedtime.       fluticasone (FLONASE) 50 MCG/ACT nasal spray Place 1-2 sprays into both nostrils at bedtime as needed for allergies.       gabapentin (NEURONTIN) 300 MG capsule Take 600 mg by mouth 3 (three) times daily.       Glucosamine-Chondroitin (COSAMIN DS PO) Take 1 tablet by mouth 2 (two) times daily. Triple Strength       Multiple Vitamin (MULTIVITAMIN WITH MINERALS) TABS tablet Take 1 tablet by mouth daily. Complete Multivitamin       naproxen sodium (ALEVE) 220 MG tablet Take 440 mg by mouth daily.       Probiotic Product (ALIGN) 4 MG CAPS Take 4 mg by mouth daily as needed (gut health).       rosuvastatin (CRESTOR) 10 MG tablet Take 10 mg by mouth in the morning.       SALINE NASAL MIST NA Place 1 spray into the nose 3 (three) times daily as needed (congestion).       tamsulosin  (FLOMAX) 0.4 MG CAPS capsule Take  0.8 mg by mouth every evening.       TURMERIC PO Take 1,000 mg by mouth in the morning and at bedtime.        No current facility-administered medications for this visit.           Allergies  Allergen Reactions   Sulfa Antibiotics Other (See Comments)      Blisters in mouth      Review of Systems  Constitutional:  Positive for fatigue. Negative for activity change.  HENT:  Negative for dental problem.        Sees dentist regularly.  Eyes: Negative.   Respiratory:  Positive for shortness of breath.   Cardiovascular:  Negative for chest pain, palpitations and leg swelling.  Gastrointestinal: Negative.   Genitourinary:  Positive for frequency.  Musculoskeletal:  Positive for arthralgias.  Skin: Negative.   Allergic/Immunologic: Negative.   Neurological:  Negative for dizziness and syncope.       Some memory problems  Hematological:  Bruises/bleeds easily.      There were no vitals taken for this visit. Physical Exam Constitutional:      Appearance: Normal appearance. He is normal weight.  HENT:     Head: Normocephalic and atraumatic.  Eyes:     Extraocular Movements: Extraocular movements intact.     Conjunctiva/sclera: Conjunctivae normal.     Pupils: Pupils are equal, round, and reactive to light.  Neck:     Vascular: No carotid bruit.  Cardiovascular:     Rate and Rhythm: Normal rate and regular rhythm.     Pulses: Normal pulses.     Heart sounds: Murmur heard.     Comments: 2/6 diastolic murmur at the apex. Pulmonary:     Effort: Pulmonary effort is normal.     Breath sounds: Normal breath sounds.  Abdominal:     General: Abdomen is flat. Bowel sounds are normal.     Palpations: Abdomen is soft.     Tenderness: There is no abdominal tenderness.  Musculoskeletal:        General: No swelling.  Skin:    General: Skin is warm and dry.  Neurological:     General: No focal deficit present.     Mental Status: He is alert and  oriented to person, place, and time.  Psychiatric:        Mood and Affect: Mood normal.        Behavior: Behavior normal.          Diagnostic Tests:   TRANSESOPHOGEAL ECHO REPORT       Patient Name:   Theodore Patton Date of Exam: 08/24/2022  Medical Rec #:  [id number]     Height:       73.0 in  Accession #:    [id number]    Weight:       190.0 lb  Date of Birth:  1945/11/17     BSA:          2.105 m  Patient Age:    76 years      BP:           138/64 mmHg  Patient Gender: M             HR:           68 bpm.  Exam Location:  Outpatient   Procedure: 3D Echo, Transesophageal Echo, Cardiac Doppler and Color  Doppler   Indications:   Aortic valve insufficiency    History:  Patient has prior history of Echocardiogram examinations,  most                 recent 07/25/2022. Aortic Valve Disease,  Signs/Symptoms:Murmur;                 Risk Factors:Dyslipidemia.    Sonographer:    Milda Smart  Referring Phys: [phone] MIHAI CROITORU   PROCEDURE: After discussion of the risks and benefits of a TEE, an  informed consent was obtained from the patient. TEE procedure time was 14  minutes. The transesophogeal probe was passed without difficulty through  the esophogus of the patient. Imaged  were obtained with the patient in a left lateral decubitus position.  Sedation performed by different physician. The patient was monitored while  under deep sedation. Anesthestetic sedation was provided intravenously by  Anesthesiology: 176.98mg  of  Propofol. Image quality was good. The patient's vital signs; including  heart rate, blood pressure, and oxygen saturation; remained stable  throughout the procedure. The patient developed no complications during  the procedure.    IMPRESSIONS     1. Left ventricular ejection fraction, by estimation, is 65 to 70%. The  left ventricle has normal function. The left ventricle has no regional  wall motion abnormalities. The left ventricular internal  cavity size was  mildly dilated.   2. Right ventricular systolic function is normal. The right ventricular  size is normal. Tricuspid regurgitation signal is inadequate for assessing  PA pressure.   3. Left atrial size was mildly dilated. No left atrial/left atrial  appendage thrombus was detected.   4. The mitral valve is normal in structure. Mild mitral valve  regurgitation.   5. 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 The aortic ejection jet is highly eccentric,  directed at the anterior mitral leaflet. The AR vena contracta is 6 mm.  The regurgitant volume is 104 ml,  regurgitant fraction is 68%. The 3D guided ERO area is 0.56 cm sq. The  pressure half time of the AR jet is 258 ms. There is holodiastolic  reversal of flow in the descending aorta. The aortic valve is tricuspid.  There is mild calcification of the aortic  valve. There is mild thickening of the aortic valve. Aortic valve  regurgitation is severe. Aortic valve sclerosis is present, with no  evidence of aortic valve stenosis.   6. The aortic annulus is dilated (3.2 cm) and the sinuses of Valsalva are  mildly dilated (4.1 cm) , but the remainder of the aorta is normal in  caliber. Aortic dilatation noted. There is mild dilatation of the aortic  root, measuring 41 mm. There is mild   (Grade II) layered plaque involving the descending aorta.   FINDINGS   Left Ventricle: Left ventricular ejection fraction, by estimation, is 65  to 70%. The left ventricle has normal function. The left ventricle has no  regional wall motion abnormalities. The left ventricular internal cavity  size was mildly dilated.  Concentric left ventricular hypertrophy.   Right Ventricle: The right ventricular size is normal. No increase in  right ventricular wall thickness. Right ventricular systolic function is  normal. Tricuspid regurgitation signal is inadequate for assessing PA  pressure.    Left Atrium: Left atrial size was mildly dilated. No left atrial/left  atrial appendage thrombus was detected.   Right Atrium: Right atrial size was normal in size.   Pericardium: There is no evidence of pericardial effusion.  Mitral Valve: The mitral valve is normal in structure. Mild mitral valve  regurgitation.   Tricuspid Valve: The tricuspid valve is normal in structure. Tricuspid  valve regurgitation is not demonstrated.   Aortic Valve: There is mild prolapse of the left coronary cusp and there  is dilation of the aortic annulus. The aortic ejection jet is highly  eccentric, directed at the anterior mitral leaflet. The AR vena contracta  is 6 mm. The regurgitant volume is 104   ml, regurgitant fraction is 68%. The 3D guided ERO area is 0.56 cm sq.  The pressure half time of the AR jet is 258 ms. There is holodiastolic  reversal of flow in the descending aorta. The aortic valve is tricuspid.  There is mild calcification of the  aortic valve. There is mild thickening of the aortic valve. Aortic valve  regurgitation is severe. Aortic regurgitation PHT measures 280 msec.  Aortic valve sclerosis is present, with no evidence of aortic valve  stenosis. Aortic valve mean gradient  measures 4.2 mmHg. Aortic valve peak gradient measures 10.0 mmHg. Aortic  valve area, by VTI measures 6.18 cm.   Pulmonic Valve: The pulmonic valve was normal in structure. Pulmonic valve  regurgitation is trivial.   Aorta: The aortic annulus is dilated (3.2 cm) and the sinuses of Valsalva  are mildly dilated (4.1 cm) , but the remainder of the aorta is normal in  caliber. Aortic dilatation noted. There is mild dilatation of the aortic  root, measuring 41 mm. There is  mild (Grade II) layered plaque involving the descending aorta.   IAS/Shunts: No atrial level shunt detected by color flow Doppler.   Additional Comments: Spectral Doppler performed.   LEFT VENTRICLE  PLAX 2D  LVOT diam:     2.90  cm  LV SV:         152  LV SV Index:   72  LVOT Area:     6.61 cm     AORTIC VALVE  AV Area (Vmax):    5.60 cm  AV Area (Vmean):   5.45 cm  AV Area (VTI):     6.18 cm  AV Vmax:           157.95 cm/s  AV Vmean:          94.448 cm/s  AV VTI:            0.246 m  AV Peak Grad:      10.0 mmHg  AV Mean Grad:      4.2 mmHg  LVOT Vmax:         133.81 cm/s  LVOT Vmean:        77.862 cm/s  LVOT VTI:          0.230 m  LVOT/AV VTI ratio: 0.94  AI PHT:            280 msec  AR Vena Contracta: 0.48 cm    AORTA  Ao Root diam: 4.10 cm  Ao Asc diam:  3.50 cm     SHUNTS  Systemic VTI:  0.23 m  Systemic Diam: 2.90 cm   Rachelle Hora Croitoru MD  Electronically signed by Thurmon Fair MD  Signature Date/Time: 08/24/2022/10:36:01 AM        Final        Physicians   Panel Physicians Referring Physician Case Authorizing Physician  Orbie Pyo, MD (Primary)        Procedures   RIGHT/LEFT HEART CATH AND CORONARY ANGIOGRAPHY  Conclusion       Prox LAD lesion is 20% stenosed.   Mid LAD-1 lesion is 50% stenosed.   Mid LAD-2 lesion is 50% stenosed.   Mid Cx lesion is 60% stenosed.   Prox RCA lesion is 70% stenosed.   1.  Moderate to severe multivessel disease consisting of LAD, circumflex, and right coronary artery lesions. 2.  Fick cardiac output of 6.7 L/min and Fick cardiac index of 3.1 L/min/m with the following hemodynamics:            Right atrial pressure mean of 2 mmHg            Right ventricular pressure 26/1 with a end-diastolic pressure of 4 mmHg            Wedge pressure mean of 7 mmHg            Pulmonary artery pressure of 26/5 with a mean of 10 mmHg 3.  LVEDP of 8 to 9 mmHg.   Recommendation: Cardiothoracic surgical evaluation.   Indications   Aortic valve insufficiency, etiology of cardiac valve disease unspecified [I35.1 (ICD-10-CM)]    Procedural Details   Technical Details The patient is a 77 year old male with a history of bicuspid aortic valve  with now severe aortic insufficiency and GERD who is here for coronary angiography and right heart catheterization for preoperative assessment.  After obtaining consent the patient brought to the cardiac catheterization laboratory and prepped in sterile fashion.  A previously placed right antecubital IV was exchanged for 5 [place] Terumo glide sheath.  Xylocaine was used to anesthetize the right wrist and a 6 French femoral glide sheath was placed.  5000 units heparin and 5 mg of verapamil were administered through the sheath.  We found that we could not navigate up the antecubital vein due to an occlusion and therefore micropuncture was used to gain access to the right internal jugular vein and a 5 French sheath was placed for the right heart catheterization.  Given extreme upper extremity tortuosity a 6 [place] EBU 3.0 guide was used for selective coronary angiography of the left main and a 6 [place] JR4 diagnostic catheter was used for selective angiography of the right coronary artery.  The EBU 3.0 guide was used for left heart catheterization.  A 5 French balloontipped catheter was used for right heart catheterization.  After review of the angiogram and hemodynamic data a TR band was placed and manual pressure applied to the venous access sites.  There were no acute complications. Estimated blood loss <50 mL.   During this procedure medications were administered to achieve and maintain moderate conscious sedation while the patient's heart rate, blood pressure, and oxygen saturation were continuously monitored and I was present face-to-face 100% of this time.    Medications (Filter: Administrations occurring from 0730 to 0829 on 09/28/22) Heparin (Porcine) in NaCl 1000-0.9 UT/500ML-% SOLN (mL)  Total volume: 1,000 mL Date/Time Rate/Dose/Volume Action    09/28/22 0731 500 mL Given    0731 500 mL Given    fentaNYL (SUBLIMAZE) injection (mcg)  Total dose: 25 mcg Date/Time Rate/Dose/Volume Action     09/28/22 0740 25 mcg Given    midazolam (VERSED) injection (mg)  Total dose: 1 mg Date/Time Rate/Dose/Volume Action    09/28/22 0740 1 mg Given    lidocaine (PF) (XYLOCAINE) 1 % injection (mL)  Total volume: 7 mL Date/Time Rate/Dose/Volume Action    09/28/22 0741 5 mL Given    0756 2 mL Given  Radial Cocktail/Verapamil only (mL)  Total volume: 10 mL Date/Time Rate/Dose/Volume Action    09/28/22 0802 10 mL Given    heparin sodium (porcine) injection (Units)  Total dose: 5,000 Units Date/Time Rate/Dose/Volume Action    09/28/22 0803 5,000 Units Given    iohexol (OMNIPAQUE) 350 MG/ML injection (mL)  Total volume: 45 mL Date/Time Rate/Dose/Volume Action    09/28/22 0818 45 mL Given      Sedation Time   Sedation Time Physician-1: 35 minutes 35 seconds Contrast        Administrations occurring from 0730 to 0829 on 09/28/22:  Medication Name Total Dose  iohexol (OMNIPAQUE) 350 MG/ML injection 45 mL    Radiation/Fluoro   Fluoro time: 6.7 (min) DAP: 16614 (mGycm2) Cumulative Air Kerma: 251 (mGy) Coronary Findings   Diagnostic Dominance: Right Left Anterior Descending  Prox LAD lesion is 20% stenosed.  Mid LAD-1 lesion is 50% stenosed.  Mid LAD-2 lesion is 50% stenosed.    Left Circumflex  Mid Cx lesion is 60% stenosed.    Right Coronary Artery  There is moderate diffuse disease throughout the vessel.  Prox RCA lesion is 70% stenosed.    Intervention    No interventions have been documented.    Coronary Diagrams   Diagnostic Dominance: Right  Intervention    Implants    No implant documentation for this case.    Syngo Images    Show images for CARDIAC CATHETERIZATION Images on Long Term Storage    Show images for Shyam, Heglund to Procedure Log   Procedure Log    Hemo Data   Flowsheet Row Most Recent Value  Fick Cardiac Output 6.69 L/min  Fick Cardiac Output Index 3.18 (L/min)/BSA  RA A Wave 5 mmHg  RA V Wave 3 mmHg  RA Mean 2 mmHg   RV Systolic Pressure 26 mmHg  RV Diastolic Pressure 1 mmHg  RV EDP 4 mmHg  PA Systolic Pressure 26 mmHg  PA Diastolic Pressure 5 mmHg  PA Mean 10 mmHg  PW A Wave 10 mmHg  PW V Wave 7 mmHg  PW Mean 7 mmHg  AO Systolic Pressure 137 mmHg  AO Diastolic Pressure 52 mmHg  AO Mean 86 mmHg  LV Systolic Pressure 126 mmHg  LV Diastolic Pressure 2 mmHg  LV EDP 8 mmHg  AOp Systolic Pressure 130 mmHg  AOp Diastolic Pressure 52 mmHg  AOp Mean Pressure 85 mmHg  LVp Systolic Pressure 140 mmHg  LVp Diastolic Pressure 2 mmHg  LVp EDP Pressure 9 mmHg  QP/QS 0.92  TPVR Index 3.4 HRUI  TSVR Index 27.01 HRUI  PVR SVR Ratio 0.04  TPVR/TSVR Ratio 0.13      Impression:   This 77 year old gentleman has stage D, severe, symptomatic aortic insufficiency with NYHA class II symptoms of exertional fatigue and shortness of breath consistent with chronic diastolic congestive heart failure.  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  The etiology of his aortic insufficiency is likely calcification of the right coronary leaflet with prolapse and lack of coaptation.  I agree that aortic valve replacement is indicated in this patient for relief of his symptoms and to prevent progressive left ventricular dysfunction.  I think he would be best treated with open surgical aortic valve replacement using a bioprosthetic valve and coronary bypass to the LAD, obtuse marginal, and distal right coronary artery.  I reviewed the echo and catheterization images with him and his wife and answered their questions. I discussed the operative procedure  with the patient and his wife including alternatives, benefits and risks; including but not limited to bleeding, blood transfusion, infection, stroke, myocardial infarction, graft failure, heart block requiring a permanent pacemaker, organ dysfunction, and death.  Ezra Sites understands and agrees to proceed.       Plan:   Aortic valve replacement using a  bioprosthetic valve and coronary bypass graft surgery.     Alleen Borne, MD [facility] [phone]

## 2022-10-18 ENCOUNTER — Inpatient Hospital Stay (HOSPITAL_COMMUNITY): Payer: Medicare Other

## 2022-10-18 ENCOUNTER — Encounter (HOSPITAL_COMMUNITY)
Admission: RE | Disposition: A | Discharge status: Home / Self Care | Payer: Self-pay | Source: Home / Self Care | Attending: Surgery

## 2022-10-18 ENCOUNTER — Other Ambulatory Visit: Payer: Self-pay

## 2022-10-18 ENCOUNTER — Encounter (HOSPITAL_COMMUNITY): Payer: Self-pay | Admitting: Surgery

## 2022-10-18 ENCOUNTER — Inpatient Hospital Stay (HOSPITAL_COMMUNITY)
Admission: RE | Admit: 2022-10-18 | Discharge: 2022-10-23 | DRG: 220 | Disposition: A | Discharge status: Home / Self Care | Payer: Medicare Other | Attending: Surgery | Admitting: Surgery

## 2022-10-18 ENCOUNTER — Inpatient Hospital Stay (HOSPITAL_COMMUNITY): Payer: Medicare Other | Admitting: Physician Assistant

## 2022-10-18 DIAGNOSIS — Z87891 Personal history of nicotine dependence: Secondary | ICD-10-CM

## 2022-10-18 DIAGNOSIS — I4891 Unspecified atrial fibrillation: Secondary | ICD-10-CM | POA: Diagnosis not present

## 2022-10-18 DIAGNOSIS — E871 Hypo-osmolality and hyponatremia: Secondary | ICD-10-CM | POA: Diagnosis present

## 2022-10-18 DIAGNOSIS — Z96611 Presence of right artificial shoulder joint: Secondary | ICD-10-CM | POA: Diagnosis present

## 2022-10-18 DIAGNOSIS — I77810 Thoracic aortic ectasia: Secondary | ICD-10-CM | POA: Diagnosis present

## 2022-10-18 DIAGNOSIS — Z96612 Presence of left artificial shoulder joint: Secondary | ICD-10-CM | POA: Diagnosis present

## 2022-10-18 DIAGNOSIS — Z1152 Encounter for screening for COVID-19: Secondary | ICD-10-CM

## 2022-10-18 DIAGNOSIS — M19011 Primary osteoarthritis, right shoulder: Secondary | ICD-10-CM | POA: Diagnosis present

## 2022-10-18 DIAGNOSIS — I2510 Atherosclerotic heart disease of native coronary artery without angina pectoris: Secondary | ICD-10-CM

## 2022-10-18 DIAGNOSIS — N289 Disorder of kidney and ureter, unspecified: Secondary | ICD-10-CM | POA: Diagnosis not present

## 2022-10-18 DIAGNOSIS — J9811 Atelectasis: Secondary | ICD-10-CM | POA: Diagnosis not present

## 2022-10-18 DIAGNOSIS — M109 Gout, unspecified: Secondary | ICD-10-CM | POA: Diagnosis present

## 2022-10-18 DIAGNOSIS — I351 Nonrheumatic aortic (valve) insufficiency: Secondary | ICD-10-CM

## 2022-10-18 DIAGNOSIS — D696 Thrombocytopenia, unspecified: Secondary | ICD-10-CM | POA: Diagnosis not present

## 2022-10-18 DIAGNOSIS — R791 Abnormal coagulation profile: Secondary | ICD-10-CM | POA: Diagnosis not present

## 2022-10-18 DIAGNOSIS — Z7982 Long term (current) use of aspirin: Secondary | ICD-10-CM

## 2022-10-18 DIAGNOSIS — Z96651 Presence of right artificial knee joint: Secondary | ICD-10-CM | POA: Diagnosis present

## 2022-10-18 DIAGNOSIS — M1711 Unilateral primary osteoarthritis, right knee: Secondary | ICD-10-CM | POA: Diagnosis present

## 2022-10-18 DIAGNOSIS — D62 Acute posthemorrhagic anemia: Secondary | ICD-10-CM | POA: Diagnosis not present

## 2022-10-18 DIAGNOSIS — K219 Gastro-esophageal reflux disease without esophagitis: Secondary | ICD-10-CM | POA: Diagnosis present

## 2022-10-18 DIAGNOSIS — I700 Atherosclerosis of aorta: Secondary | ICD-10-CM | POA: Diagnosis present

## 2022-10-18 DIAGNOSIS — T502X5A Adverse effect of carbonic-anhydrase inhibitors, benzothiadiazides and other diuretics, initial encounter: Secondary | ICD-10-CM | POA: Diagnosis not present

## 2022-10-18 DIAGNOSIS — I352 Nonrheumatic aortic (valve) stenosis with insufficiency: Principal | ICD-10-CM | POA: Diagnosis present

## 2022-10-18 DIAGNOSIS — Z882 Allergy status to sulfonamides status: Secondary | ICD-10-CM

## 2022-10-18 DIAGNOSIS — I350 Nonrheumatic aortic (valve) stenosis: Secondary | ICD-10-CM | POA: Diagnosis not present

## 2022-10-18 DIAGNOSIS — Z79899 Other long term (current) drug therapy: Secondary | ICD-10-CM | POA: Diagnosis not present

## 2022-10-18 DIAGNOSIS — E785 Hyperlipidemia, unspecified: Secondary | ICD-10-CM | POA: Diagnosis present

## 2022-10-18 DIAGNOSIS — I5032 Chronic diastolic (congestive) heart failure: Secondary | ICD-10-CM | POA: Diagnosis present

## 2022-10-18 DIAGNOSIS — Z951 Presence of aortocoronary bypass graft: Secondary | ICD-10-CM

## 2022-10-18 DIAGNOSIS — Z952 Presence of prosthetic heart valve: Secondary | ICD-10-CM

## 2022-10-18 HISTORY — PX: TEE WITHOUT CARDIOVERSION: SHX5443

## 2022-10-18 HISTORY — PX: AORTIC VALVE REPLACEMENT: SHX41

## 2022-10-18 HISTORY — PX: CORONARY ARTERY BYPASS GRAFT: SHX141

## 2022-10-18 LAB — POCT I-STAT 7, (LYTES, BLD GAS, ICA,H+H)
Acid-base deficit: 1 mmol/L (ref 0.0–2.0)
Acid-base deficit: 3 mmol/L — ABNORMAL HIGH (ref 0.0–2.0)
Acid-base deficit: 3 mmol/L — ABNORMAL HIGH (ref 0.0–2.0)
Acid-base deficit: 3 mmol/L — ABNORMAL HIGH (ref 0.0–2.0)
Bicarbonate: 24.4 mmol/L (ref 20.0–28.0)
Bicarbonate: 22.2 mmol/L (ref 20.0–28.0)
Bicarbonate: 22 mmol/L (ref 20.0–28.0)
Bicarbonate: 21.2 mmol/L (ref 20.0–28.0)
Calcium, Ion: 1.25 mmol/L (ref 1.15–1.40)
Calcium, Ion: 1.01 mmol/L — ABNORMAL LOW (ref 1.15–1.40)
Calcium, Ion: 1.44 mmol/L — ABNORMAL HIGH (ref 1.15–1.40)
Calcium, Ion: 1.31 mmol/L (ref 1.15–1.40)
HCT: 36 % — ABNORMAL LOW (ref 39.0–52.0)
HCT: 26 % — ABNORMAL LOW (ref 39.0–52.0)
HCT: 26 % — ABNORMAL LOW (ref 39.0–52.0)
HCT: 20 % — ABNORMAL LOW (ref 39.0–52.0)
Hemoglobin: 12.2 g/dL — ABNORMAL LOW (ref 13.0–17.0)
Hemoglobin: 8.8 g/dL — ABNORMAL LOW (ref 13.0–17.0)
Hemoglobin: 8.8 g/dL — ABNORMAL LOW (ref 13.0–17.0)
Hemoglobin: 6.8 g/dL — CL (ref 13.0–17.0)
O2 Saturation: 100 %
O2 Saturation: 100 %
O2 Saturation: 99 %
O2 Saturation: 97 %
Patient temperature: 35.3
Potassium: 4.9 mmol/L (ref 3.5–5.1)
Potassium: 5.7 mmol/L — ABNORMAL HIGH (ref 3.5–5.1)
Potassium: 5.5 mmol/L — ABNORMAL HIGH (ref 3.5–5.1)
Potassium: 4.5 mmol/L (ref 3.5–5.1)
Sodium: 138 mmol/L (ref 135–145)
Sodium: 137 mmol/L (ref 135–145)
Sodium: 137 mmol/L (ref 135–145)
Sodium: 139 mmol/L (ref 135–145)
TCO2: 26 mmol/L (ref 22–32)
TCO2: 23 mmol/L (ref 22–32)
TCO2: 23 mmol/L (ref 22–32)
TCO2: 22 mmol/L (ref 22–32)
pCO2 arterial: 42.2 mmHg (ref 32–48)
pCO2 arterial: 41.7 mmHg (ref 32–48)
pCO2 arterial: 35.9 mmHg (ref 32–48)
pCO2 arterial: 32.8 mmHg (ref 32–48)
pH, Arterial: 7.371 (ref 7.35–7.45)
pH, Arterial: 7.335 — ABNORMAL LOW (ref 7.35–7.45)
pH, Arterial: 7.396 (ref 7.35–7.45)
pH, Arterial: 7.411 (ref 7.35–7.45)
pO2, Arterial: 192 mmHg — ABNORMAL HIGH (ref 83–108)
pO2, Arterial: 405 mmHg — ABNORMAL HIGH (ref 83–108)
pO2, Arterial: 160 mmHg — ABNORMAL HIGH (ref 83–108)
pO2, Arterial: 83 mmHg (ref 83–108)

## 2022-10-18 LAB — POCT I-STAT, CHEM 8
BUN: 30 mg/dL — ABNORMAL HIGH (ref 8–23)
BUN: 19 mg/dL (ref 8–23)
BUN: 21 mg/dL (ref 8–23)
BUN: 19 mg/dL (ref 8–23)
BUN: 19 mg/dL (ref 8–23)
Calcium, Ion: 1.24 mmol/L (ref 1.15–1.40)
Calcium, Ion: 1 mmol/L — ABNORMAL LOW (ref 1.15–1.40)
Calcium, Ion: 1.21 mmol/L (ref 1.15–1.40)
Calcium, Ion: 1.13 mmol/L — ABNORMAL LOW (ref 1.15–1.40)
Calcium, Ion: 1.43 mmol/L — ABNORMAL HIGH (ref 1.15–1.40)
Chloride: 104 mmol/L (ref 98–111)
Chloride: 103 mmol/L (ref 98–111)
Chloride: 105 mmol/L (ref 98–111)
Chloride: 104 mmol/L (ref 98–111)
Chloride: 103 mmol/L (ref 98–111)
Creatinine, Ser: 1 mg/dL (ref 0.61–1.24)
Creatinine, Ser: 0.9 mg/dL (ref 0.61–1.24)
Creatinine, Ser: 1.1 mg/dL (ref 0.61–1.24)
Creatinine, Ser: 0.9 mg/dL (ref 0.61–1.24)
Creatinine, Ser: 0.9 mg/dL (ref 0.61–1.24)
Glucose, Bld: 104 mg/dL — ABNORMAL HIGH (ref 70–99)
Glucose, Bld: 107 mg/dL — ABNORMAL HIGH (ref 70–99)
Glucose, Bld: 98 mg/dL (ref 70–99)
Glucose, Bld: 127 mg/dL — ABNORMAL HIGH (ref 70–99)
Glucose, Bld: 121 mg/dL — ABNORMAL HIGH (ref 70–99)
HCT: 35 % — ABNORMAL LOW (ref 39.0–52.0)
HCT: 26 % — ABNORMAL LOW (ref 39.0–52.0)
HCT: 31 % — ABNORMAL LOW (ref 39.0–52.0)
HCT: 28 % — ABNORMAL LOW (ref 39.0–52.0)
HCT: 25 % — ABNORMAL LOW (ref 39.0–52.0)
Hemoglobin: 11.9 g/dL — ABNORMAL LOW (ref 13.0–17.0)
Hemoglobin: 10.5 g/dL — ABNORMAL LOW (ref 13.0–17.0)
Hemoglobin: 8.8 g/dL — ABNORMAL LOW (ref 13.0–17.0)
Hemoglobin: 9.5 g/dL — ABNORMAL LOW (ref 13.0–17.0)
Hemoglobin: 8.5 g/dL — ABNORMAL LOW (ref 13.0–17.0)
Potassium: 5 mmol/L (ref 3.5–5.1)
Potassium: 5.4 mmol/L — ABNORMAL HIGH (ref 3.5–5.1)
Potassium: 5.7 mmol/L — ABNORMAL HIGH (ref 3.5–5.1)
Potassium: 5.9 mmol/L — ABNORMAL HIGH (ref 3.5–5.1)
Potassium: 5.5 mmol/L — ABNORMAL HIGH (ref 3.5–5.1)
Sodium: 139 mmol/L (ref 135–145)
Sodium: 137 mmol/L (ref 135–145)
Sodium: 137 mmol/L (ref 135–145)
Sodium: 137 mmol/L (ref 135–145)
Sodium: 137 mmol/L (ref 135–145)
TCO2: 27 mmol/L (ref 22–32)
TCO2: 24 mmol/L (ref 22–32)
TCO2: 26 mmol/L (ref 22–32)
TCO2: 24 mmol/L (ref 22–32)
TCO2: 22 mmol/L (ref 22–32)

## 2022-10-18 LAB — GLUCOSE, CAPILLARY
Glucose-Capillary: 121 mg/dL — ABNORMAL HIGH (ref 70–99)
Glucose-Capillary: 114 mg/dL — ABNORMAL HIGH (ref 70–99)
Glucose-Capillary: 105 mg/dL — ABNORMAL HIGH (ref 70–99)
Glucose-Capillary: 103 mg/dL — ABNORMAL HIGH (ref 70–99)
Glucose-Capillary: 114 mg/dL — ABNORMAL HIGH (ref 70–99)
Glucose-Capillary: 116 mg/dL — ABNORMAL HIGH (ref 70–99)
Glucose-Capillary: 135 mg/dL — ABNORMAL HIGH (ref 70–99)

## 2022-10-18 LAB — MAGNESIUM: Magnesium: 2.7 mg/dL — ABNORMAL HIGH (ref 1.7–2.4)

## 2022-10-18 LAB — BASIC METABOLIC PANEL
Anion gap: 8 (ref 5–15)
BUN: 16 mg/dL (ref 8–23)
CO2: 24 mmol/L (ref 22–32)
Calcium: 8.5 mg/dL — ABNORMAL LOW (ref 8.9–10.3)
Chloride: 105 mmol/L (ref 98–111)
Creatinine, Ser: 1.09 mg/dL (ref 0.61–1.24)
GFR, Estimated: >60 mL/min (ref >60)
Glucose, Bld: 115 mg/dL — ABNORMAL HIGH (ref 70–99)
Potassium: 4.9 mmol/L (ref 3.5–5.1)
Sodium: 137 mmol/L (ref 135–145)

## 2022-10-18 LAB — PROTIME-INR
INR: 1.6 — ABNORMAL HIGH (ref 0.8–1.2)
Prothrombin Time: 18.9 seconds — ABNORMAL HIGH (ref 11.4–15.2)

## 2022-10-18 LAB — HEMOGLOBIN AND HEMATOCRIT, BLOOD
HCT: 28.2 % — ABNORMAL LOW (ref 39.0–52.0)
Hemoglobin: 9.6 g/dL — ABNORMAL LOW (ref 13.0–17.0)

## 2022-10-18 LAB — CBC
HCT: 23.8 % — ABNORMAL LOW (ref 39.0–52.0)
HCT: 20.7 % — ABNORMAL LOW (ref 39.0–52.0)
Hemoglobin: 8.1 g/dL — ABNORMAL LOW (ref 13.0–17.0)
Hemoglobin: 7 g/dL — ABNORMAL LOW (ref 13.0–17.0)
MCH: 34 pg (ref 26.0–34.0)
MCH: 33.5 pg (ref 26.0–34.0)
MCHC: 34 g/dL (ref 30.0–36.0)
MCHC: 33.8 g/dL (ref 30.0–36.0)
MCV: 100 fL (ref 80.0–100.0)
MCV: 99 fL (ref 80.0–100.0)
Platelets: 74 10*3/uL — ABNORMAL LOW (ref 150–400)
Platelets: 75 10*3/uL — ABNORMAL LOW (ref 150–400)
RBC: 2.38 MIL/uL — ABNORMAL LOW (ref 4.22–5.81)
RBC: 2.09 MIL/uL — ABNORMAL LOW (ref 4.22–5.81)
RDW: 13.1 % (ref 11.5–15.5)
RDW: 13.2 % (ref 11.5–15.5)
WBC: 5.6 10*3/uL (ref 4.0–10.5)
WBC: 5.4 10*3/uL (ref 4.0–10.5)
nRBC: 0 % (ref 0.0–0.2)
nRBC: 0 % (ref 0.0–0.2)

## 2022-10-18 LAB — BPAM FFP: ISSUE DATE / TIME: 202405161453

## 2022-10-18 LAB — PREPARE FRESH FROZEN PLASMA

## 2022-10-18 LAB — BPAM PLATELET PHERESIS

## 2022-10-18 LAB — APTT: aPTT: 39 seconds — ABNORMAL HIGH (ref 24–36)

## 2022-10-18 LAB — PLATELET COUNT: Platelets: 94 10*3/uL — ABNORMAL LOW (ref 150–400)

## 2022-10-18 LAB — ABO/RH: ABO/RH(D): A POS

## 2022-10-18 SURGERY — REPLACEMENT, AORTIC VALVE, OPEN
Anesthesia: General | Site: Chest

## 2022-10-18 MED ORDER — SODIUM CHLORIDE 0.9 % IV SOLN: 250.0000 mL | INTRAVENOUS | Status: DC

## 2022-10-18 MED ORDER — DOCUSATE SODIUM 100 MG PO CAPS
200.0000 mg | ORAL_CAPSULE | Freq: Every day | ORAL | Status: DC
  Administered 2022-10-19 – 2022-10-22 (×3): 200 mg via ORAL
  Filled 2022-10-18 (×3): qty 2

## 2022-10-18 MED ORDER — FENTANYL CITRATE (PF) 250 MCG/5ML IJ SOLN
INTRAMUSCULAR | Status: AC
  Filled 2022-10-18: qty 5

## 2022-10-18 MED ORDER — ROCURONIUM BROMIDE 10 MG/ML (PF) SYRINGE
PREFILLED_SYRINGE | INTRAVENOUS | Status: AC
  Filled 2022-10-18: qty 20

## 2022-10-18 MED ORDER — ASPIRIN 325 MG PO TBEC: 325.0000 mg | DELAYED_RELEASE_TABLET | Freq: Every day | ORAL | Status: DC

## 2022-10-18 MED ORDER — POTASSIUM CHLORIDE 10 MEQ/50ML IV SOLN: 10.0000 meq | INTRAVENOUS | Status: AC

## 2022-10-18 MED ORDER — NITROGLYCERIN 0.2 MG/ML ON CALL CATH LAB
INTRAVENOUS | Status: DC | PRN
  Administered 2022-10-18 (×3): 20 ug via INTRAVENOUS

## 2022-10-18 MED ORDER — LACTATED RINGERS IV SOLN: 500.0000 mL | Freq: Once | INTRAVENOUS | Status: DC | PRN

## 2022-10-18 MED ORDER — CHLORHEXIDINE GLUCONATE 0.12 % MT SOLN
15.0000 mL | Freq: Once | OROMUCOSAL | Status: AC
  Administered 2022-10-18 – 2022-10-19 (×2): 15 mL via OROMUCOSAL
  Filled 2022-10-18: qty 15

## 2022-10-18 MED ORDER — THROMBIN 20000 UNITS EX SOLR
OROMUCOSAL | Status: DC | PRN
  Administered 2022-10-18 (×3): 4 mL via TOPICAL

## 2022-10-18 MED ORDER — FAMOTIDINE IN NACL 20-0.9 MG/50ML-% IV SOLN
20.0000 mg | Freq: Two times a day (BID) | INTRAVENOUS | Status: AC
  Administered 2022-10-18 (×2): 20 mg via INTRAVENOUS
  Filled 2022-10-18 (×2): qty 50

## 2022-10-18 MED ORDER — PHENYLEPHRINE 80 MCG/ML (10ML) SYRINGE FOR IV PUSH (FOR BLOOD PRESSURE SUPPORT)
PREFILLED_SYRINGE | INTRAVENOUS | Status: AC
  Filled 2022-10-18: qty 10

## 2022-10-18 MED ORDER — LACTATED RINGERS IV SOLN: INTRAVENOUS | Status: DC

## 2022-10-18 MED ORDER — DEXMEDETOMIDINE HCL IN NACL 400 MCG/100ML IV SOLN
0.0000 ug/kg/h | INTRAVENOUS | Status: DC
  Administered 2022-10-18: 0.7 ug/kg/h via INTRAVENOUS
  Administered 2022-10-19: 0.5 ug/kg/h via INTRAVENOUS
  Filled 2022-10-18 (×2): qty 100

## 2022-10-18 MED ORDER — PLASMA-LYTE A IV SOLN: INTRAVENOUS | Status: DC | PRN

## 2022-10-18 MED ORDER — PROPOFOL 10 MG/ML IV BOLUS
INTRAVENOUS | Status: AC
  Filled 2022-10-18: qty 20

## 2022-10-18 MED ORDER — SODIUM CHLORIDE 0.9% FLUSH
3.0000 mL | Freq: Two times a day (BID) | INTRAVENOUS | Status: DC
  Administered 2022-10-19 – 2022-10-20 (×4): 3 mL via INTRAVENOUS

## 2022-10-18 MED ORDER — CHLORHEXIDINE GLUCONATE 4 % EX SOLN: 30.0000 mL | CUTANEOUS | Status: DC

## 2022-10-18 MED ORDER — PHENYLEPHRINE 80 MCG/ML (10ML) SYRINGE FOR IV PUSH (FOR BLOOD PRESSURE SUPPORT)
PREFILLED_SYRINGE | INTRAVENOUS | Status: DC | PRN
  Administered 2022-10-18 (×4): 80 ug via INTRAVENOUS

## 2022-10-18 MED ORDER — PHENYLEPHRINE HCL-NACL 20-0.9 MG/250ML-% IV SOLN
0.0000 ug/min | INTRAVENOUS | Status: DC
  Administered 2022-10-18: 10 ug/min via INTRAVENOUS

## 2022-10-18 MED ORDER — CHLORHEXIDINE GLUCONATE 0.12 % MT SOLN
15.0000 mL | OROMUCOSAL | Status: AC
  Administered 2022-10-18: 15 mL via OROMUCOSAL

## 2022-10-18 MED ORDER — DEXTROSE 50 % IV SOLN: 0.0000 mL | INTRAVENOUS | Status: DC | PRN

## 2022-10-18 MED ORDER — HEMOSTATIC AGENTS (NO CHARGE) OPTIME
TOPICAL | Status: DC | PRN
  Administered 2022-10-18: 1 via TOPICAL

## 2022-10-18 MED ORDER — LACTATED RINGERS IV SOLN: INTRAVENOUS | Status: DC | PRN

## 2022-10-18 MED ORDER — ROCURONIUM BROMIDE 10 MG/ML (PF) SYRINGE
PREFILLED_SYRINGE | INTRAVENOUS | Status: DC | PRN
  Administered 2022-10-18 (×2): 50 mg via INTRAVENOUS
  Administered 2022-10-18: 70 mg via INTRAVENOUS
  Administered 2022-10-18: 80 mg via INTRAVENOUS

## 2022-10-18 MED ORDER — ALBUMIN HUMAN 5 % IV SOLN: INTRAVENOUS | Status: DC | PRN

## 2022-10-18 MED ORDER — EPHEDRINE 5 MG/ML INJ
INTRAVENOUS | Status: AC
  Filled 2022-10-18: qty 5

## 2022-10-18 MED ORDER — THROMBIN (RECOMBINANT) 20000 UNITS EX SOLR
CUTANEOUS | Status: AC
  Filled 2022-10-18: qty 20000

## 2022-10-18 MED ORDER — HEPARIN SODIUM (PORCINE) 1000 UNIT/ML IJ SOLN
INTRAMUSCULAR | Status: DC | PRN
  Administered 2022-10-18: 30000 [IU] via INTRAVENOUS

## 2022-10-18 MED ORDER — ONDANSETRON HCL 4 MG/2ML IJ SOLN
4.0000 mg | Freq: Four times a day (QID) | INTRAMUSCULAR | Status: DC | PRN
  Administered 2022-10-18 – 2022-10-20 (×4): 4 mg via INTRAVENOUS
  Filled 2022-10-18 (×4): qty 2

## 2022-10-18 MED ORDER — SODIUM BICARBONATE 8.4 % IV SOLN
50.0000 meq | Freq: Once | INTRAVENOUS | Status: AC
  Administered 2022-10-18: 50 meq via INTRAVENOUS

## 2022-10-18 MED ORDER — COLCHICINE 0.6 MG PO TABS: 0.6000 mg | ORAL_TABLET | Freq: Four times a day (QID) | ORAL | Status: DC | PRN

## 2022-10-18 MED ORDER — FENTANYL CITRATE (PF) 250 MCG/5ML IJ SOLN
INTRAMUSCULAR | Status: DC | PRN
  Administered 2022-10-18: 150 ug via INTRAVENOUS
  Administered 2022-10-18 (×2): 250 ug via INTRAVENOUS
  Administered 2022-10-18 (×4): 100 ug via INTRAVENOUS
  Administered 2022-10-18 (×2): 50 ug via INTRAVENOUS
  Administered 2022-10-18: 100 ug via INTRAVENOUS
  Administered 2022-10-18: 250 ug via INTRAVENOUS

## 2022-10-18 MED ORDER — ~~LOC~~ CARDIAC SURGERY, PATIENT & FAMILY EDUCATION
Freq: Once | Status: DC
  Filled 2022-10-18: qty 1

## 2022-10-18 MED ORDER — PROPOFOL 10 MG/ML IV BOLUS
INTRAVENOUS | Status: DC | PRN
  Administered 2022-10-18 (×2): 50 mg via INTRAVENOUS
  Administered 2022-10-18: 20 mg via INTRAVENOUS
  Administered 2022-10-18 (×2): 50 mg via INTRAVENOUS

## 2022-10-18 MED ORDER — BUPROPION HCL ER (XL) 300 MG PO TB24
300.0000 mg | ORAL_TABLET | Freq: Every morning | ORAL | Status: DC
  Administered 2022-10-20 – 2022-10-23 (×4): 300 mg via ORAL
  Filled 2022-10-18 (×5): qty 1

## 2022-10-18 MED ORDER — MIDAZOLAM HCL (PF) 10 MG/2ML IJ SOLN
INTRAMUSCULAR | Status: AC
  Filled 2022-10-18: qty 2

## 2022-10-18 MED ORDER — METOPROLOL TARTRATE 12.5 MG HALF TABLET: 12.5000 mg | ORAL_TABLET | Freq: Two times a day (BID) | ORAL | Status: DC

## 2022-10-18 MED ORDER — ALBUMIN HUMAN 5 % IV SOLN
250.0000 mL | INTRAVENOUS | Status: DC | PRN
  Administered 2022-10-18 – 2022-10-19 (×3): 12.5 g via INTRAVENOUS
  Filled 2022-10-18 (×2): qty 250

## 2022-10-18 MED ORDER — EPHEDRINE SULFATE-NACL 50-0.9 MG/10ML-% IV SOSY
PREFILLED_SYRINGE | INTRAVENOUS | Status: DC | PRN
  Administered 2022-10-18 (×2): 5 mg via INTRAVENOUS
  Administered 2022-10-18 (×2): 10 mg via INTRAVENOUS

## 2022-10-18 MED ORDER — ACETAMINOPHEN 160 MG/5ML PO SOLN
1000.0000 mg | Freq: Four times a day (QID) | ORAL | Status: DC
  Administered 2022-10-19: 1000 mg
  Filled 2022-10-18: qty 40.6

## 2022-10-18 MED ORDER — INSULIN REGULAR(HUMAN) IN NACL 100-0.9 UT/100ML-% IV SOLN: INTRAVENOUS | Status: DC

## 2022-10-18 MED ORDER — CALCIUM CHLORIDE 10 % IV SOLN
INTRAVENOUS | Status: DC | PRN
  Administered 2022-10-18: 100 mg via INTRAVENOUS

## 2022-10-18 MED ORDER — METOPROLOL TARTRATE 12.5 MG HALF TABLET
12.5000 mg | ORAL_TABLET | Freq: Once | ORAL | Status: AC
  Administered 2022-10-18: 12.5 mg via ORAL
  Filled 2022-10-18: qty 1

## 2022-10-18 MED ORDER — ORAL CARE MOUTH RINSE: 15.0000 mL | OROMUCOSAL | Status: DC | PRN

## 2022-10-18 MED ORDER — LIDOCAINE 2% (20 MG/ML) 5 ML SYRINGE
INTRAMUSCULAR | Status: DC | PRN
  Administered 2022-10-18: 80 mg via INTRAVENOUS

## 2022-10-18 MED ORDER — MIDAZOLAM HCL 2 MG/2ML IJ SOLN: 2.0000 mg | INTRAMUSCULAR | Status: DC | PRN

## 2022-10-18 MED ORDER — NITROGLYCERIN IN D5W 200-5 MCG/ML-% IV SOLN
0.0000 ug/min | INTRAVENOUS | Status: DC
  Administered 2022-10-19: 5 ug/min via INTRAVENOUS

## 2022-10-18 MED ORDER — ASPIRIN 81 MG PO CHEW: 324.0000 mg | CHEWABLE_TABLET | Freq: Every day | ORAL | Status: DC

## 2022-10-18 MED ORDER — PANTOPRAZOLE SODIUM 40 MG PO TBEC
40.0000 mg | DELAYED_RELEASE_TABLET | Freq: Every day | ORAL | Status: DC
  Administered 2022-10-20 – 2022-10-23 (×4): 40 mg via ORAL
  Filled 2022-10-18 (×4): qty 1

## 2022-10-18 MED ORDER — ALLOPURINOL 300 MG PO TABS
300.0000 mg | ORAL_TABLET | Freq: Every morning | ORAL | Status: DC
  Administered 2022-10-21 – 2022-10-23 (×3): 300 mg via ORAL
  Filled 2022-10-18 (×5): qty 1

## 2022-10-18 MED ORDER — ACETAMINOPHEN 160 MG/5ML PO SOLN: 650.0000 mg | Freq: Once | ORAL | Status: AC

## 2022-10-18 MED ORDER — MORPHINE SULFATE (PF) 2 MG/ML IV SOLN
1.0000 mg | INTRAVENOUS | Status: DC | PRN
  Administered 2022-10-18: 4 mg via INTRAVENOUS
  Administered 2022-10-18 – 2022-10-19 (×5): 2 mg via INTRAVENOUS
  Filled 2022-10-18 (×7): qty 1

## 2022-10-18 MED ORDER — BISACODYL 5 MG PO TBEC
10.0000 mg | DELAYED_RELEASE_TABLET | Freq: Every day | ORAL | Status: DC
  Administered 2022-10-19 – 2022-10-22 (×3): 10 mg via ORAL
  Filled 2022-10-18 (×3): qty 2

## 2022-10-18 MED ORDER — MIDAZOLAM HCL (PF) 5 MG/ML IJ SOLN
INTRAMUSCULAR | Status: DC | PRN
  Administered 2022-10-18: 1 mg via INTRAVENOUS
  Administered 2022-10-18 (×3): 2 mg via INTRAVENOUS
  Administered 2022-10-18: 3 mg via INTRAVENOUS

## 2022-10-18 MED ORDER — SODIUM CHLORIDE 0.9 % IV SOLN: INTRAVENOUS | Status: DC

## 2022-10-18 MED ORDER — ATROPINE SULFATE 0.4 MG/ML IV SOLN
INTRAVENOUS | Status: AC
  Filled 2022-10-18: qty 1

## 2022-10-18 MED ORDER — CEFAZOLIN SODIUM-DEXTROSE 2-4 GM/100ML-% IV SOLN
2.0000 g | Freq: Three times a day (TID) | INTRAVENOUS | Status: AC
  Administered 2022-10-18 – 2022-10-20 (×6): 2 g via INTRAVENOUS
  Filled 2022-10-18 (×6): qty 100

## 2022-10-18 MED ORDER — TRAMADOL HCL 50 MG PO TABS
50.0000 mg | ORAL_TABLET | ORAL | Status: DC | PRN
  Administered 2022-10-19 – 2022-10-20 (×2): 100 mg via ORAL
  Filled 2022-10-18 (×2): qty 2

## 2022-10-18 MED ORDER — VANCOMYCIN HCL IN DEXTROSE 1-5 GM/200ML-% IV SOLN
1000.0000 mg | Freq: Once | INTRAVENOUS | Status: AC
  Administered 2022-10-18: 1000 mg via INTRAVENOUS
  Filled 2022-10-18: qty 200

## 2022-10-18 MED ORDER — BISACODYL 10 MG RE SUPP: 10.0000 mg | Freq: Every day | RECTAL | Status: DC

## 2022-10-18 MED ORDER — SODIUM CHLORIDE 0.45 % IV SOLN: INTRAVENOUS | Status: DC | PRN

## 2022-10-18 MED ORDER — FLUTICASONE PROPIONATE 50 MCG/ACT NA SUSP
1.0000 | Freq: Every evening | NASAL | Status: DC | PRN
  Filled 2022-10-18: qty 16

## 2022-10-18 MED ORDER — PROTAMINE SULFATE 10 MG/ML IV SOLN
INTRAVENOUS | Status: DC | PRN
  Administered 2022-10-18: 300 mg via INTRAVENOUS

## 2022-10-18 MED ORDER — OXYCODONE HCL 5 MG PO TABS
5.0000 mg | ORAL_TABLET | ORAL | Status: DC | PRN
  Administered 2022-10-19 – 2022-10-22 (×7): 10 mg via ORAL
  Filled 2022-10-18 (×8): qty 2

## 2022-10-18 MED ORDER — MAGNESIUM SULFATE 4 GM/100ML IV SOLN
4.0000 g | Freq: Once | INTRAVENOUS | Status: AC
  Administered 2022-10-18: 4 g via INTRAVENOUS
  Filled 2022-10-18: qty 100

## 2022-10-18 MED ORDER — ACETAMINOPHEN 500 MG PO TABS
1000.0000 mg | ORAL_TABLET | Freq: Four times a day (QID) | ORAL | Status: DC
  Administered 2022-10-19 – 2022-10-23 (×16): 1000 mg via ORAL
  Filled 2022-10-18 (×15): qty 2

## 2022-10-18 MED ORDER — SODIUM CHLORIDE 0.9% IV SOLUTION: Freq: Once | INTRAVENOUS | Status: DC

## 2022-10-18 MED ORDER — ACETAMINOPHEN 650 MG RE SUPP
650.0000 mg | Freq: Once | RECTAL | Status: AC
  Administered 2022-10-18: 650 mg via RECTAL

## 2022-10-18 MED ORDER — CHLORHEXIDINE GLUCONATE CLOTH 2 % EX PADS
6.0000 | MEDICATED_PAD | Freq: Every day | CUTANEOUS | Status: DC
  Administered 2022-10-18 – 2022-10-22 (×5): 6 via TOPICAL

## 2022-10-18 MED ORDER — TAMSULOSIN HCL 0.4 MG PO CAPS
0.8000 mg | ORAL_CAPSULE | Freq: Every evening | ORAL | Status: DC
  Administered 2022-10-19 – 2022-10-22 (×4): 0.8 mg via ORAL
  Filled 2022-10-18 (×4): qty 2

## 2022-10-18 MED ORDER — METOPROLOL TARTRATE 25 MG/10 ML ORAL SUSPENSION
12.5000 mg | Freq: Two times a day (BID) | ORAL | Status: DC
  Filled 2022-10-18: qty 10

## 2022-10-18 MED ORDER — SODIUM CHLORIDE 0.9% FLUSH: 3.0000 mL | INTRAVENOUS | Status: DC | PRN

## 2022-10-18 MED ORDER — ORAL CARE MOUTH RINSE
15.0000 mL | OROMUCOSAL | Status: DC
  Administered 2022-10-18 – 2022-10-19 (×10): 15 mL via OROMUCOSAL

## 2022-10-18 MED ORDER — 0.9 % SODIUM CHLORIDE (POUR BTL) OPTIME
TOPICAL | Status: DC | PRN
  Administered 2022-10-18: 5000 mL

## 2022-10-18 MED ORDER — ROSUVASTATIN CALCIUM 5 MG PO TABS
10.0000 mg | ORAL_TABLET | Freq: Every morning | ORAL | Status: DC
  Administered 2022-10-21 – 2022-10-23 (×3): 10 mg via ORAL
  Filled 2022-10-18 (×5): qty 2

## 2022-10-18 MED ORDER — METOPROLOL TARTRATE 5 MG/5ML IV SOLN: 2.5000 mg | INTRAVENOUS | Status: DC | PRN

## 2022-10-18 MED ORDER — GABAPENTIN 300 MG PO CAPS
600.0000 mg | ORAL_CAPSULE | Freq: Three times a day (TID) | ORAL | Status: DC
  Administered 2022-10-19 – 2022-10-23 (×12): 600 mg via ORAL
  Filled 2022-10-18 (×13): qty 2

## 2022-10-18 SURGICAL SUPPLY — 132 items
ADAPTER CARDIO PERF ANTE/RETRO (ADAPTER) ×2 IMPLANT
ADH SKN CLS APL DERMABOND .7 (GAUZE/BANDAGES/DRESSINGS) ×4
ADPR CRDPLG 7.5 .25D 1 LRG Y (ADAPTER) ×2
ADPR PRFSN 84XANTGRD RTRGD (ADAPTER) ×2
BAG DECANTER FOR FLEXI CONT (MISCELLANEOUS) ×2 IMPLANT
BLADE CLIPPER SURG (BLADE) ×2 IMPLANT
BLADE STERNUM SYSTEM 6 (BLADE) ×2 IMPLANT
BLADE SURG 11 STRL SS (BLADE) IMPLANT
BLADE SURG 15 STRL LF DISP TIS (BLADE) ×2 IMPLANT
BLADE SURG 15 STRL SS (BLADE) ×2
BNDG CMPR 5X4 KNIT ELC UNQ LF (GAUZE/BANDAGES/DRESSINGS) ×4
BNDG CMPR 5X62 HK CLSR LF (GAUZE/BANDAGES/DRESSINGS) ×4
BNDG ELASTIC 4INX 5YD STR LF (GAUZE/BANDAGES/DRESSINGS) IMPLANT
BNDG ELASTIC 4X5.8 VLCR STR LF (GAUZE/BANDAGES/DRESSINGS) ×2 IMPLANT
BNDG ELASTIC 6INX 5YD STR LF (GAUZE/BANDAGES/DRESSINGS) IMPLANT
BNDG ELASTIC 6X5.8 VLCR STR LF (GAUZE/BANDAGES/DRESSINGS) ×2 IMPLANT
BNDG GAUZE DERMACEA FLUFF 4 (GAUZE/BANDAGES/DRESSINGS) ×2 IMPLANT
BNDG GZE DERMACEA 4 6PLY (GAUZE/BANDAGES/DRESSINGS) ×4
CANISTER SUCT 3000ML PPV (MISCELLANEOUS) ×2 IMPLANT
CANNULA ARTERIAL VENT 3/8 20FR (CANNULA) IMPLANT
CANNULA GUNDRY RCSP 15FR (MISCELLANEOUS) ×2 IMPLANT
CANNULA MC2 2 STG 36/46 NON-V (CANNULA) IMPLANT
CANNULA SUMP PERICARDIAL (CANNULA) IMPLANT
CATH HEART VENT LEFT (CATHETERS) ×2 IMPLANT
CATH ROBINSON RED A/P 18FR (CATHETERS) ×6 IMPLANT
CATH THORACIC 28FR (CATHETERS) ×2 IMPLANT
CATH THORACIC 36FR (CATHETERS) ×2 IMPLANT
CATH THORACIC 36FR RT ANG (CATHETERS) ×2 IMPLANT
CLIP TI MEDIUM 24 (CLIP) IMPLANT
CLIP TI WIDE RED SMALL 24 (CLIP) IMPLANT
CNTNR URN SCR LID CUP LEK RST (MISCELLANEOUS) ×2 IMPLANT
CONT SPEC 4OZ STRL OR WHT (MISCELLANEOUS) ×2
CONTAINER PROTECT SURGISLUSH (MISCELLANEOUS) ×4 IMPLANT
COVER SURGICAL LIGHT HANDLE (MISCELLANEOUS) ×2 IMPLANT
DERMABOND ADVANCED .7 DNX12 (GAUZE/BANDAGES/DRESSINGS) IMPLANT
DEVICE SUT CK QUICK LOAD MINI (Prosthesis & Implant Heart) IMPLANT
DRAPE CARDIOVASCULAR INCISE (DRAPES) ×2
DRAPE SRG 135X102X78XABS (DRAPES) ×2 IMPLANT
DRAPE WARM FLUID 44X44 (DRAPES) ×2 IMPLANT
DRSG COVADERM 4X14 (GAUZE/BANDAGES/DRESSINGS) ×2 IMPLANT
ELECT CAUTERY BLADE 6.4 (BLADE) ×2 IMPLANT
ELECT REM PT RETURN 9FT ADLT (ELECTROSURGICAL) ×4
ELECTRODE REM PT RTRN 9FT ADLT (ELECTROSURGICAL) ×4 IMPLANT
FELT TEFLON 1X6 (MISCELLANEOUS) ×4 IMPLANT
GAUZE 4X4 16PLY ~~LOC~~+RFID DBL (SPONGE) ×2 IMPLANT
GAUZE SPONGE 4X4 12PLY STRL (GAUZE/BANDAGES/DRESSINGS) ×4 IMPLANT
GAUZE SPONGE 4X4 12PLY STRL LF (GAUZE/BANDAGES/DRESSINGS) IMPLANT
GLOVE BIO SURGEON STRL SZ 6 (GLOVE) IMPLANT
GLOVE BIO SURGEON STRL SZ 6.5 (GLOVE) IMPLANT
GLOVE BIO SURGEON STRL SZ7 (GLOVE) IMPLANT
GLOVE BIO SURGEON STRL SZ7.5 (GLOVE) IMPLANT
GLOVE BIOGEL PI IND STRL 6 (GLOVE) IMPLANT
GLOVE BIOGEL PI IND STRL 6.5 (GLOVE) IMPLANT
GLOVE BIOGEL PI IND STRL 7.0 (GLOVE) IMPLANT
GLOVE ECLIPSE 7.0 STRL STRAW (GLOVE) ×4 IMPLANT
GLOVE ORTHO TXT STRL SZ7.5 (GLOVE) IMPLANT
GLOVE SURG MICRO LTX SZ7 (GLOVE) ×4 IMPLANT
GOWN STRL REUS W/ TWL LRG LVL3 (GOWN DISPOSABLE) ×8 IMPLANT
GOWN STRL REUS W/ TWL XL LVL3 (GOWN DISPOSABLE) ×2 IMPLANT
GOWN STRL REUS W/TWL LRG LVL3 (GOWN DISPOSABLE) ×12
GOWN STRL REUS W/TWL XL LVL3 (GOWN DISPOSABLE) ×2
HEMOSTAT POWDER SURGIFOAM 1G (HEMOSTASIS) ×6 IMPLANT
HEMOSTAT SURGICEL 2X14 (HEMOSTASIS) ×2 IMPLANT
INSERT FOGARTY 61MM (MISCELLANEOUS) IMPLANT
INSERT FOGARTY XLG (MISCELLANEOUS) IMPLANT
KIT BASIN OR (CUSTOM PROCEDURE TRAY) ×2 IMPLANT
KIT CATH CPB BARTLE (MISCELLANEOUS) ×2 IMPLANT
KIT SUCTION CATH 14FR (SUCTIONS) ×2 IMPLANT
KIT SUT CK MINI COMBO 4X17 (Prosthesis & Implant Heart) IMPLANT
KIT TURNOVER KIT B (KITS) ×2 IMPLANT
KIT VASOVIEW HEMOPRO 2 VH 4000 (KITS) ×2 IMPLANT
KNIFE MICRO-UNI 3.5 30 DEG (BLADE) ×2 IMPLANT
LINE VENT (MISCELLANEOUS) IMPLANT
NS IRRIG 1000ML POUR BTL (IV SOLUTION) ×12 IMPLANT
PACK E OPEN HEART (SUTURE) ×2 IMPLANT
PACK OPEN HEART (CUSTOM PROCEDURE TRAY) ×2 IMPLANT
PAD ARMBOARD 7.5X6 YLW CONV (MISCELLANEOUS) ×4 IMPLANT
PAD ELECT DEFIB RADIOL ZOLL (MISCELLANEOUS) ×2 IMPLANT
PENCIL BUTTON HOLSTER BLD 10FT (ELECTRODE) ×2 IMPLANT
POSITIONER HEAD DONUT 9IN (MISCELLANEOUS) ×2 IMPLANT
PUNCH AORTIC ROTATE 4.0MM (MISCELLANEOUS) IMPLANT
PUNCH AORTIC ROTATE 4.5MM 8IN (MISCELLANEOUS) ×2 IMPLANT
PUNCH AORTIC ROTATE 5MM 8IN (MISCELLANEOUS) IMPLANT
SET MPS 3-ND DEL (MISCELLANEOUS) IMPLANT
SET Y-VENT ADPTR 7.5 MALE LUER (ADAPTER) IMPLANT
SPONGE INTESTINAL PEANUT (DISPOSABLE) IMPLANT
SPONGE T-LAP 18X18 ~~LOC~~+RFID (SPONGE) ×8 IMPLANT
SPONGE T-LAP 4X18 ~~LOC~~+RFID (SPONGE) ×2 IMPLANT
STOPCOCK 4 WAY LG BORE MALE ST (IV SETS) IMPLANT
SUPPORT HEART JANKE-BARRON (MISCELLANEOUS) ×2 IMPLANT
SUT BONE WAX W31G (SUTURE) ×2 IMPLANT
SUT EB EXC GRN/WHT 2-0 V-5 (SUTURE) ×4 IMPLANT
SUT ETHIBOND 2-0 30 1/2 V-5 (SUTURE) IMPLANT
SUT ETHIBOND V-5 VALVE (SUTURE) IMPLANT
SUT MNCRL AB 4-0 PS2 18 (SUTURE) IMPLANT
SUT PROLENE 3 0 SH DA (SUTURE) IMPLANT
SUT PROLENE 3 0 SH1 36 (SUTURE) ×2 IMPLANT
SUT PROLENE 4 0 RB 1 (SUTURE) ×10
SUT PROLENE 4 0 SH DA (SUTURE) IMPLANT
SUT PROLENE 4-0 RB1 .5 CRCL 36 (SUTURE) ×6 IMPLANT
SUT PROLENE 5 0 C 1 36 (SUTURE) IMPLANT
SUT PROLENE 6 0 C 1 30 (SUTURE) IMPLANT
SUT PROLENE 7 0 BV 1 (SUTURE) IMPLANT
SUT PROLENE 7 0 BV1 MDA (SUTURE) ×2 IMPLANT
SUT PROLENE 8 0 BV175 6 (SUTURE) IMPLANT
SUT SILK  1 MH (SUTURE)
SUT SILK 1 MH (SUTURE) IMPLANT
SUT SILK 2 0 SH (SUTURE) IMPLANT
SUT STEEL 6MS V (SUTURE) IMPLANT
SUT STEEL STERNAL CCS#1 18IN (SUTURE) IMPLANT
SUT STEEL SZ 6 DBL 3X14 BALL (SUTURE) IMPLANT
SUT VIC AB 1 CTX 36 (SUTURE) ×4
SUT VIC AB 1 CTX36XBRD ANBCTR (SUTURE) ×4 IMPLANT
SUT VIC AB 2-0 CT1 27 (SUTURE) ×6
SUT VIC AB 2-0 CT1 TAPERPNT 27 (SUTURE) IMPLANT
SUT VIC AB 2-0 CTX 27 (SUTURE) IMPLANT
SUT VIC AB 3-0 SH 27 (SUTURE)
SUT VIC AB 3-0 SH 27X BRD (SUTURE) IMPLANT
SUT VIC AB 3-0 X1 27 (SUTURE) IMPLANT
SUT VICRYL 4-0 PS2 18IN ABS (SUTURE) IMPLANT
SYSTEM SAHARA CHEST DRAIN ATS (WOUND CARE) ×2 IMPLANT
TAPE CLOTH SURG 4X10 WHT LF (GAUZE/BANDAGES/DRESSINGS) IMPLANT
TAPE PAPER 2X10 WHT MICROPORE (GAUZE/BANDAGES/DRESSINGS) IMPLANT
TOWEL GREEN STERILE (TOWEL DISPOSABLE) ×2 IMPLANT
TOWEL GREEN STERILE FF (TOWEL DISPOSABLE) ×2 IMPLANT
TRAY FOLEY SLVR 16FR TEMP STAT (SET/KITS/TRAYS/PACK) ×2 IMPLANT
TUBING ART PRESS 48 MALE/FEM (TUBING) IMPLANT
TUBING LAP HI FLOW INSUFFLATIO (TUBING) ×2 IMPLANT
UNDERPAD 30X36 HEAVY ABSORB (UNDERPADS AND DIAPERS) ×2 IMPLANT
VALVE AORTIC SZ27 INSP/RESIL (Valve) IMPLANT
VENT LEFT HEART 12002 (CATHETERS) ×2
WATER STERILE IRR 1000ML POUR (IV SOLUTION) ×4 IMPLANT

## 2022-10-18 NOTE — Interval H&P Note (Signed)
History and Physical Interval Note:  10/18/2022 6:27 AM  Theodore Patton  has presented today for surgery, with the diagnosis of CAD AI.  The various methods of treatment have been discussed with the patient and family. After consideration of risks, benefits and other options for treatment, the patient has consented to  Procedure(s): AORTIC VALVE REPLACEMENT (AVR) (N/A) CORONARY ARTERY BYPASS GRAFTING (CABG) (N/A) TRANSESOPHAGEAL ECHOCARDIOGRAM (N/A) as a surgical intervention.  The patient's history has been reviewed, patient examined, no change in status, stable for surgery.  I have reviewed the patient's chart and labs.  Questions were answered to the patient's satisfaction.     Alleen Borne

## 2022-10-18 NOTE — Op Note (Signed)
CARDIOVASCULAR SURGERY OPERATIVE NOTE  10/18/2022  Surgeon:  Alleen Borne, MD  First Assistant: Aloha Gell,  PA-C and Gershon Crane, PA-C:   An experienced assistant was required given the complexity of this surgery and the standard of surgical care. The assistant was needed for endoscopic vein harvest, exposure, dissection, suctioning, retraction of delicate tissues and sutures, instrument exchange and for overall help during this procedure.   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   Coronary artery bypass grafting x 1  Left internal mammary artery graft to the LAD    4.   Endoscopic vein harvest from the left leg 5.   Aortic valve replacement using a 27 mm Edwards INSPIRIS RESILIA pericardial valve   Anesthesia:  General Endotracheal   Clinical History/Surgical Indication:  This 77 year old gentleman has stage D, severe, symptomatic aortic insufficiency with NYHA class II symptoms of exertional fatigue and shortness of breath consistent with chronic diastolic congestive heart failure.  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  The etiology of his aortic insufficiency is likely calcification of the right coronary leaflet with prolapse and lack of coaptation.  I agree that aortic valve replacement is indicated in this patient for relief of his symptoms and to prevent progressive left ventricular dysfunction.  I think he would be best treated with open surgical aortic valve replacement using a bioprosthetic valve and coronary bypass to the LAD, obtuse marginal, and distal right coronary artery.  I reviewed the echo and catheterization images with him and his wife and answered their questions. I discussed the operative  procedure with the patient and his wife including alternatives, benefits and risks; including but not limited to bleeding, blood transfusion, infection, stroke, myocardial infarction, graft failure, heart block requiring a permanent pacemaker, organ dysfunction, and death.  Ezra Sites understands and agrees to proceed.      Preparation:  The patient was seen in the preoperative holding area and the correct patient, correct operation were confirmed with the patient after reviewing the medical record and catheterization. The consent was signed by me. Preoperative antibiotics were given. A pulmonary arterial line and radial arterial line were placed by the anesthesia team. The patient was taken back to the operating room and positioned supine on the operating room table. After being placed under general endotracheal anesthesia by the anesthesia team a foley catheter was placed. The neck, chest, abdomen, and both legs were prepped with betadine soap and solution and draped in the usual sterile manner. A surgical time-out was taken and the correct patient and operative procedure were confirmed with the nursing and anesthesia staff.   Cardiopulmonary Bypass:  A median sternotomy was performed. The pericardium was opened in the midline. Right ventricular function appeared normal. The ascending aorta was of normal size and had no palpable plaque. There were no contraindications to aortic cannulation or cross-clamping. The patient was fully systemically heparinized and the ACT was maintained > 400 sec. The proximal aortic arch was cannulated with a 20 F aortic cannula for arterial inflow. Venous cannulation was performed via the right atrial appendage using a two-staged venous cannula. An antegrade cardioplegia/vent cannula was inserted into the mid-ascending aorta. A left ventricular vent was placed via the right superior pulmonary vein. A retrograde cardioplegia cannula was placed in the coronary sinus via the  right atrium. Aortic occlusion was performed with a single cross-clamp. Systemic  cooling to 32 degrees Centigrade and topical cooling of the heart with iced saline were used. Cold KBC retrograde cardioplegia was used to induce diastolic arrest and was then given at about 60 minute intervals throughout the period of arrest to maintain myocardial temperature at or below 10 degrees centigrade. A temperature probe was inserted into the interventricular septum and an insulating pad was placed in the pericardium.   Left internal mammary artery harvest:  The left side of the sternum was retracted using the Rultract retractor. The left internal mammary artery was harvested as a pedicle graft. All side branches were clipped. It was a large-sized vessel of good quality with excellent blood flow. It was ligated distally and divided. It was sprayed with topical papaverine solution to prevent vasospasm.   Endoscopic vein harvest:  The right greater saphenous vein was examined medial to the right knee and was too small to use.   The left greater saphenous vein was harvested endoscopically through a 2 cm incision medial to the left knee. It was harvested from the upper thigh to below the knee. It was a small-sized vein of good quality but really too small to bypass the large OM or RCA which are large vessels with significant but non-obstructive stenoses. I was concerned that the bypass would not remain patent. Therefore I decided to only graft the LAD with the LIMA.   Coronary arteries:  The coronary arteries were examined.  LAD:  Large vessel with no distal disease lying just below the epicardial fat. LCX:  Large OM branches with segmental distal disease. RCA:  Large vessel with segmental mid vessel disease.    Grafts:  LIMA to the LAD: 2.5 mm. It was sewn end to side using 8-0 prolene continuous suture.   Aortic Valve Replacement:   A transverse aortotomy was performed 1 cm above the take-off of  the right coronary artery. The native valve was tricuspid with calcified leaflets and minimal annular calcification. There was significant fenestration of the commissures.  The ostia of the coronary arteries were in normal position and were not obstructed. The native valve leaflets were excised and the annulus was decalcified with rongeurs. Care was taken to remove all particulate debris. The left ventricle was directly inspected for debris and then irrigated with ice saline solution. The annulus was sized and a size 27 mm Edwards INSPIRIS RESILIA pericardial valve was chosen. The model number was [id number] and the serial number was [id number].  While the valve was being prepared 2-0 Ethibond pledgeted horizontal mattress sutures were placed around the annulus with the pledgets in a sub-annular position. The sutures were placed through the sewing ring and the valve lowered into place. The sutures were tied using CorKnots. The valve seated nicely and the coronary ostia were not obstructed. The prosthetic valve leaflets moved normally and there was no sub-valvular obstruction. The aortotomy was closed using 4-0 Prolene suture in 2 layers with felt strips to reinforce the closure.    Completion:  The patient was rewarmed to 37 degrees Centigrade. The clamp was removed from the LIMA pedicle and there was rapid warming of the septum and return of ventricular fibrillation. The crossclamp was removed with a time of 90 minutes. There was spontaneous return of sinus rhythm. The distal and proximal anastomoses were checked for hemostasis. The position of the grafts was satisfactory. Two temporary epicardial pacing wires were placed on the right atrium and two on the right ventricle. The patient was weaned from CPB without difficulty on  no inotropes. CPB time was 115 minutes. Cardiac output was 5 LPM. TEE showed normal prosthetic aortic valve function with no paravalvular leak. LV systolic function was normal. Heparin was  fully reversed with protamine and the aortic and venous cannulas removed. Hemostasis was achieved. Mediastinal and left pleural drainage tubes were placed. The sternum was closed with #6 stainless steel wires. The fascia was closed with continuous # 1 vicryl suture. The subcutaneous tissue was closed with 2-0 vicryl continuous suture. The skin was closed with 3-0 vicryl subcuticular suture. All sponge, needle, and instrument counts were reported correct at the end of the case. Dry sterile dressings were placed over the incisions and around the chest tubes which were connected to pleurevac suction. The patient was then transported to the surgical intensive care unit in stable condition.

## 2022-10-18 NOTE — Anesthesia Procedure Notes (Addendum)
Central Venous Catheter Insertion Performed by: Marcene Duos, MD, anesthesiologist Start/End5/16/2024 6:55 AM, 10/18/2022 7:10 AM Patient location: Pre-op. Preanesthetic checklist: patient identified, IV checked, site marked, risks and benefits discussed, surgical consent, monitors and equipment checked, pre-op evaluation, timeout performed and anesthesia consent Position: Trendelenburg Lidocaine 1% used for infiltration and patient sedated Hand hygiene performed , maximum sterile barriers used  and Seldinger technique used Catheter size: 9 Fr Total catheter length 10. Central line was placed.MAC introducer Swan type:thermodilution PA Cath depth:50 Procedure performed using ultrasound guided technique. Ultrasound Notes:anatomy identified, needle tip was noted to be adjacent to the nerve/plexus identified, no ultrasound evidence of intravascular and/or intraneural injection and image(s) printed for medical record Attempts: 1 Following insertion, line sutured, dressing applied and Biopatch. Post procedure assessment: blood return through all ports, free fluid flow and no air  Patient tolerated the procedure well with no immediate complications.

## 2022-10-18 NOTE — Anesthesia Preprocedure Evaluation (Signed)
Anesthesia Evaluation  Patient identified by MRN, date of birth, ID band Patient awake    Reviewed: Allergy & Precautions, NPO status , Patient's Chart, lab work & pertinent test results  History of Anesthesia Complications (+) PONV and history of anesthetic complications  Airway Mallampati: II  TM Distance: >3 FB Neck ROM: Full    Dental  (+) Dental Advisory Given   Pulmonary former smoker   breath sounds clear to auscultation       Cardiovascular + CAD  + Valvular Problems/Murmurs AI  Rhythm:Regular Rate:Normal     Neuro/Psych negative neurological ROS     GI/Hepatic Neg liver ROS,GERD  Medicated,,  Endo/Other  negative endocrine ROS    Renal/GU negative Renal ROS     Musculoskeletal  (+) Arthritis ,    Abdominal   Peds  Hematology negative hematology ROS (+)   Anesthesia Other Findings   Reproductive/Obstetrics                              Lab Results  Component Value Date   WBC 4.6 10/16/2022   HGB 12.6 (L) 10/16/2022   HCT 37.4 (L) 10/16/2022   MCV 100.5 (H) 10/16/2022   PLT 112 (L) 10/16/2022   Lab Results  Component Value Date   CREATININE 1.18 10/16/2022   BUN 23 10/16/2022   NA 137 10/16/2022   K 4.5 10/16/2022   CL 105 10/16/2022   CO2 20 (L) 10/16/2022    Anesthesia Physical Anesthesia Plan  ASA: 4  Anesthesia Plan: General   Post-op Pain Management:    Induction: Intravenous  PONV Risk Score and Plan: 3 and Dexamethasone and Ondansetron  Airway Management Planned: Oral ETT  Additional Equipment: Arterial line, CVP, PA Cath, TEE and Ultrasound Guidance Line Placement  Intra-op Plan:   Post-operative Plan: Extubation in OR  Informed Consent: I have reviewed the patients History and Physical, chart, labs and discussed the procedure including the risks, benefits and alternatives for the proposed anesthesia with the patient or authorized  representative who has indicated his/her understanding and acceptance.     Dental advisory given  Plan Discussed with: CRNA  Anesthesia Plan Comments:          Anesthesia Quick Evaluation

## 2022-10-18 NOTE — Hospital Course (Addendum)
HPI: The patient is a 77 year old gentleman with a history of hyperlipidemia, aortic insufficiency, and a mildly dilated aortic root who was referred for consideration of aortic valve replacement surgery.  He had a 2D echocardiogram in August 2023 showing a trileaflet aortic valve with restricted motion of the right coronary cusp with moderate to severe regurgitation.  The AI pressure half-time was 318 ms.  There was mild dilation of the aortic root at 4.3 cm with the ascending aorta measured 3.8 cm.  Left ventricular ejection fraction was 60 to 65% with mild concentric LVH.  LV diastolic diameter was 5.9 cm with a systolic diameter 3.9 cm.  He had a repeat echo in February 2024 showing a posteriorly directed eccentric AI jet due to restriction of the right coronary cusp with severe aortic insufficiency.  Pressure half-time was measured at 460 ms.  Left ventricular diastolic diameter was 5.3 cm with an ejection fraction of 60 to 65%.  He subsequently underwent TEE on 08/24/2022 showing an eccentric AI jet with a pressure half-time of 258 ms.  There was holodiastolic reversal flow in the descending aorta.  He underwent cardiac catheterization on 09/28/2022 showing 70% proximal RCA stenosis.  The mid left circumflex had about 60% stenosis.  The LAD had 2 areas of 50% mid vessel stenosis.  Right and left heart filling pressures were normal.   The patient lives with his wife.  He reports exertional fatigue but does what he needs to do.  He has had some shortness of breath with exertion as well as occasionally at rest and with lying flat.  He denies any peripheral edema.  Dr. Laneta Simmers reviewed the patient's diagnostic studies and determined surgical intervention would benefit this patient. He reviewed the treatment options as well as the risks and benefits of surgery with the patient. Mr. Theodore Patton was agreeable to proceed with surgery.  Hospital Course: Mr. Theodore Patton presented to [facility] and was brought to the  operating room on 10/18/22. He underwent CABG x 1 utilizing LIMA to LAD, aortic valve replacement utilizing a 27mm Inspiris Aortic Valve as well as endoscopic harvest of the right greater saphenous vein which was too small to be used as a graft. He remained intubated overnight due to apnea but was extubated the following morning. His drips were weaned as hemodynamics tolerated. He had expected acute postoperative blood loss anemia, he was transfused with 2 units of PRBCs and started on an iron supplement. His Aspirin was decreased to 81mg  due to thrombocytopenia.

## 2022-10-18 NOTE — Transfer of Care (Signed)
Immediate Anesthesia Transfer of Care Note  Patient: Theodore Patton  Procedure(s) Performed: AORTIC VALVE REPLACEMENT (AVR) USING INSPIRIS AORTIC VALVE SIZE (Chest) CORONARY ARTERY BYPASS GRAFTING (CABG) X 1 USING LEFT INTERNAL MAMMARY  ARTERY AND ENDOSCOPICALLY HARVESTED LEFT GREATER SAPHENOUS VEIN (Chest) TRANSESOPHAGEAL ECHOCARDIOGRAM  Patient Location: SICU  Anesthesia Type:General  Level of Consciousness: sedated and Patient remains intubated per anesthesia plan  Airway & Oxygen Therapy: Patient remains intubated per anesthesia plan and Patient placed on Ventilator (see vital sign flow sheet for setting)  Post-op Assessment: Report given to RN and Post -op Vital signs reviewed and stable  Post vital signs: Reviewed and stable  Last Vitals:  Vitals Value Taken Time  BP 84/64 10/18/22 1332  Temp 35.3 C 10/18/22 1339  Pulse 80 10/18/22 1339  Resp 12 10/18/22 1339  SpO2 98 % 10/18/22 1339  Vitals shown include unvalidated device data.  Last Pain:  Vitals:   10/18/22 0559  PainSc: 0-No pain      Patients Stated Pain Goal: 0 (10/18/22 0559)  Complications: No notable events documented.

## 2022-10-18 NOTE — Anesthesia Procedure Notes (Signed)
Procedure Name: Intubation Date/Time: 10/18/2022 7:54 AM  Performed by: Adria Dill, CRNAPre-anesthesia Checklist: Patient identified, Emergency Drugs available, Suction available and Patient being monitored Patient Re-evaluated:Patient Re-evaluated prior to induction Oxygen Delivery Method: Circle system utilized Preoxygenation: Pre-oxygenation with 100% oxygen Induction Type: IV induction Ventilation: Two handed mask ventilation required Laryngoscope Size: Mac and 4 Grade View: Grade II Tube type: Oral Tube size: 8.0 mm Number of attempts: 1 Airway Equipment and Method: Stylet and Oral airway Placement Confirmation: ETT inserted through vocal cords under direct vision, positive ETCO2 and breath sounds checked- equal and bilateral Secured at: 23 cm Tube secured with: Tape Dental Injury: Teeth and Oropharynx as per pre-operative assessment

## 2022-10-18 NOTE — Anesthesia Procedure Notes (Signed)
Central Venous Catheter Insertion Performed by: Marcene Duos, MD, anesthesiologist Start/End5/16/2024 6:55 AM, 10/18/2022 7:10 AM Patient location: Pre-op. Preanesthetic checklist: patient identified, IV checked, site marked, risks and benefits discussed, surgical consent, monitors and equipment checked, pre-op evaluation, timeout performed and anesthesia consent Hand hygiene performed  and maximum sterile barriers used  PA cath was placed.Swan type:thermodilution PA Cath depth:50 Procedure performed without using ultrasound guided technique. Attempts: 1 Patient tolerated the procedure well with no immediate complications.

## 2022-10-18 NOTE — Brief Op Note (Signed)
10/18/2022  4:50 PM  PATIENT:  Theodore Patton  77 y.o. male  PRE-OPERATIVE DIAGNOSIS:  CAD, AI  POST-OPERATIVE DIAGNOSIS:  CAD, AI  PROCEDURE:   AORTIC VALVE REPLACEMENT (AVR) USING INSPIRIS AORTIC VALVE SIZE  CORONARY ARTERY BYPASS GRAFTING (CABG) X 1 USING LEFT INTERNAL MAMMARY ARTERY  ENDOSCOPICALLY HARVESTED LEFT GREATER SAPHENOUS VEIN  TRANSESOPHAGEAL ECHOCARDIOGRAM  -LIMA to LAD Vein harvest time: Vein prep time:  SURGEON:  Surgeon(s) and Role:    * Alleen Borne, MD - Primary  PHYSICIAN ASSISTANT: Aloha Gell PA-C, Gershon Crane PA-C  ASSISTANTS: Benay Spice RNFA   ANESTHESIA:   general  EBL:  400cc  BLOOD ADMINISTERED:none  DRAINS:  Mediastinal and pleural tubes    LOCAL MEDICATIONS USED:  NONE  SPECIMEN:  Source of Specimen:  Aortic valve leaflets  DISPOSITION OF SPECIMEN:  PATHOLOGY  COUNTS CORRECT:  YES  DICTATION: .Dragon Dictation  PLAN OF CARE: Admit to inpatient   PATIENT DISPOSITION:  ICU - intubated and hemodynamically stable.   Delay start of Pharmacological VTE agent (>24hrs) due to surgical blood loss or risk of bleeding: yes

## 2022-10-18 NOTE — Anesthesia Postprocedure Evaluation (Signed)
Anesthesia Post Note  Patient: SAMBA MADEIRA  Procedure(s) Performed: AORTIC VALVE REPLACEMENT (AVR) USING INSPIRIS AORTIC VALVE SIZE (Chest) CORONARY ARTERY BYPASS GRAFTING (CABG) X 1 USING LEFT INTERNAL MAMMARY  ARTERY AND ENDOSCOPICALLY HARVESTED LEFT GREATER SAPHENOUS VEIN (Chest) TRANSESOPHAGEAL ECHOCARDIOGRAM     Patient location during evaluation: SICU Anesthesia Type: General Level of consciousness: sedated Pain management: pain level controlled Vital Signs Assessment: post-procedure vital signs reviewed and stable Respiratory status: patient remains intubated per anesthesia plan Cardiovascular status: stable Postop Assessment: no apparent nausea or vomiting Anesthetic complications: no  No notable events documented.  Last Vitals:  Vitals:   10/18/22 1530 10/18/22 1545  BP:    Pulse: 84 84  Resp: 12 12  Temp: (!) 35.8 C (!) 35.9 C  SpO2: 100% 100%    Last Pain:  Vitals:   10/18/22 1458  TempSrc: Core (Comment)  PainSc:                  Kennieth Rad

## 2022-10-18 NOTE — Progress Notes (Signed)
     [address]       [name] [address]             [phone]       EVENING ROUNDS  POD #0 AVR/CABG Doing well Had FFP for some blood loss Doing well Starting to wean sedation

## 2022-10-18 NOTE — Anesthesia Procedure Notes (Signed)
Arterial Line Insertion Start/End5/16/2024 7:05 AM, 10/18/2022 7:10 AM Performed by: CRNA  Patient location: Pre-op. Preanesthetic checklist: patient identified, IV checked, site marked, risks and benefits discussed, surgical consent, monitors and equipment checked, pre-op evaluation, timeout performed and anesthesia consent Lidocaine 1% used for infiltration Left, radial was placed Catheter size: 20 G Hand hygiene performed , maximum sterile barriers used  and Seldinger technique used Allen's test indicative of satisfactory collateral circulation Attempts: 1 Procedure performed without using ultrasound guided technique. Following insertion, dressing applied and Biopatch. Post procedure assessment: normal  Patient tolerated the procedure well with no immediate complications.

## 2022-10-19 ENCOUNTER — Encounter (HOSPITAL_COMMUNITY): Payer: Self-pay | Admitting: Surgery

## 2022-10-19 ENCOUNTER — Other Ambulatory Visit: Payer: Self-pay | Admitting: Cardiology

## 2022-10-19 ENCOUNTER — Inpatient Hospital Stay (HOSPITAL_COMMUNITY): Payer: Medicare Other

## 2022-10-19 DIAGNOSIS — Z951 Presence of aortocoronary bypass graft: Secondary | ICD-10-CM

## 2022-10-19 DIAGNOSIS — Z952 Presence of prosthetic heart valve: Secondary | ICD-10-CM

## 2022-10-19 LAB — ECHO INTRAOPERATIVE TEE
AV Mean grad: 8 mmHg
AV Peak grad: 13.1 mmHg
Ao pk vel: 1.81 m/s
Height: 73 in
P 1/2 time: 391 msec
Weight: 3040 oz

## 2022-10-19 LAB — GLUCOSE, CAPILLARY
Glucose-Capillary: 115 mg/dL — ABNORMAL HIGH (ref 70–99)
Glucose-Capillary: 116 mg/dL — ABNORMAL HIGH (ref 70–99)
Glucose-Capillary: 125 mg/dL — ABNORMAL HIGH (ref 70–99)
Glucose-Capillary: 126 mg/dL — ABNORMAL HIGH (ref 70–99)
Glucose-Capillary: 130 mg/dL — ABNORMAL HIGH (ref 70–99)
Glucose-Capillary: 130 mg/dL — ABNORMAL HIGH (ref 70–99)
Glucose-Capillary: 131 mg/dL — ABNORMAL HIGH (ref 70–99)
Glucose-Capillary: 135 mg/dL — ABNORMAL HIGH (ref 70–99)
Glucose-Capillary: 147 mg/dL — ABNORMAL HIGH (ref 70–99)
Glucose-Capillary: 149 mg/dL — ABNORMAL HIGH (ref 70–99)
Glucose-Capillary: 151 mg/dL — ABNORMAL HIGH (ref 70–99)

## 2022-10-19 LAB — CBC
HCT: 19.9 % — ABNORMAL LOW (ref 39.0–52.0)
HCT: 26.7 % — ABNORMAL LOW (ref 39.0–52.0)
Hemoglobin: 6.7 g/dL — CL (ref 13.0–17.0)
Hemoglobin: 9.1 g/dL — ABNORMAL LOW (ref 13.0–17.0)
MCH: 33.2 pg (ref 26.0–34.0)
MCH: 32.4 pg (ref 26.0–34.0)
MCHC: 33.7 g/dL (ref 30.0–36.0)
MCHC: 34.1 g/dL (ref 30.0–36.0)
MCV: 98.5 fL (ref 80.0–100.0)
MCV: 95 fL (ref 80.0–100.0)
Platelets: 79 10*3/uL — ABNORMAL LOW (ref 150–400)
Platelets: 84 10*3/uL — ABNORMAL LOW (ref 150–400)
RBC: 2.02 MIL/uL — ABNORMAL LOW (ref 4.22–5.81)
RBC: 2.81 MIL/uL — ABNORMAL LOW (ref 4.22–5.81)
RDW: 13.1 % (ref 11.5–15.5)
RDW: 16.1 % — ABNORMAL HIGH (ref 11.5–15.5)
WBC: 8.1 10*3/uL (ref 4.0–10.5)
WBC: 11.4 10*3/uL — ABNORMAL HIGH (ref 4.0–10.5)
nRBC: 0 % (ref 0.0–0.2)
nRBC: 0 % (ref 0.0–0.2)

## 2022-10-19 LAB — BPAM FFP
Blood Product Expiration Date: 202405202359
Blood Product Expiration Date: 202405202359
ISSUE DATE / TIME: 202405161552
Unit Type and Rh: 6200
Unit Type and Rh: 6200

## 2022-10-19 LAB — PREPARE PLATELET PHERESIS: Unit division: 0

## 2022-10-19 LAB — BASIC METABOLIC PANEL
Anion gap: 9 (ref 5–15)
Anion gap: 12 (ref 5–15)
BUN: 17 mg/dL (ref 8–23)
BUN: 20 mg/dL (ref 8–23)
CO2: 23 mmol/L (ref 22–32)
CO2: 24 mmol/L (ref 22–32)
Calcium: 8.5 mg/dL — ABNORMAL LOW (ref 8.9–10.3)
Calcium: 8.8 mg/dL — ABNORMAL LOW (ref 8.9–10.3)
Chloride: 104 mmol/L (ref 98–111)
Chloride: 99 mmol/L (ref 98–111)
Creatinine, Ser: 1.2 mg/dL (ref 0.61–1.24)
Creatinine, Ser: 1.44 mg/dL — ABNORMAL HIGH (ref 0.61–1.24)
GFR, Estimated: >60 mL/min (ref >60)
GFR, Estimated: 51 mL/min — ABNORMAL LOW (ref >60)
Glucose, Bld: 127 mg/dL — ABNORMAL HIGH (ref 70–99)
Glucose, Bld: 183 mg/dL — ABNORMAL HIGH (ref 70–99)
Potassium: 4.6 mmol/L (ref 3.5–5.1)
Potassium: 4 mmol/L (ref 3.5–5.1)
Sodium: 136 mmol/L (ref 135–145)
Sodium: 135 mmol/L (ref 135–145)

## 2022-10-19 LAB — PREPARE RBC (CROSSMATCH)

## 2022-10-19 LAB — SURGICAL PATHOLOGY

## 2022-10-19 LAB — TYPE AND SCREEN
ABO/RH(D): A POS
Unit division: 0
Unit division: 0

## 2022-10-19 LAB — POCT I-STAT 7, (LYTES, BLD GAS, ICA,H+H)
Acid-base deficit: 1 mmol/L (ref 0.0–2.0)
Bicarbonate: 23.2 mmol/L (ref 20.0–28.0)
Calcium, Ion: 1.2 mmol/L (ref 1.15–1.40)
HCT: 19 % — ABNORMAL LOW (ref 39.0–52.0)
Hemoglobin: 6.5 g/dL — CL (ref 13.0–17.0)
O2 Saturation: 99 %
Patient temperature: 37.9
Potassium: 4.7 mmol/L (ref 3.5–5.1)
Sodium: 139 mmol/L (ref 135–145)
TCO2: 24 mmol/L (ref 22–32)
pCO2 arterial: 38.7 mmHg (ref 32–48)
pH, Arterial: 7.39 (ref 7.35–7.45)
pO2, Arterial: 152 mmHg — ABNORMAL HIGH (ref 83–108)

## 2022-10-19 LAB — BPAM RBC
Unit Type and Rh: 6200
Unit Type and Rh: 6200

## 2022-10-19 LAB — BPAM PLATELET PHERESIS
Blood Product Expiration Date: 202405192359
ISSUE DATE / TIME: 202405161156
Unit Type and Rh: 6200

## 2022-10-19 LAB — MAGNESIUM
Magnesium: 2.3 mg/dL (ref 1.7–2.4)
Magnesium: 2.1 mg/dL (ref 1.7–2.4)

## 2022-10-19 LAB — PREPARE FRESH FROZEN PLASMA: Unit division: 0

## 2022-10-19 MED ORDER — INSULIN ASPART 100 UNIT/ML IJ SOLN
0.0000 [IU] | INTRAMUSCULAR | Status: DC
  Administered 2022-10-19 – 2022-10-20 (×5): 2 [IU] via SUBCUTANEOUS

## 2022-10-19 MED ORDER — FE FUM-VIT C-VIT B12-FA 460-60-0.01-1 MG PO CAPS
1.0000 | ORAL_CAPSULE | Freq: Every day | ORAL | Status: DC
  Administered 2022-10-19 – 2022-10-23 (×5): 1 via ORAL
  Filled 2022-10-19 (×5): qty 1

## 2022-10-19 MED ORDER — ASPIRIN 81 MG PO TBEC
81.0000 mg | DELAYED_RELEASE_TABLET | Freq: Every day | ORAL | Status: DC
  Administered 2022-10-19 – 2022-10-23 (×4): 81 mg via ORAL
  Filled 2022-10-19 (×5): qty 1

## 2022-10-19 MED ORDER — FUROSEMIDE 10 MG/ML IJ SOLN
40.0000 mg | Freq: Two times a day (BID) | INTRAMUSCULAR | Status: AC
  Administered 2022-10-19 (×2): 40 mg via INTRAVENOUS
  Filled 2022-10-19 (×2): qty 4

## 2022-10-19 MED ORDER — ASPIRIN 81 MG PO CHEW: 81.0000 mg | CHEWABLE_TABLET | Freq: Every day | ORAL | Status: DC

## 2022-10-19 MED ORDER — INSULIN ASPART 100 UNIT/ML IJ SOLN: 0.0000 [IU] | INTRAMUSCULAR | Status: DC

## 2022-10-19 MED ORDER — SODIUM CHLORIDE 0.9% IV SOLUTION: Freq: Once | INTRAVENOUS | Status: AC

## 2022-10-19 MED ORDER — SODIUM CHLORIDE 0.9% IV SOLUTION: Freq: Once | INTRAVENOUS | Status: DC

## 2022-10-19 MED FILL — Thrombin (Recombinant) For Soln 20000 Unit: CUTANEOUS | Qty: 1 | Status: AC

## 2022-10-19 NOTE — Discharge Instructions (Signed)

## 2022-10-19 NOTE — Progress Notes (Signed)
Pt achieved a NIF of -40 AND VC of 1.43, with good pt effort on all attempts. RN at bedside, RT will monitor as need.

## 2022-10-19 NOTE — Discharge Summary (Signed)
[address]       [place] [address]             [phone]    Physician Discharge Summary  Patient ID: Theodore Patton MRN: [id number] DOB/AGE: May 20, 1946 77 y.o.  Admit date: 10/18/2022 Discharge date: 10/23/2022  Admission Diagnoses:  Patient Active Problem List   Diagnosis Date Noted   CAD (coronary artery disease) 10/18/2022   S/P reverse total shoulder arthroplasty, left 03/06/2022   Coronary artery calcification seen on CAT scan 02/27/2021   Primary localized osteoarthritis of right knee 04/14/2019   Status post right partial knee replacement 04/14/2019   Aortic regurgitation 04/11/2018   Aortic root dilatation (HCC) 04/11/2018   Mitral regurgitation 04/11/2018   Hyperlipemia    Primary localized osteoarthrosis of right shoulder 07/24/2016   S/P shoulder replacement, right 07/24/2016     Discharge Diagnoses:  Patient Active Problem List   Diagnosis Date Noted   S/P CABG x 1 10/19/2022   CAD (coronary artery disease) 10/18/2022   S/P AVR (aortic valve replacement) 10/18/2022   S/P reverse total shoulder arthroplasty, left 03/06/2022   Coronary artery calcification seen on CAT scan 02/27/2021   Primary localized osteoarthritis of right knee 04/14/2019   Status post right partial knee replacement 04/14/2019   Aortic regurgitation 04/11/2018   Aortic root dilatation (HCC) 04/11/2018   Mitral regurgitation 04/11/2018   Hyperlipemia    Primary localized osteoarthrosis of right shoulder 07/24/2016   S/P shoulder replacement, right 07/24/2016     Discharged Condition: stable  HPI: The patient is a 77 year old gentleman with a history of hyperlipidemia, aortic insufficiency, and a mildly dilated aortic root who was referred for consideration of aortic valve replacement surgery.  He had a 2D echocardiogram in August 2023 showing a trileaflet aortic valve with restricted motion of the right coronary cusp with moderate to severe regurgitation.   The AI pressure half-time was 318 ms.  There was mild dilation of the aortic root at 4.3 cm with the ascending aorta measured 3.8 cm.  Left ventricular ejection fraction was 60 to 65% with mild concentric LVH.  LV diastolic diameter was 5.9 cm with a systolic diameter 3.9 cm.  He had a repeat echo in February 2024 showing a posteriorly directed eccentric AI jet due to restriction of the right coronary cusp with severe aortic insufficiency.  Pressure half-time was measured at 460 ms.  Left ventricular diastolic diameter was 5.3 cm with an ejection fraction of 60 to 65%.  He subsequently underwent TEE on 08/24/2022 showing an eccentric AI jet with a pressure half-time of 258 ms.  There was holodiastolic reversal flow in the descending aorta.  He underwent cardiac catheterization on 09/28/2022 showing 70% proximal RCA stenosis.  The mid left circumflex had about 60% stenosis.  The LAD had 2 areas of 50% mid vessel stenosis.  Right and left heart filling pressures were normal.   The patient lives with his wife.  He reports exertional fatigue but does what he needs to do.  He has had some shortness of breath with exertion as well as occasionally at rest and with lying flat.  He denies any peripheral edema.  Dr. Laneta Simmers reviewed the patient's diagnostic studies and determined surgical intervention would benefit this patient. He reviewed the treatment options as well as the risks and benefits of surgery with the patient. Theodore Patton was agreeable to proceed with surgery.  Hospital Course: Theodore Patton presented to [facility] and  was brought to the operating room on 10/18/22. He underwent CABG x 1 utilizing LIMA to LAD, aortic valve replacement utilizing a 27mm Inspiris Aortic Valve as well as endoscopic harvest of the right greater saphenous vein which was too small to be used as a graft. He remained intubated overnight due to apnea but was extubated the following morning. His drips were weaned as hemodynamics  tolerated. He had expected acute postoperative blood loss anemia, he was transfused with 2 units of PRBCs and started on an iron supplement.  The anemia has stabilized.  His Aspirin was decreased to 81mg  due to thrombocytopenia.  He has remained neurologically intact. He did develop atrial fibrillation with RVR and was started on intravenous amiodarone with subsequent transition to oral dosing. Low dose beta blocker was started. He has had some intermittent sinus as well as junctional rhythm following resolution of the A-fib.  He did have some expected postoperative volume overload with a short-term acute renal insufficiency with creatinine peaking to 1.44. It has since returned to normal.  He is responding well to diuretics.  On postop day #3 he was transferred to 4 E. telemetry unit for ongoing rehabilitation and recovery. He remained in normal sinus rhythm on oral Amiodarone. CXR showed atelectasis, pulmonary hygiene was encouraged. He was hypertensive, low dose Norvasc was started. He was saturating well on room air and ambulating well independently. His incisions were healing well without sign of infection. He was felt stable for discharge.    Consults: None  Significant Diagnostic Studies:  RIGHT/LEFT HEART CATH AND CORONARY ANGIOGRAPHY   Conclusion   Prox LAD lesion is 20% stenosed.   Mid LAD-1 lesion is 50% stenosed.   Mid LAD-2 lesion is 50% stenosed.   Mid Cx lesion is 60% stenosed.   Prox RCA lesion is 70% stenosed.   1.  Moderate to severe multivessel disease consisting of LAD, circumflex, and right coronary artery lesions. 2.  Fick cardiac output of 6.7 L/min and Fick cardiac index of 3.1 L/min/m with the following hemodynamics:            Right atrial pressure mean of 2 mmHg            Right ventricular pressure 26/1 with a end-diastolic pressure of 4 mmHg            Wedge pressure mean of 7 mmHg            Pulmonary artery pressure of 26/5 with a mean of 10 mmHg 3.  LVEDP of 8 to  9 mmHg.   Recommendation: Cardiothoracic surgical evaluation.  TRANSESOPHOGEAL ECHO REPORT       Patient Name:   Theodore Patton Date of Exam: 08/24/2022  Medical Rec #:  [id number]     Height:       73.0 in  Accession #:    [id number]    Weight:       190.0 lb  Date of Birth:  [birth year]     BSA:          2.105 m  Patient Age:    76 years      BP:           138/64 mmHg  Patient Gender: M             HR:           68 bpm.  Exam Location:  Outpatient   Procedure: 3D Echo, Transesophageal Echo, Cardiac Doppler and Color  Doppler   Indications:  Aortic valve insufficiency    History:        Patient has prior history of Echocardiogram examinations,  most                 recent 07/25/2022. Aortic Valve Disease,  Signs/Symptoms:Murmur;                 Risk Factors:Dyslipidemia.    Sonographer:    Milda Smart  Referring Phys: [phone] MIHAI CROITORU   PROCEDURE: After discussion of the risks and benefits of a TEE, an  informed consent was obtained from the patient. TEE procedure time was 14  minutes. The transesophogeal probe was passed without difficulty through  the esophogus of the patient. Imaged  were obtained with the patient in a left lateral decubitus position.  Sedation performed by different physician. The patient was monitored while  under deep sedation. Anesthestetic sedation was provided intravenously by  Anesthesiology: 176.98mg  of  Propofol. Image quality was good. The patient's vital signs; including  heart rate, blood pressure, and oxygen saturation; remained stable  throughout the procedure. The patient developed no complications during  the procedure.    IMPRESSIONS     1. Left ventricular ejection fraction, by estimation, is 65 to 70%. The  left ventricle has normal function. The left ventricle has no regional  wall motion abnormalities. The left ventricular internal cavity size was  mildly dilated.   2. Right ventricular systolic function is normal. The  right ventricular  size is normal. Tricuspid regurgitation signal is inadequate for assessing  PA pressure.   3. Left atrial size was mildly dilated. No left atrial/left atrial  appendage thrombus was detected.   4. The mitral valve is normal in structure. Mild mitral valve  regurgitation.   5. 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 The aortic ejection jet is highly eccentric,  directed at the anterior mitral leaflet. The AR vena contracta is 6 mm.  The regurgitant volume is 104 ml,  regurgitant fraction is 68%. The 3D guided ERO area is 0.56 cm sq. The  pressure half time of the AR jet is 258 ms. There is holodiastolic  reversal of flow in the descending aorta. The aortic valve is tricuspid.  There is mild calcification of the aortic  valve. There is mild thickening of the aortic valve. Aortic valve  regurgitation is severe. Aortic valve sclerosis is present, with no  evidence of aortic valve stenosis.   6. The aortic annulus is dilated (3.2 cm) and the sinuses of Valsalva are  mildly dilated (4.1 cm) , but the remainder of the aorta is normal in  caliber. Aortic dilatation noted. There is mild dilatation of the aortic  root, measuring 41 mm. There is mild   (Grade II) layered plaque involving the descending aorta.   FINDINGS   Left Ventricle: Left ventricular ejection fraction, by estimation, is 65  to 70%. The left ventricle has normal function. The left ventricle has no  regional wall motion abnormalities. The left ventricular internal cavity  size was mildly dilated.  Concentric left ventricular hypertrophy.   Right Ventricle: The right ventricular size is normal. No increase in  right ventricular wall thickness. Right ventricular systolic function is  normal. Tricuspid regurgitation signal is inadequate for assessing PA  pressure.   Left Atrium: Left atrial size was mildly dilated. No left atrial/left  atrial appendage  thrombus was detected.   Right Atrium: Right atrial size  was normal in size.   Pericardium: There is no evidence of pericardial effusion.   Mitral Valve: The mitral valve is normal in structure. Mild mitral valve  regurgitation.   Tricuspid Valve: The tricuspid valve is normal in structure. Tricuspid  valve regurgitation is not demonstrated.   Aortic Valve: There is mild prolapse of the left coronary cusp and there  is dilation of the aortic annulus. The aortic ejection jet is highly  eccentric, directed at the anterior mitral leaflet. The AR vena contracta  is 6 mm. The regurgitant volume is 104   ml, regurgitant fraction is 68%. The 3D guided ERO area is 0.56 cm sq.  The pressure half time of the AR jet is 258 ms. There is holodiastolic  reversal of flow in the descending aorta. The aortic valve is tricuspid.  There is mild calcification of the  aortic valve. There is mild thickening of the aortic valve. Aortic valve  regurgitation is severe. Aortic regurgitation PHT measures 280 msec.  Aortic valve sclerosis is present, with no evidence of aortic valve  stenosis. Aortic valve mean gradient  measures 4.2 mmHg. Aortic valve peak gradient measures 10.0 mmHg. Aortic  valve area, by VTI measures 6.18 cm.   Pulmonic Valve: The pulmonic valve was normal in structure. Pulmonic valve  regurgitation is trivial.   Aorta: The aortic annulus is dilated (3.2 cm) and the sinuses of Valsalva  are mildly dilated (4.1 cm) , but the remainder of the aorta is normal in  caliber. Aortic dilatation noted. There is mild dilatation of the aortic  root, measuring 41 mm. There is  mild (Grade II) layered plaque involving the descending aorta.   IAS/Shunts: No atrial level shunt detected by color flow Doppler.   Additional Comments: Spectral Doppler performed.   LEFT VENTRICLE  PLAX 2D  LVOT diam:     2.90 cm  LV SV:         152  LV SV Index:   72  LVOT Area:     6.61 cm     AORTIC VALVE   AV Area (Vmax):    5.60 cm  AV Area (Vmean):   5.45 cm  AV Area (VTI):     6.18 cm  AV Vmax:           157.95 cm/s  AV Vmean:          94.448 cm/s  AV VTI:            0.246 m  AV Peak Grad:      10.0 mmHg  AV Mean Grad:      4.2 mmHg  LVOT Vmax:         133.81 cm/s  LVOT Vmean:        77.862 cm/s  LVOT VTI:          0.230 m  LVOT/AV VTI ratio: 0.94  AI PHT:            280 msec  AR Vena Contracta: 0.48 cm    AORTA  Ao Root diam: 4.10 cm  Ao Asc diam:  3.50 cm     SHUNTS  Systemic VTI:  0.23 m  Systemic Diam: 2.90 cm   Rachelle Hora Croitoru MD  Electronically signed by Thurmon Fair MD  Signature Date/Time: 08/24/2022/10:36:01 AM     Final    Treatments: surgery:  CARDIOVASCULAR SURGERY OPERATIVE NOTE 10/18/2022 Surgeon:  Alleen Borne, MD First Assistant: Aloha Gell,  PA-C and Gershon Crane, PA-C:  An experienced assistant was required given the complexity of this surgery and the standard of surgical care. The assistant was needed for endoscopic vein harvest, exposure, dissection, suctioning, retraction of delicate tissues and sutures, instrument exchange and for overall help during this procedure. Preoperative Diagnosis:  Severe aortic insufficiency and multi-vessel coronary artery disease Postoperative Diagnosis:  Same Procedure: Median Sternotomy Extracorporeal circulation 3.   Coronary artery bypass grafting x 1 Left internal mammary artery graft to the LAD  4.   Endoscopic vein harvest from the left leg 5.   Aortic valve replacement using a 27 mm Edwards INSPIRIS RESILIA pericardial valve   Discharge Exam: Blood pressure (!) 147/72, pulse 69, temperature 98.4 F (36.9 C), temperature source Oral, resp. rate 18, height 6\' 1"  (1.854 m), weight 89.8 kg, SpO2 97 %. General appearance: alert, cooperative, and no distress Neurologic: intact Heart: Regular rate and rhythm, no murmur Lungs: clear to auscultation bilaterally Abdomen: soft, non-tender; bowel sounds  normal; no masses,  no organomegaly Extremities: edema trace Wound: Clean and dry without erythema or sign of infection  Discharge Medications:  The patient has been discharged on:   1.Beta Blocker:  Yes [  X ]                              No   [   ]                              If No, reason:  2.Ace Inhibitor/ARB: Yes [   ]                                     No  [  X  ]                                     If No, reason: AKI  3.Statin:   Yes [  X ]                  No  [   ]                  If No, reason:  4.Ecasa:  Yes  [  X ]                  No   [   ]                  If No, reason:  Patient had ACS upon admission: No  Plavix/P2Y12 inhibitor: Yes [   ]                                      No  [  X ]     Discharge Instructions     Amb Referral to Cardiac Rehabilitation   Complete by: As directed    Diagnosis:  CABG Valve Replacement     Valve: Aortic   CABG X ___: 1   After initial evaluation and assessments completed: Virtual Based Care may be provided alone or in conjunction with Phase 2 Cardiac Rehab based on patient barriers.: Yes   Intensive Cardiac Rehabilitation (  ICR) [facility] location only OR Traditional Cardiac Rehabilitation (TCR) *If criteria for ICR are not met will enroll in TCR [facility] only): Yes      Allergies as of 10/23/2022       Reactions   Sulfa Antibiotics Other (See Comments)   Blisters in mouth        Medication List     STOP taking these medications    diphenhydramine-acetaminophen 25-500 MG Tabs tablet Commonly known as: TYLENOL PM   naproxen sodium 220 MG tablet Commonly known as: ALEVE       TAKE these medications    acetaminophen 650 MG CR tablet Commonly known as: TYLENOL Take 1,300 mg by mouth 2 (two) times daily.   Align 4 MG Caps Take 4 mg by mouth daily as needed (gut health).   allopurinol 300 MG tablet Commonly known as: ZYLOPRIM Take 300 mg by mouth in the morning.   amiodarone 200 MG tablet Commonly  known as: PACERONE Take 2 tabs twice per day for 7 days then take 1 tab twice per day for 7 days then take 1 tab daily thereafter   amLODipine 2.5 MG tablet Commonly known as: NORVASC Take 1 tablet (2.5 mg total) by mouth daily.   aspirin EC 81 MG tablet Take 1 tablet (81 mg total) by mouth daily. Swallow whole.   buPROPion 150 MG 24 hr tablet Commonly known as: WELLBUTRIN XL Take 300 mg by mouth in the morning.   carvedilol 3.125 MG tablet Commonly known as: COREG Take 1 tablet (3.125 mg total) by mouth 2 (two) times daily with a meal.   cetirizine 10 MG tablet Commonly known as: ZYRTEC Take 10 mg by mouth at bedtime.   colchicine 0.6 MG tablet Take 0.6 mg by mouth every 6 (six) hours as needed (gout flares).   COSAMIN DS PO Take 1 tablet by mouth 2 (two) times daily. Triple Strength   famotidine 20 MG tablet Commonly known as: PEPCID Take 20 mg by mouth at bedtime.   Fe Fum-Vit C-Vit B12-FA Caps capsule Commonly known as: TRIGELS-F FORTE Take 1 capsule by mouth daily after breakfast. May substitute with similar generic medication   fluticasone 50 MCG/ACT nasal spray Commonly known as: FLONASE Place 1-2 sprays into both nostrils at bedtime as needed for allergies.   furosemide 40 MG tablet Commonly known as: LASIX Take 1 tablet (40 mg total) by mouth daily.   gabapentin 300 MG capsule Commonly known as: NEURONTIN Take 600 mg by mouth 3 (three) times daily.   multivitamin with minerals Tabs tablet Take 1 tablet by mouth daily. Complete Multivitamin   oxyCODONE 5 MG immediate release tablet Commonly known as: Oxy IR/ROXICODONE Take 1 tablet (5 mg total) by mouth every 6 (six) hours as needed for severe pain.   potassium chloride SA 20 MEQ tablet Commonly known as: KLOR-CON M Take 1 tablet (20 mEq total) by mouth daily.   rosuvastatin 10 MG tablet Commonly known as: CRESTOR Take 10 mg by mouth in the morning.   SALINE NASAL MIST NA Place 1 spray into the  nose 3 (three) times daily as needed (congestion).   tamsulosin 0.4 MG Caps capsule Commonly known as: FLOMAX Take 0.8 mg by mouth every evening.   TURMERIC PO Take 1,000 mg by mouth in the morning and at bedtime.               Durable Medical Equipment  (From admission, onward)  Start     Ordered   10/23/22 0756  For home use only DME Walker rolling  Once       Question Answer Comment  Walker: With 5 Inch Wheels   Patient needs a walker to treat with the following condition Physical deconditioning      10/23/22 0755   10/22/22 1634  For home use only DME Walker rolling  Once       Question Answer Comment  Walker: With 5 Inch Wheels   Patient needs a walker to treat with the following condition Physical deconditioning      10/22/22 1633            Follow-up Information     Alleen Borne, MD Follow up on 11/14/2022.   Specialty: Cardiothoracic Surgery Why: Follow up appointment is at 1:00PM Contact information: [address] [place] [address] [phone]         [facility] Follow up on 10/30/2022.   Specialty: Cardiothoracic Surgery Why: Nurse visit for suture removal at 10:30AM Contact information: [address] [place], [address] [place] [address] [phone]        [facility] Follow up on 11/14/2022.   Why: To get chest xray at 12:00PM Contact information: [address] [place] [address]        [facility] Follow up on 11/30/2022.   Specialty: Cardiology Why: Echocardiogram is at 10:35AM Contact information: [address] [id number] [address] [place] [address] [phone]        Levi Aland, NP Follow up on 11/02/2022.   Specialty: Cardiology Why: Cardiology follow up appointment is at 10:05AM Contact information: [address] [place]  [address] [phone]         [facility] Follow up.   Why: Iantha Fallen)- TCTS office protocol referral for [facility] needs- they will contact you to schedule Contact information: [address] [place] [address] [phone]                 Signed:  Jenny Reichmann, PA-C  10/23/2022, 10:33 AM

## 2022-10-19 NOTE — Progress Notes (Signed)
      [address]       [place] [address]             [phone]       POD # 1 AVR, CABG x 1  Resting  BP 123/70   Pulse 66   Temp 99.3 F (37.4 C) (Oral)   Resp (!) 26   Ht 6\' 1"  (1.854 m)   Wt 93.8 kg   SpO2 100%   BMI 27.28 kg/m  Off drips 4L [place]   Intake/Output Summary (Last 24 hours) at 10/19/2022 1555 Last data filed at 10/19/2022 1300 Gross per 24 hour  Intake 3662.69 ml  Output 4160 ml  Net -497.31 ml   CBG well controlled  Viviann Spare C. Dorris Fetch, MD [facility] [phone]

## 2022-10-19 NOTE — TOC Initial Note (Signed)
Transition of Care [facility]) - Initial/Assessment Note    Patient Details  Name: Theodore Patton MRN: [id number] Date of Birth: [birth year]  [facility] CM/SW Contact:    Elliot Cousin, RN Phone Number: [phone] 10/19/2022, 1:58 PM  Clinical Narrative:     TOC CM spoke to pt and wife at bedside. Wife will be at home to assist with care. Pt states he had RW and cane at home. [facility] was preoperatively arrange for HH at dc. Will continue to follow for dc needs.               Expected Discharge Plan: Home w Home Health Services Barriers to Discharge: Continued Medical Work up   Patient Goals and CMS Choice Patient states their goals for this hospitalization and ongoing recovery are:: wants to recover CMS Medicare.gov Compare Post Acute Care list provided to:: Patient Represenative (must comment) Choice offered to / list presented to : Spouse      Expected Discharge Plan and Services   Discharge Planning Services: CM Consult Post Acute Care Choice: Home Health Living arrangements for the past 2 months: Single Family Home                                      Prior Living Arrangements/Services Living arrangements for the past 2 months: Single Family Home Lives with:: Spouse Patient language and need for interpreter reviewed:: Yes        Need for Family Participation in Patient Care: Yes (Comment) Care giver support system in place?: Yes (comment) Current home services: DME (cane, rolling walker) Criminal Activity/Legal Involvement Pertinent to Current Situation/Hospitalization: No - Comment as needed  Activities of Daily Living      Permission Sought/Granted Permission sought to share information with : Case Manager, Family Supports, PCP Permission granted to share information with : Yes, Verbal Permission Granted  Share Information with NAME: Barbette Reichmann     Permission granted to share info w Relationship: wife  Permission granted to share  info w Contact Information: [phone]  Emotional Assessment Appearance:: Appears stated age Attitude/Demeanor/Rapport: Lethargic Affect (typically observed): Accepting Orientation: : Oriented to Self, Oriented to Place, Oriented to  Time, Oriented to Situation   Psych Involvement: No (comment)  Admission diagnosis:  CAD (coronary artery disease) [I25.10] S/P AVR (aortic valve replacement) [Z95.2] Patient Active Problem List   Diagnosis Date Noted   S/P CABG x 1 10/19/2022   CAD (coronary artery disease) 10/18/2022   S/P AVR (aortic valve replacement) 10/18/2022   S/P reverse total shoulder arthroplasty, left 03/06/2022   Coronary artery calcification seen on CAT scan 02/27/2021   Primary localized osteoarthritis of right knee 04/14/2019   Status post right partial knee replacement 04/14/2019   Aortic regurgitation 04/11/2018   Aortic root dilatation (HCC) 04/11/2018   Mitral regurgitation 04/11/2018   Hyperlipemia    Primary localized osteoarthrosis of right shoulder 07/24/2016   S/P shoulder replacement, right 07/24/2016   PCP:  Chilton Greathouse, MD Pharmacy:   [facility] [address] [place] - [address] [place] [address] Phone: [phone] Fax: [phone]     Social Determinants of Health (SDOH) Social History: SDOH Screenings   Food Insecurity: No Food Insecurity (10/19/2022)  Housing: Low Risk  (03/06/2022)  Transportation Needs: No Transportation Needs (10/19/2022)  Utilities: Not At Risk (10/19/2022)  Financial Resource Strain:  Low Risk  (10/19/2022)  Tobacco Use: Medium Risk (10/19/2022)   SDOH Interventions: Food Insecurity Interventions: Intervention Not Indicated Transportation Interventions: Intervention Not Indicated Utilities Interventions: Intervention Not Indicated Financial Strain Interventions: Intervention Not Indicated   Readmission Risk Interventions     No data to display

## 2022-10-19 NOTE — Progress Notes (Signed)
1 Day Post-Op Procedure(s) (LRB): AORTIC VALVE REPLACEMENT (AVR) USING INSPIRIS AORTIC VALVE SIZE (N/A) CORONARY ARTERY BYPASS GRAFTING (CABG) X 1 USING LEFT INTERNAL MAMMARY  ARTERY AND ENDOSCOPICALLY HARVESTED LEFT GREATER SAPHENOUS VEIN (N/A) TRANSESOPHAGEAL ECHOCARDIOGRAM (N/A) Subjective: Still on vent but awake and alert. Was having some apnea overnight and not awake enough to extubate.  Objective: Vital signs in last 24 hours: Temp:  [95.5 F (35.3 C)-100.9 F (38.3 C)] 99.9 F (37.7 C) (05/17 0523) Pulse Rate:  [55-85] 80 (05/17 0523) Cardiac Rhythm: A-V Sequential paced (05/17 0400) Resp:  [11-22] 18 (05/17 0523) BP: (85-137)/(54-78) 101/61 (05/17 0523) SpO2:  [94 %-100 %] 100 % (05/17 0400) Arterial Line BP: (86-145)/(49-74) 114/56 (05/17 0400) FiO2 (%):  [40 %-50 %] 40 % (05/17 0400) Weight:  [86.2 kg-93.8 kg] 93.8 kg (05/17 0523)  Hemodynamic parameters for last 24 hours: PAP: (2-31)/(-4-18) 25/14 CVP:  [8 mmHg-25 mmHg] 10 mmHg PCWP:  [13 mmHg] 13 mmHg CO:  [3.9 L/min-6.4 L/min] 6.4 L/min CI:  [2 L/min/m2-3 L/min/m2] 3 L/min/m2  Intake/Output from previous day: 05/16 0701 - 05/17 0700 In: 6100.8 [I.V.:3014.4; Blood:1306.2; IV Piggyback:1780.2] Out: 4910 [Urine:4150; Chest Tube:760] Intake/Output this shift: Total I/O In: 912.7 [I.V.:374.7; IV Piggyback:538.1] Out: 980 [Urine:750; Chest Tube:230]  General appearance: alert and cooperative Neurologic: intact Heart: regular rate and rhythm, S1, S2 normal, no murmur Lungs: clear to auscultation bilaterally Extremities: edema mild Wound: dressing dry  Lab Results: Recent Labs    10/18/22 2021 10/19/22 0303 10/19/22 0310  WBC 5.4  --  8.1  HGB 7.0* 6.5* 6.7*  HCT 20.7* 19.0* 19.9*  PLT 75*  --  79*   BMET:  Recent Labs    10/18/22 2021 10/19/22 0303 10/19/22 0310  NA 137 139 136  K 4.9 4.7 4.6  CL 105  --  104  CO2 24  --  23  GLUCOSE 115*  --  127*  BUN 16  --  17  CREATININE 1.09  --   1.20  CALCIUM 8.5*  --  8.5*    PT/INR:  Recent Labs    10/18/22 1345  LABPROT 18.9*  INR 1.6*   ABG    Component Value Date/Time   PHART 7.390 10/19/2022 0303   HCO3 23.2 10/19/2022 0303   TCO2 24 10/19/2022 0303   ACIDBASEDEF 1.0 10/19/2022 0303   O2SAT 99 10/19/2022 0303   CBG (last 3)  Recent Labs    10/19/22 0030 10/19/22 0205 10/19/22 0413  GLUCAP 130* 130* 126*   CXR: left base atelectasis  Assessment/Plan: S/P Procedure(s) (LRB): AORTIC VALVE REPLACEMENT (AVR) USING INSPIRIS AORTIC VALVE SIZE (N/A) CORONARY ARTERY BYPASS GRAFTING (CABG) X 1 USING LEFT INTERNAL MAMMARY  ARTERY AND ENDOSCOPICALLY HARVESTED LEFT GREATER SAPHENOUS VEIN (N/A) TRANSESOPHAGEAL ECHOCARDIOGRAM (N/A)  POD 1 Hemodynamically stable in sinus brady rhythm. Continue atrial pacing for now until rate increases. Hold on beta blocker.  Wean to extubate.  Acute postop blood loss anemia with coagulopathy corrected with plts and FFP postop. Chest tube output now low. Hgb 6.5 this am so will transfuse 2 units PRBC's. Start iron.  Thrombocytopenia: observe. ASA to 81 mg. No Lovenox.  Keep chest tubes today.  Diurese.  IS, OOB later today after extubation.   LOS: 1 day    Alleen Borne 10/19/2022

## 2022-10-19 NOTE — Procedures (Signed)
Extubation Procedure Note  Patient Details:   Name: MENELIK KOSIK DOB: 1945-12-08 MRN: [id number]   Airway Documentation:    Vent end date: 10/19/22 Vent end time: 0812   Evaluation  O2 sats: stable throughout Complications: No apparent complications Patient did tolerate procedure well. Bilateral Breath Sounds: Clear   Yes  Pt extubated for 4L [place]. Pt tolerated well. Cuff leak positive, no stridor noted, vitals stable, RN at bedside, RT will monitor as needed.  Thornell Mule 10/19/2022, 9:39 AM

## 2022-10-20 ENCOUNTER — Inpatient Hospital Stay (HOSPITAL_COMMUNITY): Payer: Medicare Other

## 2022-10-20 LAB — BASIC METABOLIC PANEL
Anion gap: 11 (ref 5–15)
BUN: 24 mg/dL — ABNORMAL HIGH (ref 8–23)
CO2: 28 mmol/L (ref 22–32)
Calcium: 8.6 mg/dL — ABNORMAL LOW (ref 8.9–10.3)
Chloride: 97 mmol/L — ABNORMAL LOW (ref 98–111)
Creatinine, Ser: 1.3 mg/dL — ABNORMAL HIGH (ref 0.61–1.24)
GFR, Estimated: 58 mL/min — ABNORMAL LOW (ref >60)
Glucose, Bld: 133 mg/dL — ABNORMAL HIGH (ref 70–99)
Potassium: 3.8 mmol/L (ref 3.5–5.1)
Sodium: 136 mmol/L (ref 135–145)

## 2022-10-20 LAB — COMPREHENSIVE METABOLIC PANEL
ALT: 13 U/L (ref 0–44)
AST: 31 U/L (ref 15–41)
Albumin: 3.7 g/dL (ref 3.5–5.0)
Alkaline Phosphatase: 31 U/L — ABNORMAL LOW (ref 38–126)
Anion gap: 10 (ref 5–15)
BUN: 27 mg/dL — ABNORMAL HIGH (ref 8–23)
CO2: 28 mmol/L (ref 22–32)
Calcium: 8.5 mg/dL — ABNORMAL LOW (ref 8.9–10.3)
Chloride: 96 mmol/L — ABNORMAL LOW (ref 98–111)
Creatinine, Ser: 1.23 mg/dL (ref 0.61–1.24)
GFR, Estimated: >60 mL/min (ref >60)
Glucose, Bld: 158 mg/dL — ABNORMAL HIGH (ref 70–99)
Potassium: 3.8 mmol/L (ref 3.5–5.1)
Sodium: 134 mmol/L — ABNORMAL LOW (ref 135–145)
Total Bilirubin: 0.5 mg/dL (ref 0.3–1.2)
Total Protein: 5.7 g/dL — ABNORMAL LOW (ref 6.5–8.1)

## 2022-10-20 LAB — BPAM RBC
Blood Product Expiration Date: 202405242359
Blood Product Expiration Date: 202406072359
ISSUE DATE / TIME: 202405170516
ISSUE DATE / TIME: 202405170642

## 2022-10-20 LAB — TYPE AND SCREEN: Antibody Screen: NEGATIVE

## 2022-10-20 LAB — CBC
HCT: 24.5 % — ABNORMAL LOW (ref 39.0–52.0)
Hemoglobin: 8.5 g/dL — ABNORMAL LOW (ref 13.0–17.0)
MCH: 32.6 pg (ref 26.0–34.0)
MCHC: 34.7 g/dL (ref 30.0–36.0)
MCV: 93.9 fL (ref 80.0–100.0)
Platelets: 67 10*3/uL — ABNORMAL LOW (ref 150–400)
RBC: 2.61 MIL/uL — ABNORMAL LOW (ref 4.22–5.81)
RDW: 16.2 % — ABNORMAL HIGH (ref 11.5–15.5)
WBC: 11.4 10*3/uL — ABNORMAL HIGH (ref 4.0–10.5)
nRBC: 0 % (ref 0.0–0.2)

## 2022-10-20 LAB — GLUCOSE, CAPILLARY
Glucose-Capillary: 131 mg/dL — ABNORMAL HIGH (ref 70–99)
Glucose-Capillary: 141 mg/dL — ABNORMAL HIGH (ref 70–99)
Glucose-Capillary: 138 mg/dL — ABNORMAL HIGH (ref 70–99)
Glucose-Capillary: 130 mg/dL — ABNORMAL HIGH (ref 70–99)
Glucose-Capillary: 145 mg/dL — ABNORMAL HIGH (ref 70–99)

## 2022-10-20 MED ORDER — POTASSIUM CHLORIDE CRYS ER 20 MEQ PO TBCR
20.0000 meq | EXTENDED_RELEASE_TABLET | ORAL | Status: AC
  Administered 2022-10-20: 20 meq via ORAL
  Filled 2022-10-20 (×2): qty 1

## 2022-10-20 MED ORDER — POTASSIUM CHLORIDE 10 MEQ/100ML IV SOLN
10.0000 meq | INTRAVENOUS | Status: AC
  Administered 2022-10-20 (×2): 10 meq via INTRAVENOUS
  Filled 2022-10-20 (×2): qty 100

## 2022-10-20 MED ORDER — AMIODARONE HCL IN DEXTROSE 360-4.14 MG/200ML-% IV SOLN
30.0000 mg/h | INTRAVENOUS | Status: AC
  Administered 2022-10-20 – 2022-10-21 (×2): 30 mg/h via INTRAVENOUS
  Filled 2022-10-20 (×2): qty 200

## 2022-10-20 MED ORDER — ORAL CARE MOUTH RINSE: 15.0000 mL | OROMUCOSAL | Status: DC | PRN

## 2022-10-20 MED ORDER — AMIODARONE LOAD VIA INFUSION
150.0000 mg | Freq: Once | INTRAVENOUS | Status: AC
  Administered 2022-10-20: 150 mg via INTRAVENOUS
  Filled 2022-10-20: qty 83.34

## 2022-10-20 MED ORDER — INSULIN ASPART 100 UNIT/ML IJ SOLN
0.0000 [IU] | Freq: Three times a day (TID) | INTRAMUSCULAR | Status: DC
  Administered 2022-10-20 – 2022-10-21 (×4): 2 [IU] via SUBCUTANEOUS

## 2022-10-20 MED ORDER — AMIODARONE HCL IN DEXTROSE 360-4.14 MG/200ML-% IV SOLN
60.0000 mg/h | INTRAVENOUS | Status: AC
  Administered 2022-10-20: 60 mg/h via INTRAVENOUS
  Filled 2022-10-20 (×2): qty 200

## 2022-10-20 MED ORDER — CARVEDILOL 6.25 MG PO TABS
6.2500 mg | ORAL_TABLET | Freq: Two times a day (BID) | ORAL | Status: DC
  Filled 2022-10-20: qty 1

## 2022-10-20 NOTE — Progress Notes (Signed)
Patient refused most PO medications this morning, stating he was too nauseous to take them. I came back an hour later and offered again, but he refused. According to [facility], Zofran was given IV at 0628 this morning. Patient was educated on the importance of taking these medications.   Christain Sacramento, RN

## 2022-10-20 NOTE — Progress Notes (Signed)
      [address]       [place] [address]             [phone]      Ambulated well  BP 123/66   Pulse 60   Temp 97.9 F (36.6 C) (Oral)   Resp 19   Ht 6\' 1"  (1.854 m)   Wt 89.3 kg   SpO2 95%   BMI 25.97 kg/m  2l [place]  Intake/Output Summary (Last 24 hours) at 10/20/2022 1810 Last data filed at 10/20/2022 0800 Gross per 24 hour  Intake 481.58 ml  Output 1210 ml  Net -728.42 ml   Refused PO K- will give 2 run IV  Back in SR on amiodarone  Levy Wellman C. Dorris Fetch, MD [facility] [phone]

## 2022-10-20 NOTE — Progress Notes (Signed)
Chest tubes removed per order at approximately 1144. Pt tolerated well. Dressing applied with petroleum gauze and gauze and pacing wires re-taped with paper tape.

## 2022-10-20 NOTE — Progress Notes (Signed)
2 Days Post-Op Procedure(s) (LRB): AORTIC VALVE REPLACEMENT (AVR) USING INSPIRIS AORTIC VALVE SIZE (N/A) CORONARY ARTERY BYPASS GRAFTING (CABG) X 1 USING LEFT INTERNAL MAMMARY  ARTERY AND ENDOSCOPICALLY HARVESTED LEFT GREATER SAPHENOUS VEIN (N/A) TRANSESOPHAGEAL ECHOCARDIOGRAM (N/A) Subjective: Some pain left lateral chest  Objective: Vital signs in last 24 hours: Temp:  [98.3 F (36.8 C)-99.5 F (37.5 C)] 98.5 F (36.9 C) (05/18 0731) Pulse Rate:  [58-119] 118 (05/18 0730) Cardiac Rhythm: Atrial fibrillation (05/18 0655) Resp:  [11-29] 18 (05/18 0730) BP: (86-151)/(57-105) 112/78 (05/18 0700) SpO2:  [95 %-100 %] 98 % (05/18 0730) Arterial Line BP: (130-181)/(58-75) 167/62 (05/17 1300) FiO2 (%):  [40 %] 40 % (05/17 0800) Weight:  [89.3 kg] 89.3 kg (05/18 0500)  Hemodynamic parameters for last 24 hours: PAP: (26-27)/(14) 27/14 CVP:  [10 mmHg-18 mmHg] 10 mmHg  Intake/Output from previous day: 05/17 0701 - 05/18 0700 In: 1113.5 [P.O.:120; I.V.:293.1; Blood:315; IV Piggyback:385.4] Out: 3320 [Urine:3050; Chest Tube:270] Intake/Output this shift: No intake/output data recorded.  General appearance: alert, cooperative, and no distress Neurologic: no focal weakness Heart: irregularly irregular rhythm Lungs: diminished breath sounds bibasilar Abdomen: normal findings: soft, non-tender  Lab Results: Recent Labs    10/19/22 1816 10/20/22 0326  WBC 11.4* 11.4*  HGB 9.1* 8.5*  HCT 26.7* 24.5*  PLT 84* 67*   BMET:  Recent Labs    10/19/22 1816 10/20/22 0326  NA 135 136  K 4.0 3.8  CL 99 97*  CO2 24 28  GLUCOSE 183* 133*  BUN 20 24*  CREATININE 1.44* 1.30*  CALCIUM 8.8* 8.6*    PT/INR:  Recent Labs    10/18/22 1345  LABPROT 18.9*  INR 1.6*   ABG    Component Value Date/Time   PHART 7.390 10/19/2022 0303   HCO3 23.2 10/19/2022 0303   TCO2 24 10/19/2022 0303   ACIDBASEDEF 1.0 10/19/2022 0303   O2SAT 99 10/19/2022 0303   CBG (last 3)  Recent Labs     10/19/22 2334 10/20/22 0310 10/20/22 0734  GLUCAP 135* 131* 141*    Assessment/Plan: S/P Procedure(s) (LRB): AORTIC VALVE REPLACEMENT (AVR) USING INSPIRIS AORTIC VALVE SIZE (N/A) CORONARY ARTERY BYPASS GRAFTING (CABG) X 1 USING LEFT INTERNAL MAMMARY  ARTERY AND ENDOSCOPICALLY HARVESTED LEFT GREATER SAPHENOUS VEIN (N/A) TRANSESOPHAGEAL ECHOCARDIOGRAM (N/A) POD # 2 NEURO- no focal deficit CV- went into A fib with RVR after walk this AM- started on amiodarone  ASA, rosuvastatin, beta blocker RESP- IS for atelectasis RENAL-creatinine down to 1.33  Supplement K  Will hold off on additional diuresis right now due to A fib ENDO- CBG mildly elevated  Change to AC HS SSi Gi - diet as tolerated Anemia secondary to ABL- monitor Thrombocytopenia- PLT down to 67K  No enoxaparin, monitor Dc chest tubes   LOS: 2 days    Theodore Patton 10/20/2022

## 2022-10-21 ENCOUNTER — Inpatient Hospital Stay (HOSPITAL_COMMUNITY): Payer: Medicare Other

## 2022-10-21 LAB — BASIC METABOLIC PANEL
Anion gap: 8 (ref 5–15)
BUN: 33 mg/dL — ABNORMAL HIGH (ref 8–23)
CO2: 27 mmol/L (ref 22–32)
Calcium: 8.3 mg/dL — ABNORMAL LOW (ref 8.9–10.3)
Chloride: 98 mmol/L (ref 98–111)
Creatinine, Ser: 1.2 mg/dL (ref 0.61–1.24)
GFR, Estimated: >60 mL/min (ref >60)
Glucose, Bld: 134 mg/dL — ABNORMAL HIGH (ref 70–99)
Potassium: 4.3 mmol/L (ref 3.5–5.1)
Sodium: 133 mmol/L — ABNORMAL LOW (ref 135–145)

## 2022-10-21 LAB — CBC
HCT: 23.7 % — ABNORMAL LOW (ref 39.0–52.0)
Hemoglobin: 8 g/dL — ABNORMAL LOW (ref 13.0–17.0)
MCH: 32.3 pg (ref 26.0–34.0)
MCHC: 33.8 g/dL (ref 30.0–36.0)
MCV: 95.6 fL (ref 80.0–100.0)
Platelets: 61 10*3/uL — ABNORMAL LOW (ref 150–400)
RBC: 2.48 MIL/uL — ABNORMAL LOW (ref 4.22–5.81)
RDW: 15.4 % (ref 11.5–15.5)
WBC: 9.6 10*3/uL (ref 4.0–10.5)
nRBC: 0 % (ref 0.0–0.2)

## 2022-10-21 LAB — POCT I-STAT 7, (LYTES, BLD GAS, ICA,H+H)
Acid-base deficit: 2 mmol/L (ref 0.0–2.0)
Bicarbonate: 22.9 mmol/L (ref 20.0–28.0)
Calcium, Ion: 1.19 mmol/L (ref 1.15–1.40)
HCT: 23 % — ABNORMAL LOW (ref 39.0–52.0)
Hemoglobin: 7.8 g/dL — ABNORMAL LOW (ref 13.0–17.0)
O2 Saturation: 99 %
Patient temperature: 37.4
Potassium: 4.5 mmol/L (ref 3.5–5.1)
Sodium: 139 mmol/L (ref 135–145)
TCO2: 24 mmol/L (ref 22–32)
pCO2 arterial: 40.2 mmHg (ref 32–48)
pH, Arterial: 7.365 (ref 7.35–7.45)
pO2, Arterial: 130 mmHg — ABNORMAL HIGH (ref 83–108)

## 2022-10-21 LAB — GLUCOSE, CAPILLARY
Glucose-Capillary: 132 mg/dL — ABNORMAL HIGH (ref 70–99)
Glucose-Capillary: 133 mg/dL — ABNORMAL HIGH (ref 70–99)
Glucose-Capillary: 114 mg/dL — ABNORMAL HIGH (ref 70–99)
Glucose-Capillary: 112 mg/dL — ABNORMAL HIGH (ref 70–99)

## 2022-10-21 LAB — MAGNESIUM: Magnesium: 2.2 mg/dL (ref 1.7–2.4)

## 2022-10-21 MED ORDER — AMIODARONE HCL 200 MG PO TABS
400.0000 mg | ORAL_TABLET | Freq: Two times a day (BID) | ORAL | Status: DC
  Administered 2022-10-21 – 2022-10-23 (×5): 400 mg via ORAL
  Filled 2022-10-21 (×5): qty 2

## 2022-10-21 MED ORDER — MAGNESIUM HYDROXIDE 400 MG/5ML PO SUSP: 30.0000 mL | Freq: Every day | ORAL | Status: DC | PRN

## 2022-10-21 MED ORDER — FUROSEMIDE 40 MG PO TABS
40.0000 mg | ORAL_TABLET | Freq: Every day | ORAL | Status: DC
  Administered 2022-10-21 – 2022-10-23 (×3): 40 mg via ORAL
  Filled 2022-10-21 (×3): qty 1

## 2022-10-21 MED ORDER — ALUM & MAG HYDROXIDE-SIMETH 200-200-20 MG/5ML PO SUSP: 15.0000 mL | Freq: Four times a day (QID) | ORAL | Status: DC | PRN

## 2022-10-21 MED ORDER — SODIUM CHLORIDE 0.9 % IV SOLN: 250.0000 mL | INTRAVENOUS | Status: DC | PRN

## 2022-10-21 MED ORDER — METOCLOPRAMIDE HCL 5 MG/5ML PO SOLN
10.0000 mg | Freq: Three times a day (TID) | ORAL | Status: AC
  Administered 2022-10-21 – 2022-10-22 (×4): 10 mg via ORAL
  Filled 2022-10-21 (×4): qty 10

## 2022-10-21 MED ORDER — ~~LOC~~ CARDIAC SURGERY, PATIENT & FAMILY EDUCATION: Freq: Once | Status: AC

## 2022-10-21 MED ORDER — SODIUM CHLORIDE 0.9% FLUSH
3.0000 mL | Freq: Two times a day (BID) | INTRAVENOUS | Status: DC
  Administered 2022-10-21 – 2022-10-22 (×4): 3 mL via INTRAVENOUS

## 2022-10-21 MED ORDER — SODIUM CHLORIDE 0.9% FLUSH: 3.0000 mL | INTRAVENOUS | Status: DC | PRN

## 2022-10-21 MED ORDER — CARVEDILOL 3.125 MG PO TABS
3.1250 mg | ORAL_TABLET | Freq: Two times a day (BID) | ORAL | Status: DC
  Administered 2022-10-21 – 2022-10-23 (×4): 3.125 mg via ORAL
  Filled 2022-10-21 (×4): qty 1

## 2022-10-21 MED ORDER — ORAL CARE MOUTH RINSE: 15.0000 mL | OROMUCOSAL | Status: DC | PRN

## 2022-10-21 NOTE — Progress Notes (Addendum)
3 Days Post-Op Procedure(s) (LRB): AORTIC VALVE REPLACEMENT (AVR) USING INSPIRIS AORTIC VALVE SIZE (N/A) CORONARY ARTERY BYPASS GRAFTING (CABG) X 1 USING LEFT INTERNAL MAMMARY  ARTERY AND ENDOSCOPICALLY HARVESTED LEFT GREATER SAPHENOUS VEIN (N/A) TRANSESOPHAGEAL ECHOCARDIOGRAM (N/A) Subjective: Mild incisional pain, poor appetite  Objective: Vital signs in last 24 hours: Temp:  [97.9 F (36.6 C)-98.3 F (36.8 C)] 98.1 F (36.7 C) (05/18 1900) Pulse Rate:  [57-117] 58 (05/19 0700) Cardiac Rhythm: Junctional rhythm;Sinus bradycardia (05/19 0400) Resp:  [11-28] 24 (05/19 0700) BP: (89-123)/(53-89) 90/65 (05/19 0600) SpO2:  [88 %-100 %] 88 % (05/19 0700) Weight:  [89.2 kg] 89.2 kg (05/19 0500)  Hemodynamic parameters for last 24 hours:    Intake/Output from previous day: 05/18 0701 - 05/19 0700 In: 249.4 [I.V.:249.4] Out: 1015 [Urine:915; Chest Tube:100] Intake/Output this shift: No intake/output data recorded.  General appearance: alert, cooperative, and no distress Neurologic: intact Heart: brady, regular Lungs: diminished breath sounds bibasilar Abdomen: mildly distended, nontender, + BS  Lab Results: Recent Labs    10/20/22 0326 10/21/22 0115  WBC 11.4* 9.6  HGB 8.5* 8.0*  HCT 24.5* 23.7*  PLT 67* 61*   BMET:  Recent Labs    10/20/22 0917 10/21/22 0115  NA 134* 133*  K 3.8 4.3  CL 96* 98  CO2 28 27  GLUCOSE 158* 134*  BUN 27* 33*  CREATININE 1.23 1.20  CALCIUM 8.5* 8.3*    PT/INR:  Recent Labs    10/18/22 1345  LABPROT 18.9*  INR 1.6*   ABG    Component Value Date/Time   PHART 7.390 10/19/2022 0303   HCO3 23.2 10/19/2022 0303   TCO2 24 10/19/2022 0303   ACIDBASEDEF 1.0 10/19/2022 0303   O2SAT 99 10/19/2022 0303   CBG (last 3)  Recent Labs    10/20/22 1637 10/20/22 2212 10/21/22 0619  GLUCAP 130* 145* 132*    Assessment/Plan: S/P Procedure(s) (LRB): AORTIC VALVE REPLACEMENT (AVR) USING INSPIRIS AORTIC VALVE SIZE  (N/A) CORONARY ARTERY BYPASS GRAFTING (CABG) X 1 USING LEFT INTERNAL MAMMARY  ARTERY AND ENDOSCOPICALLY HARVESTED LEFT GREATER SAPHENOUS VEIN (N/A) TRANSESOPHAGEAL ECHOCARDIOGRAM (N/A) POD # 3 AVR , CABG  NEURO- intact CV- back and forth between SR and junctional around 55-60 bpm  Convert amiodarone to PO  Decrease Coreg to 3.125 mg BID  ASA, statin RESP- continue IS RENAL- creatinine normal lytes Ok  Still fluid overloaded- PO lasix  Dc Foley ENDO- CBG mildly elevated  SSI GI- poor appetite but does have BS  Will add Reglan PO x 24 hours Anemia secondary to ABL- stable, monitor Thrombocytopenia- LT down slightly to 61K, no bleeding, monitor DC central line Cardiac rehab Txf 4E   LOS: 3 days    Loreli Slot 10/21/2022

## 2022-10-21 NOTE — Progress Notes (Signed)
Patient brought to 4E from 2H. VSS. Telemetry box applied, CCMD notified. Patient oriented to room and staff. Call bell in reach. ? ?Theodore Patton Theodore Chloris Marcoux, RN  ?

## 2022-10-22 ENCOUNTER — Inpatient Hospital Stay (HOSPITAL_COMMUNITY): Payer: Medicare Other

## 2022-10-22 LAB — BASIC METABOLIC PANEL
Anion gap: 7 (ref 5–15)
BUN: 32 mg/dL — ABNORMAL HIGH (ref 8–23)
CO2: 26 mmol/L (ref 22–32)
Calcium: 7.9 mg/dL — ABNORMAL LOW (ref 8.9–10.3)
Chloride: 96 mmol/L — ABNORMAL LOW (ref 98–111)
Creatinine, Ser: 1.25 mg/dL — ABNORMAL HIGH (ref 0.61–1.24)
GFR, Estimated: >60 mL/min (ref >60)
Glucose, Bld: 112 mg/dL — ABNORMAL HIGH (ref 70–99)
Potassium: 3.7 mmol/L (ref 3.5–5.1)
Sodium: 129 mmol/L — ABNORMAL LOW (ref 135–145)

## 2022-10-22 LAB — CBC
HCT: 22.3 % — ABNORMAL LOW (ref 39.0–52.0)
Hemoglobin: 7.6 g/dL — ABNORMAL LOW (ref 13.0–17.0)
MCH: 32.2 pg (ref 26.0–34.0)
MCHC: 34.1 g/dL (ref 30.0–36.0)
MCV: 94.5 fL (ref 80.0–100.0)
Platelets: 76 10*3/uL — ABNORMAL LOW (ref 150–400)
RBC: 2.36 MIL/uL — ABNORMAL LOW (ref 4.22–5.81)
RDW: 15 % (ref 11.5–15.5)
WBC: 7.7 10*3/uL (ref 4.0–10.5)
nRBC: 0 % (ref 0.0–0.2)

## 2022-10-22 LAB — GLUCOSE, CAPILLARY: Glucose-Capillary: 124 mg/dL — ABNORMAL HIGH (ref 70–99)

## 2022-10-22 MED ORDER — POTASSIUM CHLORIDE CRYS ER 20 MEQ PO TBCR
20.0000 meq | EXTENDED_RELEASE_TABLET | Freq: Every day | ORAL | Status: DC
  Administered 2022-10-22: 20 meq via ORAL
  Filled 2022-10-22: qty 1

## 2022-10-22 MED ORDER — POTASSIUM CHLORIDE CRYS ER 20 MEQ PO TBCR
40.0000 meq | EXTENDED_RELEASE_TABLET | Freq: Once | ORAL | Status: AC
  Administered 2022-10-22: 40 meq via ORAL
  Filled 2022-10-22: qty 2

## 2022-10-22 NOTE — Progress Notes (Signed)
CARDIAC REHAB PHASE I   PRE:  Rate/Rhythm: 63 SR  BP:  Sitting: 153/81      SaO2: 96 RA  MODE:  Ambulation: 240 ft   POST:  Rate/Rhythm: 68 SR  BP:  Sitting: 156/80      SaO2: 98 RA  Pt resting in bed, feeling well today. Ambulated in hall, using front wheel walker. Walking with slow steady pace. Tolerated well with no SOB, pain or dizziness. Returned to bed with call bell and bedside table in reach. Encouraged IS use and continued mobility. Will continue to follow.   [id number]  Woodroe Chen, RN BSN 10/22/2022 11:41 AM

## 2022-10-22 NOTE — Care Management Important Message (Signed)
Important Message  Patient Details  Name: Theodore Patton MRN: [id number] Date of Birth: 11/11/1945   Medicare Important Message Given:  Yes     Renie Ora 10/22/2022, 8:37 AM

## 2022-10-22 NOTE — Evaluation (Signed)
Occupational Therapy Evaluation Patient Details Name: Theodore Patton MRN: [id number] DOB: [birth year] Today's Date: 10/22/2022   History of Present Illness 77 y.o. male presents to [facility] hospital on 10/18/2022 for aortic valve replacement. Pt underwent Aortic valve replacement and CABG x1 on 5/16. Extubated 5/17. PMH includes HLD, aortic stenosis, OA, GERD, gout, HLD.   Clinical Impression   Patient evaluated by Occupational Therapy with no further acute OT needs identified. All education has been completed and the patient has no further questions. Discussed cardiac/sternal precautions during ADL tasks. Prior to admit, pt was living with his wife, independent with ADL tasks and functional mobility. Wife present during evaluation and able to assist with daily tasks at home. Order placed by MD for cardiac rehab. I do not foresee any need for additional follow-up Occupational Therapy or equipment needs. OT is signing off. Thank you for this referral.       Recommendations for follow up therapy are one component of a multi-disciplinary discharge planning process, led by the attending physician.  Recommendations may be updated based on patient status, additional functional criteria and insurance authorization.   Assistance Recommended at Discharge  PRN  Patient can return home with the following A little help with walking and/or transfers;A little help with bathing/dressing/bathroom;Help with stairs or ramp for entrance;Assist for transportation;Assistance with cooking/housework    Functional Status Assessment  Patient has had a recent decline in their functional status and demonstrates the ability to make significant improvements in function in a reasonable and predictable amount of time.  Equipment Recommendations  None recommended by OT    Recommendations for Other Services Other (comment) (cardiac rehab)     Precautions / Restrictions Precautions Precautions: Fall;Sternal Precaution Booklet  Issued: Yes (comment) Precaution Comments: verbal education provided along with handout for reference. Wife reports that they have been provided with cardiac booklet and she has been reading through it. Restrictions Weight Bearing Restrictions: No Other Position/Activity Restrictions: sternal precautions. Limit amount of weight placed into BUE although is not strictly NWB.      Mobility Bed Mobility Overal bed mobility:  (defer to PT evaluation)    Patient Response: Flat affect, Cooperative  Transfers Overall transfer level:  (Defer to PT evaluation)       Balance Overall balance assessment:  (defer to PT evaluation)     ADL either performed or assessed with clinical judgement   ADL         General ADL Comments: SBA-Min guard is required for BADL task completion at this time due to sternal precautions. Wife is able to assist as needed.     Vision Baseline Vision/History: 1 Wears glasses (all the time) Ability to See in Adequate Light: 0 Adequate Patient Visual Report: No change from baseline Vision Assessment?: No apparent visual deficits            Pertinent Vitals/Pain Pain Assessment Pain Assessment: Faces Faces Pain Scale: No hurt     Hand Dominance Right   Extremity/Trunk Assessment Upper Extremity Assessment Upper Extremity Assessment: RUE deficits/detail;LUE deficits/detail RUE Deficits / Details: limited by sternal precautions LUE Deficits / Details: limited by sternal precautions   Lower Extremity Assessment Lower Extremity Assessment: Defer to PT evaluation   Cervical / Trunk Assessment Cervical / Trunk Assessment: Normal   Communication Communication Communication: No difficulties   Cognition Arousal/Alertness:  (sleepy. Eyes closed during majority of session) Behavior During Therapy: Flat affect Overall Cognitive Status: Within Functional Limits for tasks assessed       General  Comments  VSS on RA            Home Living  Family/patient expects to be discharged to:: Private residence Living Arrangements: Spouse/significant other Available Help at Discharge: Family;Available 24 hours/day Type of Home: House Home Access: Stairs to enter [employer] of Steps: 3 Entrance Stairs-Rails: Right Home Layout: One level     Bathroom Shower/Tub: Producer, television/film/video: Handicapped height     Home Equipment: Agricultural consultant (2 wheels);Cane - single point;Shower seat          Prior Functioning/Environment Prior Level of Function : Independent/Modified Independent;Driving    Mobility Comments: retired Management consultant, enjoys woodwork ADLs Comments: Independent        OT Problem List: Decreased strength;Impaired balance (sitting and/or standing);Decreased activity tolerance         OT Goals(Current goals can be found in the care plan section) Acute Rehab OT Goals Patient Stated Goal: none stated  OT Frequency:  1X visit       AM-PAC OT "6 Clicks" Daily Activity     Outcome Measure Help from another person eating meals?: None Help from another person taking care of personal grooming?: A Little Help from another person toileting, which includes using toliet, bedpan, or urinal?: A Little Help from another person bathing (including washing, rinsing, drying)?: A Little Help from another person to put on and taking off regular upper body clothing?: A Little Help from another person to put on and taking off regular lower body clothing?: A Little 6 Click Score: 19   End of Session    Activity Tolerance: Patient tolerated treatment well Patient left: in bed;with call bell/phone within reach;with bed alarm set;Other (comment) (with dietary staff to take meal order)  OT Visit Diagnosis: Muscle weakness (generalized) (M62.81)                Time: 1610-9604 OT Time Calculation (min): 10 min Charges:  OT General Charges $OT Visit: 1 Visit OT Evaluation $OT Eval Low Complexity: 1  Low  Limmie Patricia, OTR/L,CBIS  Supplemental OT - [facility] and [facility] Secure Chat Preferred    Nakeisha Greenhouse, Charisse March 10/22/2022, 10:51 AM

## 2022-10-22 NOTE — Evaluation (Signed)
Physical Therapy Evaluation Patient Details Name: Theodore Patton MRN: [id number] DOB: [birth year] Today's Date: 10/22/2022  History of Present Illness  77 y.o. male presents to [facility] hospital on 10/18/2022 for aortic valve replacement. Pt underwent Aortic valve replacement and CABG x1 on 5/16. Extubated 5/17. PMH includes HLD, aortic stenosis, OA, GERD, gout, HLD.  Clinical Impression  Pt presents to PT with deficits in endurance, cardiopulmonary function, strength, power, functional mobility. Pt is mobilizing well, with increased time and support of RW. PT encourages increased frequency of ambulation, suggesting at least 3 walks out of the room daily in an effort to improve strength and endurance. PT anticipates no post-acute PT or DME needs.       Recommendations for follow up therapy are one component of a multi-disciplinary discharge planning process, led by the attending physician.  Recommendations may be updated based on patient status, additional functional criteria and insurance authorization.  Follow Up Recommendations       Assistance Recommended at Discharge PRN  Patient can return home with the following  A little help with bathing/dressing/bathroom;Assistance with cooking/housework;Assist for transportation;Help with stairs or ramp for entrance    Equipment Recommendations None recommended by PT  Recommendations for Other Services       Functional Status Assessment Patient has had a recent decline in their functional status and demonstrates the ability to make significant improvements in function in a reasonable and predictable amount of time.     Precautions / Restrictions Precautions Precautions: Fall;Sternal Precaution Booklet Issued: No Precaution Comments: verbally reviewed sternal precautions Restrictions Weight Bearing Restrictions: No Other Position/Activity Restrictions: sternal precautions      Mobility  Bed Mobility Overal bed mobility: Needs Assistance Bed  Mobility: Rolling, Sidelying to Sit, Sit to Sidelying Rolling: Supervision Sidelying to sit: Supervision     Sit to sidelying: Min guard      Transfers Overall transfer level: Needs assistance Equipment used: Rolling walker (2 wheels) Transfers: Sit to/from Stand Sit to Stand: Min guard                Ambulation/Gait Ambulation/Gait assistance: Supervision Gait Distance (Feet): 300 Feet Assistive device: Rolling walker (2 wheels) Gait Pattern/deviations: Step-through pattern Gait velocity: functional Gait velocity interpretation: 1.31 - 2.62 ft/sec, indicative of limited community ambulator   General Gait Details: slowed step-through gait  Stairs            Wheelchair Mobility    Modified Rankin (Stroke Patients Only)       Balance Overall balance assessment: Needs assistance Sitting-balance support: No upper extremity supported, Feet supported Sitting balance-Leahy Scale: Good     Standing balance support: Single extremity supported, Reliant on assistive device for balance Standing balance-Leahy Scale: Poor                               Pertinent Vitals/Pain Pain Assessment Pain Assessment: No/denies pain    Home Living Family/patient expects to be discharged to:: Private residence Living Arrangements: Spouse/significant other Available Help at Discharge: Family Type of Home: House Home Access: Stairs to enter Entrance Stairs-Rails: Right Entrance Stairs-Number of Steps: 3   Home Layout: One level Home Equipment: Agricultural consultant (2 wheels);Cane - single point      Prior Function Prior Level of Function : Independent/Modified Independent;Driving             Mobility Comments: retired Management consultant, enjoys Archivist  Dominant Hand: Right    Extremity/Trunk Assessment   Upper Extremity Assessment Upper Extremity Assessment: RUE deficits/detail;LUE deficits/detail RUE Deficits / Details: limited  by sternal precautions LUE Deficits / Details: limited by sternal precautions    Lower Extremity Assessment Lower Extremity Assessment: Generalized weakness    Cervical / Trunk Assessment Cervical / Trunk Assessment: Normal  Communication   Communication: No difficulties  Cognition Arousal/Alertness: Awake/alert Behavior During Therapy: WFL for tasks assessed/performed Overall Cognitive Status: Within Functional Limits for tasks assessed                                          General Comments General comments (skin integrity, edema, etc.): VSS on RA    Exercises     Assessment/Plan    PT Assessment Patient needs continued PT services  PT Problem List Decreased strength;Decreased activity tolerance;Decreased balance;Decreased mobility;Decreased knowledge of use of DME       PT Treatment Interventions DME instruction;Gait training;Stair training;Functional mobility training;Therapeutic activities;Therapeutic exercise;Balance training;Neuromuscular re-education;Patient/family education    PT Goals (Current goals can be found in the Care Plan section)  Acute Rehab PT Goals Patient Stated Goal: to return to independence PT Goal Formulation: With patient Time For Goal Achievement: 11/05/22 Potential to Achieve Goals: Good    Frequency Min 3X/week     Co-evaluation               AM-PAC PT "6 Clicks" Mobility  Outcome Measure Help needed turning from your back to your side while in a flat bed without using bedrails?: A Little Help needed moving from lying on your back to sitting on the side of a flat bed without using bedrails?: A Little Help needed moving to and from a bed to a chair (including a wheelchair)?: A Little Help needed standing up from a chair using your arms (e.g., wheelchair or bedside chair)?: A Little Help needed to walk in hospital room?: A Little Help needed climbing 3-5 steps with a railing? : A Little 6 Click Score: 18     End of Session   Activity Tolerance: Patient tolerated treatment well Patient left: in bed;with call bell/phone within reach Nurse Communication: Mobility status PT Visit Diagnosis: Other abnormalities of gait and mobility (R26.89);Muscle weakness (generalized) (M62.81)    Time: 1610-9604 PT Time Calculation (min) (ACUTE ONLY): 20 min   Charges:   PT Evaluation $PT Eval Low Complexity: 1 Low          Arlyss Gandy, PT, DPT Acute Rehabilitation Office [phone]   Arlyss Gandy 10/22/2022, 9:21 AM

## 2022-10-22 NOTE — Progress Notes (Addendum)
[address]       [place] [address]             [phone]      4 Days Post-Op Procedure(s) (LRB): AORTIC VALVE REPLACEMENT (AVR) USING INSPIRIS AORTIC VALVE SIZE (N/A) CORONARY ARTERY BYPASS GRAFTING (CABG) X 1 USING LEFT INTERNAL MAMMARY  ARTERY AND ENDOSCOPICALLY HARVESTED LEFT GREATER SAPHENOUS VEIN (N/A) TRANSESOPHAGEAL ECHOCARDIOGRAM (N/A) Subjective: Pt states he feels good this AM. Did not walk yesterday.  Objective: Vital signs in last 24 hours: Temp:  [98 F (36.7 C)-98.2 F (36.8 C)] 98 F (36.7 C) (05/20 0305) Pulse Rate:  [57-59] 58 (05/20 0305) Cardiac Rhythm: Sinus bradycardia (05/19 1900) Resp:  [10-24] 13 (05/20 0305) BP: (102-133)/(62-77) 119/66 (05/20 0305) SpO2:  [88 %-100 %] 98 % (05/20 0305)  Hemodynamic parameters for last 24 hours:    Intake/Output from previous day: 05/19 0701 - 05/20 0700 In: 371.3 [P.O.:240; I.V.:131.3] Out: 725 [Urine:725] Intake/Output this shift: Total I/O In: 54.6 [I.V.:54.6] Out: -   General appearance: alert, cooperative, and no distress Neurologic: intact Heart: SB-NSR, no murmur Lungs: Diminished bibasilar Abdomen: Active bowel sounds, no distension, nontender Extremities: edema trace Wound: Clean and dry, no erythema or sign of infection  Lab Results: Recent Labs    10/21/22 0115 10/22/22 0209  WBC 9.6 7.7  HGB 8.0* 7.6*  HCT 23.7* 22.3*  PLT 61* 76*   BMET:  Recent Labs    10/21/22 0115 10/22/22 0209  NA 133* 129*  K 4.3 3.7  CL 98 96*  CO2 27 26  GLUCOSE 134* 112*  BUN 33* 32*  CREATININE 1.20 1.25*  CALCIUM 8.3* 7.9*    PT/INR: No results for input(s): "LABPROT", "INR" in the last 72 hours. ABG    Component Value Date/Time   PHART 7.365 10/19/2022 0841   HCO3 22.9 10/19/2022 0841   TCO2 24 10/19/2022 0841   ACIDBASEDEF 2.0 10/19/2022 0841   O2SAT 99 10/19/2022 0841   CBG (last 3)  Recent Labs    10/21/22 1350 10/21/22 1754 10/21/22 2101  GLUCAP  133* 114* 112*    Assessment/Plan: S/P Procedure(s) (LRB): AORTIC VALVE REPLACEMENT (AVR) USING INSPIRIS AORTIC VALVE SIZE (N/A) CORONARY ARTERY BYPASS GRAFTING (CABG) X 1 USING LEFT INTERNAL MAMMARY  ARTERY AND ENDOSCOPICALLY HARVESTED LEFT GREATER SAPHENOUS VEIN (N/A) TRANSESOPHAGEAL ECHOCARDIOGRAM (N/A)  Neuro: Intact, pain controlled this AM  CV: Hx of afib with RVR. SB-NSR. HR 60s this AM. SBP 119. On Amiodarone 400mg  BID and Coreg 3.125 BID.   Pulm: Saturating well on RA. CXR with bibasilar atelectasis. Encourage IS and ambulation.   GI: -BM, passing gas. No nausea this AM. On Reglan and stool softeners.   Endo: CBGs controlled, no hx of DM. Will d/c SSI.  Renal: Cr 1.25. Slightly increased, likely due to diuresis. UO 725cc/24hrs recorded. +6lbs from preop weight. Continue Lasix. K 3.7, supplement. On home Flomax.   Hyponatremia: Na 129, likely due to diuresis. Monitor.  Expected postop ABLA: H/H 7.6/22.3. Close to transfusion threshold, continue iron supplement and monitor.   Thrombocytopenia: Improving, Plt 76,000. Continue ASA 81mg . Holding Lovenox. Ambulation for DVT prophylaxis.   Dispo: Improve ambulation and pending BM before discharge. Hopefully d/c next 48 hours.    LOS: 4 days    Jenny Reichmann, PA-C 10/22/2022   Chart reviewed, patient examined, agree with above. He looks good today. Rhythm is stable in sinus. Had BM today. He can go home tomorrow if he  feels well and no new problems.

## 2022-10-23 ENCOUNTER — Telehealth: Payer: Self-pay

## 2022-10-23 LAB — CBC
HCT: 23.1 % — ABNORMAL LOW (ref 39.0–52.0)
Hemoglobin: 7.9 g/dL — ABNORMAL LOW (ref 13.0–17.0)
MCH: 32.2 pg (ref 26.0–34.0)
MCHC: 34.2 g/dL (ref 30.0–36.0)
MCV: 94.3 fL (ref 80.0–100.0)
Platelets: 106 10*3/uL — ABNORMAL LOW (ref 150–400)
RBC: 2.45 MIL/uL — ABNORMAL LOW (ref 4.22–5.81)
RDW: 14.5 % (ref 11.5–15.5)
WBC: 6.7 10*3/uL (ref 4.0–10.5)
nRBC: 0.4 % — ABNORMAL HIGH (ref 0.0–0.2)

## 2022-10-23 LAB — BASIC METABOLIC PANEL
Anion gap: 7 (ref 5–15)
BUN: 27 mg/dL — ABNORMAL HIGH (ref 8–23)
CO2: 27 mmol/L (ref 22–32)
Calcium: 8.3 mg/dL — ABNORMAL LOW (ref 8.9–10.3)
Chloride: 100 mmol/L (ref 98–111)
Creatinine, Ser: 1.14 mg/dL (ref 0.61–1.24)
GFR, Estimated: >60 mL/min (ref >60)
Glucose, Bld: 105 mg/dL — ABNORMAL HIGH (ref 70–99)
Potassium: 3.8 mmol/L (ref 3.5–5.1)
Sodium: 134 mmol/L — ABNORMAL LOW (ref 135–145)

## 2022-10-23 LAB — GLUCOSE, CAPILLARY: Glucose-Capillary: 106 mg/dL — ABNORMAL HIGH (ref 70–99)

## 2022-10-23 MED ORDER — POTASSIUM CHLORIDE CRYS ER 20 MEQ PO TBCR: 20.0000 meq | EXTENDED_RELEASE_TABLET | Freq: Every day | ORAL | 0 refills | Status: DC

## 2022-10-23 MED ORDER — AMIODARONE HCL 200 MG PO TABS: ORAL_TABLET | ORAL | 1 refills | Status: DC

## 2022-10-23 MED ORDER — AMLODIPINE BESYLATE 5 MG PO TABS
2.5000 mg | ORAL_TABLET | Freq: Every day | ORAL | Status: DC
  Administered 2022-10-23: 2.5 mg via ORAL
  Filled 2022-10-23: qty 1

## 2022-10-23 MED ORDER — AMLODIPINE BESYLATE 2.5 MG PO TABS: 2.5000 mg | ORAL_TABLET | Freq: Every day | ORAL | 1 refills | Status: DC

## 2022-10-23 MED ORDER — POTASSIUM CHLORIDE CRYS ER 20 MEQ PO TBCR
40.0000 meq | EXTENDED_RELEASE_TABLET | Freq: Once | ORAL | Status: AC
  Administered 2022-10-23: 40 meq via ORAL
  Filled 2022-10-23: qty 2

## 2022-10-23 MED ORDER — OXYCODONE HCL 5 MG PO TABS: 5.0000 mg | ORAL_TABLET | Freq: Four times a day (QID) | ORAL | 0 refills | Status: DC | PRN

## 2022-10-23 MED ORDER — FE FUM-VIT C-VIT B12-FA 460-60-0.01-1 MG PO CAPS: 1.0000 | ORAL_CAPSULE | Freq: Every day | ORAL | 0 refills | Status: DC

## 2022-10-23 MED ORDER — CARVEDILOL 3.125 MG PO TABS: 3.1250 mg | ORAL_TABLET | Freq: Two times a day (BID) | ORAL | 1 refills | Status: DC

## 2022-10-23 MED ORDER — FUROSEMIDE 40 MG PO TABS: 40.0000 mg | ORAL_TABLET | Freq: Every day | ORAL | 0 refills | Status: DC

## 2022-10-23 NOTE — Progress Notes (Signed)
Discharge instructions given. Patient verbalize understanding and all questions were answered.

## 2022-10-23 NOTE — Telephone Encounter (Signed)
-----   Message from Davina Poke, RN sent at 10/23/2022  2:58 PM EDT ----- Theodore Patton,   Can one of you please call this patients wife back at [phone] about the med below?  She is saying pharmacy does not have it and wanted to know what to do.  Thanks!!  Fe Fum-Vit C-Vit B12-FA Caps capsule Commonly known as: TRIGELS-F FORTE Take 1 capsule by mouth daily after breakfast. May substitute with similar generic medication

## 2022-10-23 NOTE — Telephone Encounter (Signed)
Patient's wife notified that patient could get over the counter generic vitamins for iron supplement as his pharmacy does not carry this prescription. She also asked if patient's chest tube sutures needed to be covered and if he could take a shower. Advised that they did not need to be covered and he could take a shower. She acknowledged receipt.

## 2022-10-23 NOTE — Progress Notes (Addendum)
      [address]       [place] [address]             [phone]      5 Days Post-Op Procedure(s) (LRB): AORTIC VALVE REPLACEMENT (AVR) USING INSPIRIS AORTIC VALVE SIZE (N/A) CORONARY ARTERY BYPASS GRAFTING (CABG) X 1 USING LEFT INTERNAL MAMMARY  ARTERY AND ENDOSCOPICALLY HARVESTED LEFT GREATER SAPHENOUS VEIN (N/A) TRANSESOPHAGEAL ECHOCARDIOGRAM (N/A) Subjective: Pt with no new complaints this AM. Eager to go home.  Objective: Vital signs in last 24 hours: Temp:  [98.2 F (36.8 C)-98.3 F (36.8 C)] 98.3 F (36.8 C) (05/21 0300) Pulse Rate:  [62-68] 62 (05/20 1900) Cardiac Rhythm: Normal sinus rhythm (05/20 1923) Resp:  [12-20] 20 (05/21 0300) BP: (120-155)/(65-83) 137/76 (05/21 0300) SpO2:  [92 %-95 %] 92 % (05/20 1716) Weight:  [89.8 kg] 89.8 kg (05/21 0300)  Hemodynamic parameters for last 24 hours:    Intake/Output from previous day: 05/20 0701 - 05/21 0700 In: -  Out: 1320 [Urine:1320] Intake/Output this shift: No intake/output data recorded.  General appearance: alert, cooperative, and no distress Neurologic: intact Heart: Regular rate and rhythm, no murmur Lungs: clear to auscultation bilaterally Abdomen: soft, non-tender; bowel sounds normal; no masses,  no organomegaly Extremities: edema trace Wound: Clean and dry without erythema or sign of infection  Lab Results: Recent Labs    10/22/22 0209 10/23/22 0146  WBC 7.7 6.7  HGB 7.6* 7.9*  HCT 22.3* 23.1*  PLT 76* 106*   BMET:  Recent Labs    10/22/22 0209 10/23/22 0146  NA 129* 134*  K 3.7 3.8  CL 96* 100  CO2 26 27  GLUCOSE 112* 105*  BUN 32* 27*  CREATININE 1.25* 1.14  CALCIUM 7.9* 8.3*    PT/INR: No results for input(s): "LABPROT", "INR" in the last 72 hours. ABG    Component Value Date/Time   PHART 7.365 10/19/2022 0841   HCO3 22.9 10/19/2022 0841   TCO2 24 10/19/2022 0841   ACIDBASEDEF 2.0 10/19/2022 0841   O2SAT 99 10/19/2022 0841   CBG (last 3)   Recent Labs    10/21/22 2101 10/22/22 2111 10/23/22 0549  GLUCAP 112* 124* 106*    Assessment/Plan: S/P Procedure(s) (LRB): AORTIC VALVE REPLACEMENT (AVR) USING INSPIRIS AORTIC VALVE SIZE (N/A) CORONARY ARTERY BYPASS GRAFTING (CABG) X 1 USING LEFT INTERNAL MAMMARY  ARTERY AND ENDOSCOPICALLY HARVESTED LEFT GREATER SAPHENOUS VEIN (N/A) TRANSESOPHAGEAL ECHOCARDIOGRAM (N/A)  Neuro: Intact, pain controlled this AM.   CV: Hx of afib with RVR. NSR. HR 60s this AM. SBP 119-150s. Continue Amiodarone 400mg  BID and Coreg 3.125 BID. Start low dose Norvasc.   Pulm: Saturating well on RA. CXR with bibasilar atelectasis. Encourage IS and ambulation.    GI: +BM, good appetite.   Renal: Improved, Cr 1.14. UO 1320cc/24hrs recorded. +8lbs from preop weight if correct. Continue Lasix at d/c. K 3.8, supplement. On home Flomax.    Mild Hyponatremia: Improved, Na 134. Likely due to diuresis.    Expected postop ABLA: Asymptomatic. Improved, H/H 7.9/23.1. Continue iron supplement.    Thrombocytopenia: Improving, Plt 106,000. Continue ASA 81mg . Holding Lovenox. Ambulation for DVT prophylaxis.    Dispo: Plan to discharge to home today.    LOS: 5 days    Jenny Reichmann, PA-C 10/23/2022   Chart reviewed, patient examined, agree with above. He looks good and feels ready to go home.

## 2022-10-23 NOTE — Progress Notes (Signed)
CARDIAC REHAB PHASE I    [facility] home education including site care, home needs at discharge, risk factors, restrictions, sternal precautions, exercise guidelines, heart healthy diet, IS use at home and CRP2 reviewed. All questions and concerns addressed. Will refer to [facility] for CRP2.   [id number]  Woodroe Chen, RN BSN 10/23/2022 9:17 AM

## 2022-10-24 ENCOUNTER — Other Ambulatory Visit: Payer: Self-pay | Admitting: *Deleted

## 2022-10-24 MED ORDER — FE FUM-VIT C-VIT B12-FA 460-60-0.01-1 MG PO CAPS: 1.0000 | ORAL_CAPSULE | Freq: Every day | ORAL | 0 refills | Status: DC

## 2022-10-24 MED FILL — Sodium Chloride IV Soln 0.9%: INTRAVENOUS | Qty: 2000 | Status: AC

## 2022-10-24 MED FILL — Mannitol IV Soln 20%: INTRAVENOUS | Qty: 500 | Status: AC

## 2022-10-24 MED FILL — Calcium Chloride Inj 10%: INTRAVENOUS | Qty: 10 | Status: AC

## 2022-10-24 MED FILL — Heparin Sodium (Porcine) Inj 1000 Unit/ML: Qty: 1000 | Status: AC

## 2022-10-24 MED FILL — Potassium Chloride Inj 2 mEq/ML: INTRAVENOUS | Qty: 40 | Status: AC

## 2022-10-24 MED FILL — Sodium Bicarbonate IV Soln 8.4%: INTRAVENOUS | Qty: 50 | Status: AC

## 2022-10-24 MED FILL — Electrolyte-R (PH 7.4) Solution: INTRAVENOUS | Qty: 3000 | Status: AC

## 2022-10-24 MED FILL — Lidocaine HCl Local Preservative Free (PF) Inj 2%: INTRAMUSCULAR | Qty: 14 | Status: AC

## 2022-10-24 MED FILL — Heparin Sodium (Porcine) Inj 1000 Unit/ML: INTRAMUSCULAR | Qty: 10 | Status: AC

## 2022-10-24 NOTE — Progress Notes (Signed)
Patient's wife contacted the office requesting Trigels-F Forte capsules be sent to [facility] pharmacy as they are able to get supplement from a supplier. RX sent per request. Advised wife that if prescription is unable to be filled to ask the Pharmacist for help locating the appropriate vitamins and strengths that make up Trigels. Wife verbalized understanding.

## 2022-10-30 ENCOUNTER — Ambulatory Visit: Payer: Self-pay

## 2022-10-30 DIAGNOSIS — Z4802 Encounter for removal of sutures: Secondary | ICD-10-CM

## 2022-10-30 NOTE — Progress Notes (Signed)
Patient arrived for nurse visit to remove three sutures post- procedure AVR with Dr. Laneta Simmers 5/16.  Sutures removed with no signs/ symptoms of infection noted.  Patient tolerated procedure well.  Patient/ family instructed to keep the incision sites clean and dry.  Patient/ family acknowledged instructions given.  Patient became faint and diaphoretic when walking to room, BP had dropped. Gave ice pack and water, laid flat, patient felt better and able to proceed with suture removal. Incisions well approximated.

## 2022-11-01 NOTE — Progress Notes (Signed)
Cardiology Office Note:    Date:  11/02/2022   ID:  Theodore Patton, DOB [birth year], MRN [id number]  PCP:  Chilton Greathouse, MD   [facility] Providers Cardiologist:  Armanda Magic, MD     Referring MD: Chilton Greathouse, MD   Chief Complaint: post hospital follow-up  History of Present Illness:    Theodore Patton is a very pleasant 77 y.o. male with a hx of CAD s/p CABG x 1, aortic insufficiency s/p AVR, dilated aortic root, and HLD.   He established care with cardiology in 2019 for evaluation of aortic valve disease and mildly dilated ascending aorta.  Seen on 02/12/2022 for cardiac risk assessment for surgery.  TTE 01/02/2022 with LVEF 60 to 65%, grade 1 DD, restricted motion of the right coronary cusp.  Aortic valve is tricuspid, aortic valve regurgitation is moderate to severe, moderate dilatation of the aortic root measuring 43 mm, mild dilatation of the ascending aorta measuring 38 mm.  Plan for repeat echocardiogram in 6 months.  Repeat echocardiogram 07/26/2022 with LVEF 60 to 65%.  Severe AI and mildly dilated aortic root at 41 mm.  Plan for follow-up TEE to evaluate aortic insufficiency further.  He underwent TEE 08/24/22 which revealed normal LVEF 65 to 70%, no RWMA, normal RV, mildly dilated LA with no LA/LAA appendage thrombus, mild MR, mild prolapse of the left coronary cusp and dilatation of the aortic annulus, aortic valve is tricuspid with mild calcification and mild thickening, AI is severe, AV sclerosis is present with no evidence of stenosis, mild dilatation of the aortic root measuring 41 mm mild (grade II) layered plaque involving the descending aorta  Seen by Jari Favre, PA on 09/21/22 at which time he reported fatigue while doing tasks and feels it may be related to his aortic valve.  He was scheduled for right and left heart catheterization and referral to cardiac surgery for consideration of AVR.   He underwent cardiac catheterization on 09/28/2022 which revealed  moderate to severe multivessel disease consisting of LAD, circumflex, and right coronary artery lesions.  Additional testing of right heart pressures was completed.   Seen by Dr. Laneta Simmers, cardiac surgeon, on 10/10/22 with recommendation to undergo CABG and aortic valve replacement using a bioprosthetic valve.   He presented to [facility] on 10/18/2022 and underwent CABG x 1 utilizing LIMA to LAD and aortic valve replacement utilizing 27 mm Inspiris aortic valve as well as endoscopic harvest of the right greater saphenous vein which was too small to be used as a graft.  He had a relatively uncomplicated postop and was discharged home on 10/23/22.   Today, he is here with his wife Clydie Braun. Reports he is overall doing well. Is gradually increasing activity with walking. Is tired from walking in from the parking lot today but no dyspnea or chest pain. Feels occasional stabbing pains on right side of chest. These are not bothersome. Is not taking pain meds. Feels occasional lightheadedness but no presyncope or syncope. Prior to surgery, he remained active with regular yard work, wood working, and odds and ends at his church. Systolic BP prior to surgery high 120s to 130s mmHg. He has not been monitoring BP at home since the surgery.  He is sleeping well.  Denies orthopnea, PND, palpitations.  Had sutures removed on 5/28 with no concerns for abnormal wound healing. He completed Lasix Rx with no further edema or weight gain. Is anxious to start cardiac rehab.   Past Medical History:  Diagnosis Date  Aortic regurgitation 01/07/2019   severe and eccentric by TEE 32024 with RF 69%.   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, completely asymptomatic   Arthritis    Complication of anesthesia    Coronary artery calcification seen on CAT scan    GERD (gastroesophageal reflux disease)    Gout    Heart murmur    Hyperlipemia    PONV (postoperative nausea and vomiting)     Primary localized osteoarthritis of right knee 04/14/2019   Primary localized osteoarthrosis of right shoulder 07/24/2016    Past Surgical History:  Procedure Laterality Date   AORTIC VALVE REPLACEMENT N/A 10/18/2022   Procedure: AORTIC VALVE REPLACEMENT (AVR) USING INSPIRIS AORTIC VALVE SIZE ;  Surgeon: Alleen Borne, MD;  Location: [facility] OR;  Service: Open Heart Surgery;  Laterality: N/A;   COLONOSCOPY     CORONARY ARTERY BYPASS GRAFT N/A 10/18/2022   Procedure: CORONARY ARTERY BYPASS GRAFTING (CABG) X 1 USING LEFT INTERNAL MAMMARY  ARTERY AND ENDOSCOPICALLY HARVESTED LEFT GREATER SAPHENOUS VEIN;  Surgeon: Alleen Borne, MD;  Location: [facility] OR;  Service: Open Heart Surgery;  Laterality: N/A;   PARTIAL KNEE ARTHROPLASTY Right 04/14/2019   Procedure: UNICOMPARTMENTAL KNEE;  Surgeon: Teryl Lucy, MD;  Location: [facility] ORS;  Service: Orthopedics;  Laterality: Right;   PILONIDAL CYST EXCISION     RIGHT/LEFT HEART CATH AND CORONARY ANGIOGRAPHY N/A 09/28/2022   Procedure: RIGHT/LEFT HEART CATH AND CORONARY ANGIOGRAPHY;  Surgeon: Orbie Pyo, MD;  Location: [facility] INVASIVE CV LAB;  Service: Cardiovascular;  Laterality: N/A;   SHOULDER ARTHROSCOPY Right 2008   TEE WITHOUT CARDIOVERSION N/A 08/24/2022   Procedure: TRANSESOPHAGEAL ECHOCARDIOGRAM (TEE);  Surgeon: Thurmon Fair, MD;  Location: [facility] ENDOSCOPY;  Service: Cardiovascular;  Laterality: N/A;   TEE WITHOUT CARDIOVERSION N/A 10/18/2022   Procedure: TRANSESOPHAGEAL ECHOCARDIOGRAM;  Surgeon: Alleen Borne, MD;  Location: [facility] OR;  Service: Open Heart Surgery;  Laterality: N/A;   TOTAL SHOULDER ARTHROPLASTY Right 07/24/2016   Procedure: TOTAL SHOULDER ARTHROPLASTY;  Surgeon: Teryl Lucy, MD;  Location: [facility] OR;  Service: Orthopedics;  Laterality: Right;   TOTAL SHOULDER ARTHROPLASTY Left 03/06/2022   Procedure: TOTAL SHOULDER ARTHROPLASTY;  Surgeon: Teryl Lucy, MD;  Location: [facility] ORS;  Service: Orthopedics;  Laterality: Left;   UPPER GI ENDOSCOPY       Current Medications: Current Meds  Medication Sig   acetaminophen (TYLENOL) 650 MG CR tablet Take 1,300 mg by mouth 2 (two) times daily.   allopurinol (ZYLOPRIM) 300 MG tablet Take 300 mg by mouth in the morning.   amiodarone (PACERONE) 200 MG tablet Take 200 mg by mouth 2 (two) times daily.   amLODipine (NORVASC) 2.5 MG tablet Take 1 tablet (2.5 mg total) by mouth daily.   aspirin EC 81 MG tablet Take 1 tablet (81 mg total) by mouth daily. Swallow whole.   buPROPion (WELLBUTRIN XL) 150 MG 24 hr tablet Take 300 mg by mouth in the morning.   carvedilol (COREG) 3.125 MG tablet Take 1 tablet (3.125 mg total) by mouth 2 (two) times daily with a meal.   cetirizine (ZYRTEC) 10 MG tablet Take 10 mg by mouth at bedtime.   colchicine 0.6 MG tablet Take 0.6 mg by mouth every 6 (six) hours as needed (gout flares).    famotidine (PEPCID) 20 MG tablet Take 20 mg by mouth at bedtime.   Fe Fum-Vit C-Vit B12-FA (TRIGELS-F FORTE) CAPS capsule Take 1 capsule by mouth daily after breakfast.  May substitute with similar generic medication   fluticasone (FLONASE) 50 MCG/ACT nasal spray Place 1-2 sprays into both nostrils at bedtime as needed for allergies.   gabapentin (NEURONTIN) 300 MG capsule Take 600 mg by mouth 3 (three) times daily.   Glucosamine-Chondroitin (COSAMIN DS PO) Take 1 tablet by mouth 2 (two) times daily. Triple Strength   Multiple Vitamin (MULTIVITAMIN WITH MINERALS) TABS tablet Take 1 tablet by mouth daily. Complete Multivitamin   oxyCODONE (OXY IR/ROXICODONE) 5 MG immediate release tablet Take 1 tablet (5 mg total) by mouth every 6 (six) hours as needed for severe pain.   Probiotic Product (ALIGN) 4 MG CAPS Take 4 mg by mouth daily as needed (gut health).   rosuvastatin (CRESTOR) 10 MG tablet Take 10 mg by mouth in the morning.   SALINE NASAL MIST NA Place 1 spray into the nose 3 (three) times daily as needed (congestion).   tamsulosin (FLOMAX) 0.4 MG CAPS capsule Take 0.8 mg by mouth  every evening.   TURMERIC PO Take 1,000 mg by mouth in the morning and at bedtime.   [DISCONTINUED] amiodarone (PACERONE) 200 MG tablet Take 2 tabs twice per day for 7 days then take 1 tab twice per day for 7 days then take 1 tab daily thereafter   [DISCONTINUED] furosemide (LASIX) 40 MG tablet Take 1 tablet (40 mg total) by mouth daily.   [DISCONTINUED] potassium chloride SA (KLOR-CON M) 20 MEQ tablet Take 1 tablet (20 mEq total) by mouth daily.     Allergies:   Sulfa antibiotics   Social History   Socioeconomic History   Marital status: Married    Spouse name: Not on file   Number of children: Not on file   Years of education: Not on file   Highest education level: Not on file  Occupational History   Not on file  Tobacco Use   Smoking status: Former    Types: Cigarettes    Quit date: 07/12/1980    Years since quitting: 42.3   Smokeless tobacco: Never  Vaping Use   Vaping Use: Never used  Substance and Sexual Activity   Alcohol use: Yes    Alcohol/week: 1.0 standard drink of alcohol    Types: 1 Glasses of wine per week    Comment: occ   Drug use: No   Sexual activity: Not on file  Other Topics Concern   Not on file  Social History Narrative   Not on file   Social Determinants of Health   Financial Resource Strain: Low Risk  (10/19/2022)   Overall Financial Resource Strain (CARDIA)    Difficulty of Paying Living Expenses: Not hard at all  Food Insecurity: No Food Insecurity (10/19/2022)   Hunger Vital Sign    Worried About Running Out of Food in the Last Year: Never true    Ran Out of Food in the Last Year: Never true  Transportation Needs: No Transportation Needs (10/19/2022)   PRAPARE - Administrator, Civil Service (Medical): No    Lack of Transportation (Non-Medical): No  Physical Activity: Not on file  Stress: Not on file  Social Connections: Not on file     Family History: The patient's family history includes Healthy in his sister; Heart Problems  in his sister; Liver cancer in his brother.  ROS:   Please see the history of present illness.    + lightheadedness All other systems reviewed and are negative.  Labs/Other Studies Reviewed:    The following studies  were reviewed today:  [facility] 09/28/22   Prox LAD lesion is 20% stenosed.   Mid LAD-1 lesion is 50% stenosed.   Mid LAD-2 lesion is 50% stenosed.   Mid Cx lesion is 60% stenosed.   Prox RCA lesion is 70% stenosed.   1.  Moderate to severe multivessel disease consisting of LAD, circumflex, and right coronary artery lesions. 2.  Fick cardiac output of 6.7 L/min and Fick cardiac index of 3.1 L/min/m with the following hemodynamics:            Right atrial pressure mean of 2 mmHg            Right ventricular pressure 26/1 with a end-diastolic pressure of 4 mmHg            Wedge pressure mean of 7 mmHg            Pulmonary artery pressure of 26/5 with a mean of 10 mmHg 3.  LVEDP of 8 to 9 mmHg.   Recommendation: Cardiothoracic surgical evaluation.   TEE 08/24/22 1. Left ventricular ejection fraction, by estimation, is 65 to 70%. The  left ventricle has normal function. The left ventricle has no regional  wall motion abnormalities. The left ventricular internal cavity size was  mildly dilated.   2. Right ventricular systolic function is normal. The right ventricular  size is normal. Tricuspid regurgitation signal is inadequate for assessing  PA pressure.   3. Left atrial size was mildly dilated. No left atrial/left atrial  appendage thrombus was detected.   4. The mitral valve is normal in structure. Mild mitral valve  regurgitation.   5. 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 The aortic ejection jet is highly eccentric,  directed at the anterior mitral leaflet. The AR vena contracta is 6 mm.  The regurgitant volume is 104 ml,  regurgitant fraction is 68%. The 3D guided ERO area is 0.56 cm sq. The  pressure half  time of the AR jet is 258 ms. There is holodiastolic  reversal of flow in the descending aorta. The aortic valve is tricuspid.  There is mild calcification of the aortic  valve. There is mild thickening of the aortic valve. Aortic valve  regurgitation is severe. Aortic valve sclerosis is present, with no  evidence of aortic valve stenosis.   6. The aortic annulus is dilated (3.2 cm) and the sinuses of Valsalva are  mildly dilated (4.1 cm) , but the remainder of the aorta is normal in  caliber. Aortic dilatation noted. There is mild dilatation of the aortic  root, measuring 41 mm. There is mild   (Grade II) layered plaque involving the descending aorta.  Echo 07/25/22 1. LVIDd 5.3 cm. Left ventricular ejection fraction, by estimation, is 60  to 65%. The left ventricle has normal function. The left ventricle has no  regional wall motion abnormalities. There is mild left ventricular  hypertrophy. Left ventricular  diastolic parameters are consistent with Grade I diastolic dysfunction  (impaired relaxation).   2. Right ventricular systolic function is normal. The right ventricular  size is normal. Tricuspid regurgitation signal is inadequate for assessing  PA pressure.   3. The mitral valve is abnormal. Trivial mitral valve regurgitation.   4. Eccentric AI jet, posteriorly directed, restricted right coronary  cusp. The aortic valve is tricuspid. Aortic valve regurgitation is severe.  Aortic valve sclerosis/calcification is present, without any evidence of  aortic stenosis.   5.  Aortic dilatation noted. There is mild dilatation of the aortic root,  measuring 41 mm.   Comparison(s): Changes from prior study are noted. 01/02/2022: LVEF 60-65%,  moderate to severe AI, dilated aortic root at 43 mm.   Conclusion(s)/Recommendation(s): Findings concerning for severe eccentric  AI, would consider Transesophageal Echocardiogram as symptoms dictate.     Recent Labs: 10/20/2022: ALT 13 10/21/2022:  Magnesium 2.2 10/23/2022: BUN 27; Creatinine, Ser 1.14; Hemoglobin 7.9; Platelets 106; Potassium 3.8; Sodium 134  Recent Lipid Panel    Component Value Date/Time   CHOL 101 11/13/2021 0000   TRIG 149 11/13/2021 0000   HDL 35 (L) 11/13/2021 0000   CHOLHDL 2.9 11/13/2021 0000   LDLCALC 40 11/13/2021 0000     Risk Assessment/Calculations:           Physical Exam:    VS:  BP 100/66   Pulse 71   Ht 6\' 1"  (1.854 m)   Wt 191 lb 3.2 oz (86.7 kg)   SpO2 95%   BMI 25.23 kg/m     Wt Readings from Last 3 Encounters:  11/02/22 191 lb 3.2 oz (86.7 kg)  10/23/22 197 lb 15.6 oz (89.8 kg)  10/16/22 194 lb 8 oz (88.2 kg)     GEN:  Well nourished, well developed in no acute distress HEENT: Normal NECK: No JVD; No carotid bruits CARDIAC: RRR, no murmurs, rubs, gallops RESPIRATORY:  Clear to auscultation without rales, wheezing or rhonchi  ABDOMEN: Soft, non-tender, non-distended. Sternotomy site is well approximated with no drainage or erythema MUSCULOSKELETAL:  No edema; No deformity. 2+ pedal pulses, equal bilaterally SKIN: Pale. Warm and dry NEUROLOGIC:  Alert and oriented x 3 PSYCHIATRIC:  Normal affect   EKG:  EKG is ordered today.  The ekg ordered today demonstrates normal sinus rhythm at 71 bpm, nonspecific T wave abnormality, consider aterolateral ischemia      Diagnoses:    1. S/P AVR (aortic valve replacement)   2. Nonrheumatic aortic valve insufficiency   3. Essential hypertension   4. Hyperlipidemia LDL goal <50   5. Coronary artery disease involving native coronary artery of native heart without angina pectoris   6. Postoperative atrial fibrillation (HCC)   7. S/P CABG x 1    Assessment and Plan:     Aortic insufficiency s/p AVR: AVR 10/18/22 with bioprosthetic valve. Overall is doing well, gradually increasing activity. No chest pain, palpitations, dyspnea, orthopnea, PND. Continue sternal precautions. Has follow-up appointment with CT surgery on 6/12.  Is  anxious to start cardiac rehab when he is cleared. Encouraged good nutrition and hydration for continued healing.   CAD s/p CABG x 1: CABG x 1 with LIMA to LAD on 5/16.  Cardiac cath 09/28/2022 revealed proximal LAD lesion 20%, mid LAD-1 lesion 50% and mid LAD-2 lesion  50%, mid Cx lesion 60% and proximal RCA lesion 70%. Has occasional stabbing chest pain in right chest, does not sound consistent with angina. No dyspnea, or other symptoms concerning for angina.  He is gradually increasing activity. Encouraged him to continue gradual increase until cleared for cardiac rehab by CT surgery. We discussed heart healthy diet. Encouraged him to concentrate on appropriate caloric intake for now, avoid excess sodium.   Atrial fibrillation: Post op atrial fibrillation. He is taking amiodarone as directed with gradual decrease in dose. Will complete Rx and then stop.  He is not on anticoagulation in the setting of recent surgery. Advised him to notify us if he develops palpitations or other symptoms concerning  for return of A-fib.  Hyperlipidemia LDL goal < 55: LDL 50 on 12/08/21.  We will check lipoprotein a and lipid panel today. Lengthy discussion about testing for his adult children, particularly if Lpa is elevated.  Hypertension: BP is soft. Is having lightheadedness on occasion, no presyncope or syncope.  He has not been monitoring home BP.  Encouraged him to monitor on a consistent basis and report if SBP < 100 or if symptoms worsen. Will continue amlodipine 2.5 mg daily and carvedilol 3.125 mg twice daily at this time. Could consider d/c amlodipine if he develops orthostatic hypotension.     Cardiac Rehabilitation Eligibility Assessment  The patient is ready to start cardiac rehabilitation pending clearance from the cardiac surgeon.        Disposition: Keep your September appointment with Dr. Mayford Knife  Medication Adjustments/Labs and Tests Ordered: Current medicines are reviewed at length with the  patient today.  Concerns regarding medicines are outlined above.  Orders Placed This Encounter  Procedures   Lipid Profile   Lipoprotein A (LPA)   CBC   EKG 12-Lead   No orders of the defined types were placed in this encounter.   Patient Instructions  Medication Instructions:   Your physician recommends that you continue on your current medications as directed. Please refer to the Current Medication list given to you today.   *If you need a refill on your cardiac medications before your next appointment, please call your pharmacy*   Lab Work:  TODAY!!!! LIPID/LIPO A  If you have labs (blood work) drawn today and your tests are completely normal, you will receive your results only by: MyChart Message (if you have MyChart) OR A paper copy in the mail If you have any lab test that is abnormal or we need to change your treatment, we will call you to review the results.   Testing/Procedures:  None ordered.   Follow-Up: At [facility], you and your health needs are our priority.  As part of our continuing mission to provide you with exceptional heart care, we have created designated Provider Care Teams.  These Care Teams include your primary Cardiologist (physician) and Advanced Practice Providers (APPs -  Physician Assistants and Nurse Practitioners) who all work together to provide you with the care you need, when you need it.  We recommend signing up for the patient portal called "MyChart".  Sign up information is provided on this After Visit Summary.  MyChart is used to connect with patients for Virtual Visits (Telemedicine).  Patients are able to view lab/test results, encounter notes, upcoming appointments, etc.  Non-urgent messages can be sent to your provider as well.   To learn more about what you can do with MyChart, go to [email or web address].    Your next appointment:   4 month(s)  Provider:   Armanda Magic, MD        Signed, Levi Aland,  NP  11/02/2022 12:39 PM    [facility]

## 2022-11-02 ENCOUNTER — Ambulatory Visit: Payer: Medicare Other | Attending: Nurse Practitioner | Admitting: Nurse Practitioner

## 2022-11-02 ENCOUNTER — Encounter: Payer: Self-pay | Admitting: Nurse Practitioner

## 2022-11-02 VITALS — BP 100/66 | HR 71 | Ht 73.0 in | Wt 191.2 lb

## 2022-11-02 DIAGNOSIS — Z952 Presence of prosthetic heart valve: Secondary | ICD-10-CM | POA: Diagnosis not present

## 2022-11-02 DIAGNOSIS — E785 Hyperlipidemia, unspecified: Secondary | ICD-10-CM | POA: Diagnosis not present

## 2022-11-02 DIAGNOSIS — I9789 Other postprocedural complications and disorders of the circulatory system, not elsewhere classified: Secondary | ICD-10-CM

## 2022-11-02 DIAGNOSIS — I351 Nonrheumatic aortic (valve) insufficiency: Secondary | ICD-10-CM

## 2022-11-02 DIAGNOSIS — I10 Essential (primary) hypertension: Secondary | ICD-10-CM | POA: Diagnosis not present

## 2022-11-02 DIAGNOSIS — I2510 Atherosclerotic heart disease of native coronary artery without angina pectoris: Secondary | ICD-10-CM

## 2022-11-02 DIAGNOSIS — Z951 Presence of aortocoronary bypass graft: Secondary | ICD-10-CM

## 2022-11-02 DIAGNOSIS — I4891 Unspecified atrial fibrillation: Secondary | ICD-10-CM

## 2022-11-02 NOTE — Patient Instructions (Signed)
Medication Instructions:   Your physician recommends that you continue on your current medications as directed. Please refer to the Current Medication list given to you today.   *If you need a refill on your cardiac medications before your next appointment, please call your pharmacy*   Lab Work:  TODAY!!!! LIPID/LIPO A  If you have labs (blood work) drawn today and your tests are completely normal, you will receive your results only by: MyChart Message (if you have MyChart) OR A paper copy in the mail If you have any lab test that is abnormal or we need to change your treatment, we will call you to review the results.   Testing/Procedures:  None ordered.   Follow-Up: At [facility], you and your health needs are our priority.  As part of our continuing mission to provide you with exceptional heart care, we have created designated Provider Care Teams.  These Care Teams include your primary Cardiologist (physician) and Advanced Practice Providers (APPs -  Physician Assistants and Nurse Practitioners) who all work together to provide you with the care you need, when you need it.  We recommend signing up for the patient portal called "MyChart".  Sign up information is provided on this After Visit Summary.  MyChart is used to connect with patients for Virtual Visits (Telemedicine).  Patients are able to view lab/test results, encounter notes, upcoming appointments, etc.  Non-urgent messages can be sent to your provider as well.   To learn more about what you can do with MyChart, go to [email or web address].    Your next appointment:   4 month(s)  Provider:   Armanda Magic, MD

## 2022-11-03 LAB — CBC
Hemoglobin: 9.7 g/dL — ABNORMAL LOW (ref 13.0–17.7)
MCH: 31.9 pg (ref 26.6–33.0)
RBC: 3.04 x10E6/uL — ABNORMAL LOW (ref 4.14–5.80)

## 2022-11-03 LAB — LIPID PANEL: Cholesterol, Total: 90 mg/dL — ABNORMAL LOW (ref 100–199)

## 2022-11-04 LAB — CBC
Hematocrit: 29.8 % — ABNORMAL LOW (ref 37.5–51.0)
MCHC: 32.6 g/dL (ref 31.5–35.7)
MCV: 98 fL — ABNORMAL HIGH (ref 79–97)
Platelets: 271 10*3/uL (ref 150–450)
RDW: 14.6 % (ref 11.6–15.4)
WBC: 6.6 10*3/uL (ref 3.4–10.8)

## 2022-11-04 LAB — LIPID PANEL
Chol/HDL Ratio: 2.4 ratio (ref 0.0–5.0)
HDL: 37 mg/dL — ABNORMAL LOW (ref >39)
LDL Chol Calc (NIH): 32 mg/dL (ref 0–99)
Triglycerides: 113 mg/dL (ref 0–149)
VLDL Cholesterol Cal: 21 mg/dL (ref 5–40)

## 2022-11-04 LAB — LIPOPROTEIN A (LPA): Lipoprotein (a): 11.8 nmol/L (ref <75.0)

## 2022-11-09 NOTE — Progress Notes (Unsigned)
      [address]       [name] [address]             [phone]    HPI:  Theodore Patton is S/P CABG x 1 and Aortic Valve Replacement.  He required intubation overnight due to apnea.  He was anemic and required transfusion of packed cells and initiation of iron therapy.  He developed Atrial Fibrillation with RVR and required treatment with Amiodarone bolus and drip therapy.  He was started on Norvasc for HTN.  He presents today for hospital discharge follow up.  Current Outpatient Medications  Medication Sig Dispense Refill   acetaminophen (TYLENOL) 650 MG CR tablet Take 1,300 mg by mouth 2 (two) times daily.     allopurinol (ZYLOPRIM) 300 MG tablet Take 300 mg by mouth in the morning.     amiodarone (PACERONE) 200 MG tablet Take 200 mg by mouth 2 (two) times daily.     amLODipine (NORVASC) 2.5 MG tablet Take 1 tablet (2.5 mg total) by mouth daily. 30 tablet 1   aspirin EC 81 MG tablet Take 1 tablet (81 mg total) by mouth daily. Swallow whole. 150 tablet 2   buPROPion (WELLBUTRIN XL) 150 MG 24 hr tablet Take 300 mg by mouth in the morning.     carvedilol (COREG) 3.125 MG tablet Take 1 tablet (3.125 mg total) by mouth 2 (two) times daily with a meal. 60 tablet 1   cetirizine (ZYRTEC) 10 MG tablet Take 10 mg by mouth at bedtime.     colchicine 0.6 MG tablet Take 0.6 mg by mouth every 6 (six) hours as needed (gout flares).      famotidine (PEPCID) 20 MG tablet Take 20 mg by mouth at bedtime.     Fe Fum-Vit C-Vit B12-FA (TRIGELS-F FORTE) CAPS capsule Take 1 capsule by mouth daily after breakfast. May substitute with similar generic medication 30 capsule 0   fluticasone (FLONASE) 50 MCG/ACT nasal spray Place 1-2 sprays into both nostrils at bedtime as needed for allergies.     gabapentin (NEURONTIN) 300 MG capsule Take 600 mg by mouth 3 (three) times daily.     Glucosamine-Chondroitin (COSAMIN DS PO) Take 1 tablet by mouth 2 (two) times daily. Triple Strength     Multiple  Vitamin (MULTIVITAMIN WITH MINERALS) TABS tablet Take 1 tablet by mouth daily. Complete Multivitamin     oxyCODONE (OXY IR/ROXICODONE) 5 MG immediate release tablet Take 1 tablet (5 mg total) by mouth every 6 (six) hours as needed for severe pain. 28 tablet 0   Probiotic Product (ALIGN) 4 MG CAPS Take 4 mg by mouth daily as needed (gut health).     rosuvastatin (CRESTOR) 10 MG tablet Take 10 mg by mouth in the morning.     SALINE NASAL MIST NA Place 1 spray into the nose 3 (three) times daily as needed (congestion).     tamsulosin (FLOMAX) 0.4 MG CAPS capsule Take 0.8 mg by mouth every evening.     TURMERIC PO Take 1,000 mg by mouth in the morning and at bedtime.     No current facility-administered medications for this visit.    Physical Exam: ***  Diagnostic Tests: ***  Impression: ***  Plan: ***   Lowella Dandy, PA-C [facility] [phone]

## 2022-11-09 NOTE — Patient Instructions (Signed)
You are encouraged to enroll and participate in the outpatient cardiac rehab program beginning as soon as practical.   Make every effort to maintain a "heart-healthy" lifestyle with regular physical exercise and adherence to a low-fat, low-carbohydrate diet.  Continue to seek regular follow-up appointments with your primary care physician and/or cardiologist.   You may return to driving an automobile as long as you are no longer requiring oral narcotic pain relievers during the daytime.  It would be wise to start driving only short distances during the daylight and gradually increase from there as you feel comfortable.   You may continue to gradually increase your physical activity as tolerated.  Refrain from any heavy lifting or strenuous use of your arms and shoulders until at least 8 weeks from the time of your surgery, and avoid activities that cause increased pain in your chest on the side of your surgical incision.  Otherwise you may continue to increase activities without any particular limitations.  Increase the intensity and duration of physical activity gradually.  

## 2022-11-13 ENCOUNTER — Other Ambulatory Visit: Payer: Self-pay | Admitting: Surgery

## 2022-11-13 DIAGNOSIS — I351 Nonrheumatic aortic (valve) insufficiency: Secondary | ICD-10-CM

## 2022-11-13 DIAGNOSIS — I2510 Atherosclerotic heart disease of native coronary artery without angina pectoris: Secondary | ICD-10-CM

## 2022-11-14 ENCOUNTER — Ambulatory Visit (INDEPENDENT_AMBULATORY_CARE_PROVIDER_SITE_OTHER): Payer: Self-pay | Admitting: Physician Assistant

## 2022-11-14 ENCOUNTER — Ambulatory Visit
Admission: RE | Admit: 2022-11-14 | Discharge: 2022-11-14 | Disposition: A | Discharge status: Home / Self Care | Payer: Medicare Other | Source: Ambulatory Visit | Attending: Surgery | Admitting: Surgery

## 2022-11-14 VITALS — BP 92/59 | HR 80 | Resp 20 | Ht 73.0 in | Wt 191.0 lb

## 2022-11-14 DIAGNOSIS — I351 Nonrheumatic aortic (valve) insufficiency: Secondary | ICD-10-CM

## 2022-11-14 DIAGNOSIS — I2510 Atherosclerotic heart disease of native coronary artery without angina pectoris: Secondary | ICD-10-CM

## 2022-11-14 DIAGNOSIS — Z09 Encounter for follow-up examination after completed treatment for conditions other than malignant neoplasm: Secondary | ICD-10-CM

## 2022-11-19 ENCOUNTER — Telehealth (HOSPITAL_COMMUNITY): Payer: Self-pay

## 2022-11-19 NOTE — Telephone Encounter (Signed)
Called patient to see if he was interested in participating in the Cardiac Rehab Program. Patient stated yes. Patient will come in for orientation on 11/29/22 @ 10:30AM and will attend the 12:30PM exercise class.   Pensions consultant.

## 2022-11-27 ENCOUNTER — Telehealth (HOSPITAL_COMMUNITY): Payer: Self-pay

## 2022-11-27 NOTE — Telephone Encounter (Signed)
Reviewed with patient the Cardiac Rehab Cardiac Risk [facility] Nursing Assessment. Patient knows office location, and to wear comfortable clothing/closed-toed shoes. Recommended to patient to eat, take medications, and if diabetic, to check blood sugar before coming to classes. Explained orientation may take 2 hours.  

## 2022-11-28 ENCOUNTER — Ambulatory Visit (INDEPENDENT_AMBULATORY_CARE_PROVIDER_SITE_OTHER): Payer: Self-pay | Admitting: Surgical

## 2022-11-28 VITALS — BP 96/60 | HR 85 | Resp 20 | Ht 73.0 in | Wt 189.0 lb

## 2022-11-28 DIAGNOSIS — Z09 Encounter for follow-up examination after completed treatment for conditions other than malignant neoplasm: Secondary | ICD-10-CM

## 2022-11-28 NOTE — Progress Notes (Signed)
[address]       [place] [address]             [phone]      Theodore Patton [facility] Medical Record #[id number] Date of Birth: 09/21/1945  Referring: Quintella Reichert, MD Primary Care: Chilton Greathouse, MD Primary Cardiologist: Armanda Magic, MD   Chief Complaint:   POST OP FOLLOW UP  10/18/2022   Surgeon:  Alleen Borne, MD   First Assistant: Aloha Gell,  PA-C and Gershon Crane, PA-C:      Preoperative Diagnosis:  Severe aortic insufficiency and multi-vessel coronary artery disease     Postoperative Diagnosis:  Same     Procedure:   Median Sternotomy Extracorporeal circulation 3.   Coronary artery bypass grafting x 1   Left internal mammary artery graft to the LAD       4.   Endoscopic vein harvest from the left leg 5.   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  He continues to report that he is overall doing well.  He does have some days where he has some more easy fatigability than others.  In general it has been improving over time.  He is not having any significant chest pain or shortness of breath.  He has some very mild lower extremity edema.  Has had no significant difficulties with his incisions.  He has been seen by cardiology and they discontinued his amlodipine for soft blood pressure.  He has also stopped amiodarone with no recurrence of atrial fibrillation.  He reduced his Coreg dose to once daily on his own but we discussed this and he will resume twice daily dosing.  He is kept a twice daily blood pressure diary and blood pressure has been under very good control with most readings in the 110s to 120s range with occasional outliers into the 130s or upper 90s.      Past Medical History:   Diagnosis Date   Aortic regurgitation 01/07/2019   severe and eccentric by TEE [id number] with RF 69%.   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     allopurinol (ZYLOPRIM) 300 MG tablet Take 300 mg by mouth in the morning.     amiodarone (PACERONE) 200 MG tablet Take 200 mg by mouth daily. Discontinue once prescription runs out     aspirin EC 81 MG tablet Take 1 tablet (81 mg total) by mouth daily. Swallow whole. 150 tablet 2   buPROPion (WELLBUTRIN XL) 150 MG 24 hr tablet Take 300 mg by mouth in the morning.     carvedilol (COREG) 3.125 MG tablet Take 1 tablet (3.125 mg total) by mouth 2 (two) times daily with a meal. 60 tablet 1   cetirizine (ZYRTEC) 10 MG tablet Take 10 mg by mouth at bedtime.     colchicine 0.6 MG tablet Take 0.6 mg by mouth every 6 (six) hours as needed (gout flares).      famotidine (PEPCID) 20 MG tablet Take 20 mg by mouth at bedtime.     Fe Fum-Vit C-Vit B12-FA (TRIGELS-F FORTE)  CAPS capsule Take 1 capsule by mouth daily after breakfast. May substitute with similar generic medication 30 capsule 0   fluticasone (FLONASE) 50 MCG/ACT nasal spray Place 1-2 sprays into both nostrils at bedtime as needed for allergies.     gabapentin (NEURONTIN) 300 MG capsule Take 600 mg by mouth 3 (three) times daily.     Glucosamine-Chondroitin (COSAMIN DS PO) Take 1 tablet by mouth 2 (two) times daily. Triple Strength     Multiple Vitamin (MULTIVITAMIN WITH MINERALS) TABS tablet Take 1 tablet by mouth daily. Complete Multivitamin     Probiotic Product (ALIGN) 4 MG CAPS Take 4 mg by mouth daily as needed (gut health).     rosuvastatin (CRESTOR) 10 MG tablet Take 10 mg by mouth in the morning.     SALINE NASAL MIST NA Place 1 spray into the nose 3 (three) times daily as needed (congestion).     tamsulosin (FLOMAX) 0.4 MG CAPS capsule Take 0.8 mg by mouth every evening.     TURMERIC PO Take 1,000 mg by mouth in the morning and at bedtime.     No current facility-administered medications for this visit.       Physical Exam: There were no vitals taken for this visit.  General appearance: alert, cooperative, and no distress Heart: regular rate and rhythm and no murmur Lungs: clear to auscultation bilaterally Extremities: Trace ankle edema Wound: Incisions well-healed without evidence of infection   Diagnostic Studies & Laboratory data:     Recent Radiology Findings:   No results found.    Recent Lab Findings: Lab Results  Component Value Date   WBC 6.6 11/02/2022   HGB 9.7 (L) 11/02/2022   HCT 29.8 (L) 11/02/2022   PLT 271 11/02/2022   GLUCOSE 105 (H) 10/23/2022   CHOL 90 (L) 11/02/2022   TRIG 113 11/02/2022   HDL 37 (L) 11/02/2022   LDLCALC 32 11/02/2022   ALT 13 10/20/2022   AST 31 10/20/2022   NA 134 (L) 10/23/2022   K 3.8 10/23/2022   CL 100 10/23/2022   CREATININE 1.14 10/23/2022   BUN 27 (H) 10/23/2022   CO2 27 10/23/2022   INR 1.6 (H) 10/18/2022   HGBA1C  5.5 10/16/2022      Assessment / Plan: He continues to do well.  His blood pressure good control.  He is scheduled for an echocardiogram next week.  He is scheduled to start cardiac rehab tomorrow.  I did not make any further changes to his current medication regimen.  He will continue ongoing follow-up with primary and cardiology.  We will see the patient again on a as needed basis for any surgically related needs or at request.      Medication Changes: No orders of the defined types were placed in this encounter.     Rowe Clack, PA-C  11/28/2022 1:18 PM

## 2022-11-28 NOTE — Patient Instructions (Signed)
Activity/driving progression discussed including encouraging cardiac rehab realities.

## 2022-11-29 ENCOUNTER — Encounter (HOSPITAL_COMMUNITY)
Admission: RE | Admit: 2022-11-29 | Discharge: 2022-11-29 | Disposition: A | Discharge status: Home / Self Care | Payer: Medicare Other | Source: Ambulatory Visit | Attending: Cardiology | Admitting: Cardiology

## 2022-11-29 VITALS — BP 116/80 | HR 79 | Ht 71.75 in | Wt 190.5 lb

## 2022-11-29 DIAGNOSIS — Z952 Presence of prosthetic heart valve: Secondary | ICD-10-CM | POA: Insufficient documentation

## 2022-11-29 DIAGNOSIS — Z951 Presence of aortocoronary bypass graft: Secondary | ICD-10-CM | POA: Insufficient documentation

## 2022-11-29 NOTE — Progress Notes (Signed)
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 Swallow whole., Disp: 150 tablet, Rfl: 2   buPROPion (WELLBUTRIN XL) 150 MG 24 hr tablet, Take 300 mg by mouth in the morning., Disp: , Rfl:    carvedilol (COREG) 3.125 MG tablet, Take 1 tablet (3.125 mg total) by mouth 2 (two) times daily with a meal., Disp: 60 tablet, Rfl: 1   cetirizine (ZYRTEC) 10 MG tablet, Take 10 mg by mouth at bedtime., Disp: , Rfl:    colchicine 0.6 MG tablet, Take 0.6 mg by mouth every 6 (six) hours as needed (gout flares). , Disp: , Rfl:    famotidine (PEPCID) 20 MG tablet, Take 20 mg by mouth at bedtime., Disp: , Rfl:    fluticasone (FLONASE) 50 MCG/ACT nasal spray, Place 1-2 sprays into both nostrils at bedtime as needed for allergies., Disp: , Rfl:    gabapentin (NEURONTIN) 300 MG capsule, Take 600 mg by mouth 3 (three) times daily., Disp: , Rfl:    Glucosamine-Chondroitin (COSAMIN DS PO), Take 1 tablet by mouth 2 (two) times daily. Triple Strength, Disp: , Rfl:    Multiple Vitamin (MULTIVITAMIN WITH MINERALS) TABS tablet, Take 1 tablet by mouth  daily. Complete Multivitamin, Disp: , Rfl:    Probiotic Product (ALIGN) 4 MG CAPS, Take 4 mg by mouth daily as needed (gut health)., Disp: , Rfl:    rosuvastatin (CRESTOR) 10 MG tablet, Take 10 mg by mouth in the morning., Disp: , Rfl:    SALINE NASAL MIST NA, Place 1 spray into the nose 3 (three) times daily as needed (congestion)., Disp: , Rfl:    tamsulosin (FLOMAX) 0.4 MG CAPS capsule, Take 0.8 mg by mouth every evening., Disp: , Rfl:    TURMERIC PO, Take 1,000 mg by mouth in the morning and at bedtime., Disp: , Rfl:    amiodarone (PACERONE) 200 MG tablet, Take 200 mg by mouth daily. Discontinue once prescription runs out (Patient not taking: Reported on 11/29/2022), Disp: , Rfl:    Fe Fum-Vit C-Vit B12-FA (TRIGELS-F FORTE) CAPS capsule, Take 1 capsule by mouth daily after breakfast. May substitute with similar generic medication (Patient not taking: Reported on 11/29/2022), Disp: 30 capsule, Rfl: 0  Past Medical History: Past Medical History:  Diagnosis Date   Aortic regurgitation 01/07/2019   severe and eccentric by TEE [id number] with RF 69%.   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    Tobacco Use: Social History   Tobacco Use  Smoking Status Former   Types: Cigarettes   Quit date: 07/12/1980   Years since quitting: 42.4  Smokeless Tobacco Never    Labs: Review Flowsheet  More data exists      Latest Ref Rng & Units 09/28/2022 10/16/2022 10/18/2022 10/19/2022 11/02/2022  Labs for ITP Cardiac and Pulmonary Rehab  Cholestrol 100 - 199 mg/dL - - - - 90   LDL (calc) 0 - 99  mg/dL - - - - 32   HDL-C >40 mg/dL - - - - 37   Trlycerides 0 - 149 mg/dL - - - - 981   Hemoglobin A1c 4.8 - 5.6 % - 5.5  - - -  PH, Arterial 7.35 - 7.45 7.326  7.4  7.411  7.396  7.335  7.371  7.365  7.390  -  PCO2 arterial 32 - 48 mmHg 46.3  35  32.8  35.9  41.7  42.2  40.2  38.7  -  Bicarbonate 20.0 - 28.0 mmol/L 23.1  24.4  24.2  21.7  21.2  22.0  22.2  24.4  22.9  23.2  -  TCO2 22 - 32 mmol/L 24  26  26   - 22  22  23  24  24  23  26  26  27  24  24   -  Acid-base deficit 0.0 - 2.0 mmol/L 3.0  2.0  2.0  2.5  3.0  3.0  3.0  1.0  2.0  1.0  -  O2 Saturation % 67  69  93  98  97  99  100  100  99  99  -    Capillary Blood Glucose: Lab Results  Component Value Date   GLUCAP 106 (H) 10/23/2022   GLUCAP 124 (H) 10/22/2022   GLUCAP 112 (H) 10/21/2022   GLUCAP 114 (H) 10/21/2022   GLUCAP 133 (H) 10/21/2022     Exercise Target Goals: Exercise Program Goal: Individual exercise prescription set using results from initial 6 min walk test and THRR while considering  patient's activity barriers and safety.   Exercise Prescription Goal: Initial exercise prescription builds to 30-45 minutes a day of aerobic activity, 2-3 days per week.  Home exercise guidelines will be given to patient during program as part of exercise prescription that the participant will acknowledge.  Activity Barriers & Risk Stratification:  Activity Barriers & Cardiac Risk Stratification - 11/29/22 1224       Activity Barriers & Cardiac Risk Stratification   Activity Barriers Arthritis;Back Problems;Right Knee Replacement;Joint Problems;Deconditioning;Muscular Weakness;Balance Concerns;Other (comment)    Comments Sternal Precautions    Cardiac Risk Stratification High             6 Minute Walk:  6 Minute Walk     Row Name 11/29/22 1142         6 Minute Walk   Phase Initial  Used Go-cart     Distance 1233 feet     Walk Time 6 minutes     # of Rest Breaks 0     MPH 2.34     METS 2.45     RPE 10  Perceived Dyspnea  0     VO2 Peak 8.6     Symptoms No     Resting HR 75 bpm     Resting BP 116/80     Resting Oxygen Saturation  98 %     Exercise Oxygen Saturation  during 6 min walk 98 %     Max Ex. HR 90 bpm     Max Ex. BP 110/70     2 Minute Post BP 120/80              Oxygen Initial Assessment:   Oxygen Re-Evaluation:   Oxygen Discharge (Final Oxygen Re-Evaluation):   Initial Exercise Prescription:  Initial Exercise Prescription - 11/29/22 1200       Date of Initial Exercise RX and Referring Provider   Date 11/29/22    Referring Provider Armanda Magic, MD    Expected Discharge Date 02/08/23      NuStep   Level 1    SPM 80    Minutes 15    METs 2.45      Arm Ergometer   Level 1    Watts 25    RPM 60    Minutes 15    METs 2.45      Prescription Details   Frequency (times per week) 3    Duration Progress to 30 minutes of continuous aerobic without signs/symptoms of physical distress      Intensity   THRR 40-80% of Max Heartrate 58-116    Ratings of Perceived Exertion 11-13    Perceived Dyspnea 0-4      Progression   Progression Continue progressive overload as per policy without signs/symptoms or physical distress.      Resistance Training   Training Prescription Yes    Weight 3 lbs    Reps 10-15             Perform Capillary Blood Glucose checks as needed.  Exercise Prescription Changes:   Exercise Comments:   Exercise Goals and Review:   Exercise Goals     Row Name 11/29/22 1214             Exercise Goals   Increase Physical Activity Yes       Intervention Provide advice, education, support and counseling about physical activity/exercise needs.;Develop an individualized exercise prescription for aerobic and resistive training based on initial evaluation findings, risk stratification, comorbidities and participant's personal goals.       Expected Outcomes Short Term: Attend rehab on a regular basis to increase amount of  physical activity.;Long Term: Add in home exercise to make exercise part of routine and to increase amount of physical activity.;Long Term: Exercising regularly at least 3-5 days a week.       Increase Strength and Stamina Yes       Intervention Provide advice, education, support and counseling about physical activity/exercise needs.;Develop an individualized exercise prescription for aerobic and resistive training based on initial evaluation findings, risk stratification, comorbidities and participant's personal goals.       Expected Outcomes Short Term: Increase workloads from initial exercise prescription for resistance, speed, and METs.;Short Term: Perform resistance training exercises routinely during rehab and add in resistance training at home;Long Term: Improve cardiorespiratory fitness, muscular endurance and strength as measured by increased METs and functional capacity ( )       Able to understand and use rate of perceived exertion (RPE) scale Yes       Intervention Provide education and explanation on how to use RPE  scale       Expected Outcomes Short Term: Able to use RPE daily in rehab to express subjective intensity level;Long Term:  Able to use RPE to guide intensity level when exercising independently       Knowledge and understanding of Target Heart Rate Range (THRR) Yes       Intervention Provide education and explanation of THRR including how the numbers were predicted and where they are located for reference       Expected Outcomes Short Term: Able to state/look up THRR;Short Term: Able to use daily as guideline for intensity in rehab;Long Term: Able to use THRR to govern intensity when exercising independently       Understanding of Exercise Prescription Yes       Intervention Provide education, explanation, and written materials on patient's individual exercise prescription       Expected Outcomes Short Term: Able to explain program exercise prescription;Long Term: Able to  explain home exercise prescription to exercise independently                Exercise Goals Re-Evaluation :   Discharge Exercise Prescription (Final Exercise Prescription Changes):   Nutrition:  Target Goals: Understanding of nutrition guidelines, daily intake of sodium 1500mg , cholesterol 200mg , calories 30% from fat and 7% or less from saturated fats, daily to have 5 or more servings of fruits and vegetables.  Biometrics:  Pre Biometrics - 11/29/22 1055       Pre Biometrics   Waist Circumference 40.5 inches    Hip Circumference 40 inches    Waist to Hip Ratio 1.01 %    Triceps Skinfold 12 mm    Grip Strength 42 kg    Flexibility 0 in   could not reach   Single Leg Stand 2 seconds             Post Biometrics - 11/29/22 1055        Post  Biometrics   % Body Fat 26.2 %             Nutrition Therapy Plan and Nutrition Goals:   Nutrition Assessments:  MEDIFICTS Score Key: ?70 Need to make dietary changes  40-70 Heart Healthy Diet ? 40 Therapeutic Level Cholesterol Diet    Picture Your Plate Scores: <36 Unhealthy dietary pattern with much room for improvement. 41-50 Dietary pattern unlikely to meet recommendations for good health and room for improvement. 51-60 More healthful dietary pattern, with some room for improvement.  >60 Healthy dietary pattern, although there may be some specific behaviors that could be improved.    Nutrition Goals Re-Evaluation:   Nutrition Goals Re-Evaluation:   Nutrition Goals Discharge (Final Nutrition Goals Re-Evaluation):   Psychosocial: Target Goals: Acknowledge presence or absence of significant depression and/or stress, maximize coping skills, provide positive support system. Participant is able to verbalize types and ability to use techniques and skills needed for reducing stress and depression.  Initial Review & Psychosocial Screening:  Initial Psych Review & Screening - 11/29/22 1225       Initial  Review   Current issues with Current Sleep Concerns;History of Depression   Voices he is not currently depressed. Pt feels his Wellbutrin is working well for him.     Family Dynamics   Good Support System? Yes   Has spouse for support     Barriers   Psychosocial barriers to participate in program There are no identifiable barriers or psychosocial needs.      Screening Interventions  Interventions Encouraged to exercise             Quality of Life Scores:  Quality of Life - 11/29/22 1349       Quality of Life   Select Quality of Life      Quality of Life Scores   Health/Function Pre 21 %    Socioeconomic Pre 22.14 %    Psych/Spiritual Pre 24 %    Family Pre 20.5 %    GLOBAL Pre 21.78 %            Scores of 19 and below usually indicate a poorer quality of life in these areas.  A difference of  2-3 points is a clinically meaningful difference.  A difference of 2-3 points in the total score of the Quality of Life Index has been associated with significant improvement in overall quality of life, self-image, physical symptoms, and general health in studies assessing change in quality of life.  PHQ-9: Review Flowsheet       11/29/2022  Depression screen PHQ 2/9  Decreased Interest 1  Down, Depressed, Hopeless 0  PHQ - 2 Score 1  Altered sleeping 1  Tired, decreased energy 1  Change in appetite 0  Feeling bad or failure about yourself  0  Trouble concentrating 2  Moving slowly or fidgety/restless 2  Suicidal thoughts 0  PHQ-9 Score 7  Difficult doing work/chores Somewhat difficult   Interpretation of Total Score  Total Score Depression Severity:  1-4 = Minimal depression, 5-9 = Mild depression, 10-14 = Moderate depression, 15-19 = Moderately severe depression, 20-27 = Severe depression   Psychosocial Evaluation and Intervention:   Psychosocial Re-Evaluation:   Psychosocial Discharge (Final Psychosocial Re-Evaluation):   Vocational  Rehabilitation: Provide vocational rehab assistance to qualifying candidates.   Vocational Rehab Evaluation & Intervention:  Vocational Rehab - 11/29/22 1234       Initial Vocational Rehab Evaluation & Intervention   Assessment shows need for Vocational Rehabilitation No   Pt is retired            Education: Education Goals: Education classes will be provided on a weekly basis, covering required topics. Participant will state understanding/return demonstration of topics presented.     Core Videos: Exercise    Move It!  Clinical staff conducted group or individual video education with verbal and written material and guidebook.  Patient learns the recommended Pritikin exercise program. Exercise with the goal of living a long, healthy life. Some of the health benefits of exercise include controlled diabetes, healthier blood pressure levels, improved cholesterol levels, improved heart and lung capacity, improved sleep, and better body composition. Everyone should speak with their doctor before starting or changing an exercise routine.  Biomechanical Limitations Clinical staff conducted group or individual video education with verbal and written material and guidebook.  Patient learns how biomechanical limitations can impact exercise and how we can mitigate and possibly overcome limitations to have an impactful and balanced exercise routine.  Body Composition Clinical staff conducted group or individual video education with verbal and written material and guidebook.  Patient learns that body composition (ratio of muscle mass to fat mass) is a key component to assessing overall fitness, rather than body weight alone. Increased fat mass, especially visceral belly fat, can put [place] at increased risk for metabolic syndrome, type 2 diabetes, heart disease, and even death. It is recommended to combine diet and exercise (cardiovascular and resistance training) to improve your body composition.  Seek guidance from your physician and  exercise physiologist before implementing an exercise routine.  Exercise Action Plan Clinical staff conducted group or individual video education with verbal and written material and guidebook.  Patient learns the recommended strategies to achieve and enjoy long-term exercise adherence, including variety, self-motivation, self-efficacy, and positive decision making. Benefits of exercise include fitness, good health, weight management, more energy, better sleep, less stress, and overall well-being.  Medical   Heart Disease Risk Reduction Clinical staff conducted group or individual video education with verbal and written material and guidebook.  Patient learns our heart is our most vital organ as it circulates oxygen, nutrients, white blood cells, and hormones throughout the entire body, and carries waste away. Data supports a plant-based eating plan like the Pritikin Program for its effectiveness in slowing progression of and reversing heart disease. The video provides a number of recommendations to address heart disease.   Metabolic Syndrome and Belly Fat  Clinical staff conducted group or individual video education with verbal and written material and guidebook.  Patient learns what metabolic syndrome is, how it leads to heart disease, and how one can reverse it and keep it from coming back. You have metabolic syndrome if you have 3 of the following 5 criteria: abdominal obesity, high blood pressure, high triglycerides, low HDL cholesterol, and high blood sugar.  Hypertension and Heart Disease Clinical staff conducted group or individual video education with verbal and written material and guidebook.  Patient learns that high blood pressure, or hypertension, is very common in the [place]. Hypertension is largely due to excessive salt intake, but other important risk factors include being overweight, physical inactivity, drinking too much alcohol,  smoking, and not eating enough potassium from fruits and vegetables. High blood pressure is a leading risk factor for heart attack, stroke, congestive heart failure, dementia, kidney failure, and premature death. Long-term effects of excessive salt intake include stiffening of the arteries and thickening of heart muscle and organ damage. Recommendations include ways to reduce hypertension and the risk of heart disease.  Diseases of Our Time - Focusing on Diabetes Clinical staff conducted group or individual video education with verbal and written material and guidebook.  Patient learns why the best way to stop diseases of our time is prevention, through food and other lifestyle changes. Medicine (such as prescription pills and surgeries) is often only a Band-Aid on the problem, not a long-term solution. Most common diseases of our time include obesity, type 2 diabetes, hypertension, heart disease, and cancer. The Pritikin Program is recommended and has been proven to help reduce, reverse, and/or prevent the damaging effects of metabolic syndrome.  Nutrition   Overview of the Pritikin Eating Plan  Clinical staff conducted group or individual video education with verbal and written material and guidebook.  Patient learns about the Pritikin Eating Plan for disease risk reduction. The Pritikin Eating Plan emphasizes a wide variety of unrefined, minimally-processed carbohydrates, like fruits, vegetables, whole grains, and legumes. Go, Caution, and Stop food choices are explained. Plant-based and lean animal proteins are emphasized. Rationale provided for low sodium intake for blood pressure control, low added sugars for blood sugar stabilization, and low added fats and oils for coronary artery disease risk reduction and weight management.  Calorie Density  Clinical staff conducted group or individual video education with verbal and written material and guidebook.  Patient learns about calorie density and how  it impacts the Pritikin Eating Plan. Knowing the characteristics of the food you choose will help you decide whether those foods will lead  to weight gain or weight loss, and whether you want to consume more or less of them. Weight loss is usually a side effect of the Pritikin Eating Plan because of its focus on low calorie-dense foods.  Label Reading  Clinical staff conducted group or individual video education with verbal and written material and guidebook.  Patient learns about the Pritikin recommended label reading guidelines and corresponding recommendations regarding calorie density, added sugars, sodium content, and whole grains.  Dining Out - Part 1  Clinical staff conducted group or individual video education with verbal and written material and guidebook.  Patient learns that restaurant meals can be sabotaging because they can be so high in calories, fat, sodium, and/or sugar. Patient learns recommended strategies on how to positively address this and avoid unhealthy pitfalls.  Facts on Fats  Clinical staff conducted group or individual video education with verbal and written material and guidebook.  Patient learns that lifestyle modifications can be just as effective, if not more so, as many medications for lowering your risk of heart disease. A Pritikin lifestyle can help to reduce your risk of inflammation and atherosclerosis (cholesterol build-up, or plaque, in the artery walls). Lifestyle interventions such as dietary choices and physical activity address the cause of atherosclerosis. A review of the types of fats and their impact on blood cholesterol levels, along with dietary recommendations to reduce fat intake is also included.  Nutrition Action Plan  Clinical staff conducted group or individual video education with verbal and written material and guidebook.  Patient learns how to incorporate Pritikin recommendations into their lifestyle. Recommendations include planning and keeping  personal health goals in mind as an important part of their success.  Healthy Mind-Set    Healthy Minds, Bodies, Hearts  Clinical staff conducted group or individual video education with verbal and written material and guidebook.  Patient learns how to identify when they are stressed. Video will discuss the impact of that stress, as well as the many benefits of stress management. Patient will also be introduced to stress management techniques. The way we think, act, and feel has an impact on our hearts.  How Our Thoughts Can Heal Our Hearts  Clinical staff conducted group or individual video education with verbal and written material and guidebook.  Patient learns that negative thoughts can cause depression and anxiety. This can result in negative lifestyle behavior and serious health problems. Cognitive behavioral therapy is an effective method to help control our thoughts in order to change and improve our emotional outlook.  Additional Videos:  Exercise    Improving Performance  Clinical staff conducted group or individual video education with verbal and written material and guidebook.  Patient learns to use a non-linear approach by alternating intensity levels and lengths of time spent exercising to help burn more calories and lose more body fat. Cardiovascular exercise helps improve heart health, metabolism, hormonal balance, blood sugar control, and recovery from fatigue. Resistance training improves strength, endurance, balance, coordination, reaction time, metabolism, and muscle mass. Flexibility exercise improves circulation, posture, and balance. Seek guidance from your physician and exercise physiologist before implementing an exercise routine and learn your capabilities and proper form for all exercise.  Introduction to Yoga  Clinical staff conducted group or individual video education with verbal and written material and guidebook.  Patient learns about yoga, a discipline of the  coming together of mind, breath, and body. The benefits of yoga include improved flexibility, improved range of motion, better posture and core strength, increased lung  function, weight loss, and positive self-image. Yoga's heart health benefits include lowered blood pressure, healthier heart rate, decreased cholesterol and triglyceride levels, improved immune function, and reduced stress. Seek guidance from your physician and exercise physiologist before implementing an exercise routine and learn your capabilities and proper form for all exercise.  Medical   Aging: Enhancing Your Quality of Life  Clinical staff conducted group or individual video education with verbal and written material and guidebook.  Patient learns key strategies and recommendations to stay in good physical health and enhance quality of life, such as prevention strategies, having an advocate, securing a Health Care Proxy and Power of Attorney, and keeping a list of medications and system for tracking them. It also discusses how to avoid risk for bone loss.  Biology of Weight Control  Clinical staff conducted group or individual video education with verbal and written material and guidebook.  Patient learns that weight gain occurs because we consume more calories than we burn (eating more, moving less). Even if your body weight is normal, you may have higher ratios of fat compared to muscle mass. Too much body fat puts you at increased risk for cardiovascular disease, heart attack, stroke, type 2 diabetes, and obesity-related cancers. In addition to exercise, following the Pritikin Eating Plan can help reduce your risk.  Decoding Lab Results  Clinical staff conducted group or individual video education with verbal and written material and guidebook.  Patient learns that lab test reflects one measurement whose values change over time and are influenced by many factors, including medication, stress, sleep, exercise, food, hydration,  pre-existing medical conditions, and more. It is recommended to use the knowledge from this video to become more involved with your lab results and evaluate your numbers to speak with your doctor.   Diseases of Our Time - Overview  Clinical staff conducted group or individual video education with verbal and written material and guidebook.  Patient learns that according to the CDC, 50% to 70% of chronic diseases (such as obesity, type 2 diabetes, elevated lipids, hypertension, and heart disease) are avoidable through lifestyle improvements including healthier food choices, listening to satiety cues, and increased physical activity.  Sleep Disorders Clinical staff conducted group or individual video education with verbal and written material and guidebook.  Patient learns how good quality and duration of sleep are important to overall health and well-being. Patient also learns about sleep disorders and how they impact health along with recommendations to address them, including discussing with a physician.  Nutrition  Dining Out - Part 2 Clinical staff conducted group or individual video education with verbal and written material and guidebook.  Patient learns how to plan ahead and communicate in order to maximize their dining experience in a healthy and nutritious manner. Included are recommended food choices based on the type of restaurant the patient is visiting.   Fueling a Banker conducted group or individual video education with verbal and written material and guidebook.  There is a strong connection between our food choices and our health. Diseases like obesity and type 2 diabetes are very prevalent and are in large-part due to lifestyle choices. The Pritikin Eating Plan provides plenty of food and hunger-curbing satisfaction. It is easy to follow, affordable, and helps reduce health risks.  Menu Workshop  Clinical staff conducted group or individual video education  with verbal and written material and guidebook.  Patient learns that restaurant meals can sabotage health goals because they are often packed with  calories, fat, sodium, and sugar. Recommendations include strategies to plan ahead and to communicate with the manager, chef, or server to help order a healthier meal.  Planning Your Eating Strategy  Clinical staff conducted group or individual video education with verbal and written material and guidebook.  Patient learns about the Pritikin Eating Plan and its benefit of reducing the risk of disease. The Pritikin Eating Plan does not focus on calories. Instead, it emphasizes high-quality, nutrient-rich foods. By knowing the characteristics of the foods, we choose, we can determine their calorie density and make informed decisions.  Targeting Your Nutrition Priorities  Clinical staff conducted group or individual video education with verbal and written material and guidebook.  Patient learns that lifestyle habits have a tremendous impact on disease risk and progression. This video provides eating and physical activity recommendations based on your personal health goals, such as reducing LDL cholesterol, losing weight, preventing or controlling type 2 diabetes, and reducing high blood pressure.  Vitamins and Minerals  Clinical staff conducted group or individual video education with verbal and written material and guidebook.  Patient learns different ways to obtain key vitamins and minerals, including through a recommended healthy diet. It is important to discuss all supplements you take with your doctor.   Healthy Mind-Set    Smoking Cessation  Clinical staff conducted group or individual video education with verbal and written material and guidebook.  Patient learns that cigarette smoking and tobacco addiction pose a serious health risk which affects millions of people. Stopping smoking will significantly reduce the risk of heart disease, lung disease,  and many forms of cancer. Recommended strategies for quitting are covered, including working with your doctor to develop a successful plan.  Culinary   Becoming a Set designer conducted group or individual video education with verbal and written material and guidebook.  Patient learns that cooking at home can be healthy, cost-effective, quick, and puts them in control. Keys to cooking healthy recipes will include looking at your recipe, assessing your equipment needs, planning ahead, making it simple, choosing cost-effective seasonal ingredients, and limiting the use of added fats, salts, and sugars.  Cooking - Breakfast and Snacks  Clinical staff conducted group or individual video education with verbal and written material and guidebook.  Patient learns how important breakfast is to satiety and nutrition through the entire day. Recommendations include key foods to eat during breakfast to help stabilize blood sugar levels and to prevent overeating at meals later in the day. Planning ahead is also a key component.  Cooking - Educational psychologist conducted group or individual video education with verbal and written material and guidebook.  Patient learns eating strategies to improve overall health, including an approach to cook more at home. Recommendations include thinking of animal protein as a side on your plate rather than center stage and focusing instead on lower calorie dense options like vegetables, fruits, whole grains, and plant-based proteins, such as beans. Making sauces in large quantities to freeze for later and leaving the skin on your vegetables are also recommended to maximize your experience.  Cooking - Healthy Salads and Dressing Clinical staff conducted group or individual video education with verbal and written material and guidebook.  Patient learns that vegetables, fruits, whole grains, and legumes are the foundations of the Pritikin Eating Plan.  Recommendations include how to incorporate each of these in flavorful and healthy salads, and how to create homemade salad dressings. Proper handling of ingredients is also  covered. Cooking - Soups and [employer] - Soups and Desserts Clinical staff conducted group or individual video education with verbal and written material and guidebook.  Patient learns that Pritikin soups and desserts make for easy, nutritious, and delicious snacks and meal components that are low in sodium, fat, sugar, and calorie density, while high in vitamins, minerals, and filling fiber. Recommendations include simple and healthy ideas for soups and desserts.   Overview     The Pritikin Solution Program Overview Clinical staff conducted group or individual video education with verbal and written material and guidebook.  Patient learns that the results of the Pritikin Program have been documented in more than 100 articles published in peer-reviewed journals, and the benefits include reducing risk factors for (and, in some cases, even reversing) high cholesterol, high blood pressure, type 2 diabetes, obesity, and more! An overview of the three key pillars of the Pritikin Program will be covered: eating well, doing regular exercise, and having a healthy mind-set.  WORKSHOPS  Exercise: Exercise Basics: Building Your Action Plan Clinical staff led group instruction and group discussion with PowerPoint presentation and patient guidebook. To enhance the learning environment the use of posters, models and videos may be added. At the conclusion of this workshop, patients will comprehend the difference between physical activity and exercise, as well as the benefits of incorporating both, into their routine. Patients will understand the FITT (Frequency, Intensity, Time, and Type) principle and how to use it to build an exercise action plan. In addition, safety concerns and other considerations for exercise and cardiac rehab  will be addressed by the presenter. The purpose of this lesson is to promote a comprehensive and effective weekly exercise routine in order to improve patients' overall level of fitness.   Managing Heart Disease: Your Path to a Healthier Heart Clinical staff led group instruction and group discussion with PowerPoint presentation and patient guidebook. To enhance the learning environment the use of posters, models and videos may be added.At the conclusion of this workshop, patients will understand the anatomy and physiology of the heart. Additionally, they will understand how Pritikin's three pillars impact the risk factors, the progression, and the management of heart disease.  The purpose of this lesson is to provide a high-level overview of the heart, heart disease, and how the Pritikin lifestyle positively impacts risk factors.  Exercise Biomechanics Clinical staff led group instruction and group discussion with PowerPoint presentation and patient guidebook. To enhance the learning environment the use of posters, models and videos may be added. Patients will learn how the structural parts of their bodies function and how these functions impact their daily activities, movement, and exercise. Patients will learn how to promote a neutral spine, learn how to manage pain, and identify ways to improve their physical movement in order to promote healthy living. The purpose of this lesson is to expose patients to common physical limitations that impact physical activity. Participants will learn practical ways to adapt and manage aches and pains, and to minimize their effect on regular exercise. Patients will learn how to maintain good posture while sitting, walking, and lifting.  Balance Training and Fall Prevention  Clinical staff led group instruction and group discussion with PowerPoint presentation and patient guidebook. To enhance the learning environment the use of posters, models and videos  may be added. At the conclusion of this workshop, patients will understand the importance of their sensorimotor skills (vision, proprioception, and the vestibular system) in maintaining their ability to balance as  they age. Patients will apply a variety of balancing exercises that are appropriate for their current level of function. Patients will understand the common causes for poor balance, possible solutions to these problems, and ways to modify their physical environment in order to minimize their fall risk. The purpose of this lesson is to teach patients about the importance of maintaining balance as they age and ways to minimize their risk of falling.  WORKSHOPS   Nutrition:  Fueling a Ship broker led group instruction and group discussion with PowerPoint presentation and patient guidebook. To enhance the learning environment the use of posters, models and videos may be added. Patients will review the foundational principles of the Pritikin Eating Plan and understand what constitutes a serving size in each of the food groups. Patients will also learn Pritikin-friendly foods that are better choices when away from home and review make-ahead meal and snack options. Calorie density will be reviewed and applied to three nutrition priorities: weight maintenance, weight loss, and weight gain. The purpose of this lesson is to reinforce (in a group setting) the key concepts around what patients are recommended to eat and how to apply these guidelines when away from home by planning and selecting Pritikin-friendly options. Patients will understand how calorie density may be adjusted for different weight management goals.  Mindful Eating  Clinical staff led group instruction and group discussion with PowerPoint presentation and patient guidebook. To enhance the learning environment the use of posters, models and videos may be added. Patients will briefly review the concepts of the Pritikin  Eating Plan and the importance of low-calorie dense foods. The concept of mindful eating will be introduced as well as the importance of paying attention to internal hunger signals. Triggers for non-hunger eating and techniques for dealing with triggers will be explored. The purpose of this lesson is to provide patients with the opportunity to review the basic principles of the Pritikin Eating Plan, discuss the value of eating mindfully and how to measure internal cues of hunger and fullness using the Hunger Scale. Patients will also discuss reasons for non-hunger eating and learn strategies to use for controlling emotional eating.  Targeting Your Nutrition Priorities Clinical staff led group instruction and group discussion with PowerPoint presentation and patient guidebook. To enhance the learning environment the use of posters, models and videos may be added. Patients will learn how to determine their genetic susceptibility to disease by reviewing their family history. Patients will gain insight into the importance of diet as part of an overall healthy lifestyle in mitigating the impact of genetics and other environmental insults. The purpose of this lesson is to provide patients with the opportunity to assess their personal nutrition priorities by looking at their family history, their own health history and current risk factors. Patients will also be able to discuss ways of prioritizing and modifying the Pritikin Eating Plan for their highest risk areas  Menu  Clinical staff led group instruction and group discussion with PowerPoint presentation and patient guidebook. To enhance the learning environment the use of posters, models and videos may be added. Using menus brought in from [employer], or printed from [employer], patients will apply the Pritikin dining out guidelines that were presented in the [employer] video. Patients will also be able to practice these guidelines  in a variety of provided scenarios. The purpose of this lesson is to provide patients with the opportunity to practice hands-on learning of the Pritikin Dining Out guidelines  with actual menus and practice scenarios.  Label Reading Clinical staff led group instruction and group discussion with PowerPoint presentation and patient guidebook. To enhance the learning environment the use of posters, models and videos may be added. Patients will review and discuss the Pritikin label reading guidelines presented in Pritikin's Label Reading Educational series video. Using fool labels brought in from local grocery stores and markets, patients will apply the label reading guidelines and determine if the packaged food meet the Pritikin guidelines. The purpose of this lesson is to provide patients with the opportunity to review, discuss, and practice hands-on learning of the Pritikin Label Reading guidelines with actual packaged food labels. Cooking School  Pritikin's [employer] are designed to teach patients ways to prepare quick, simple, and affordable recipes at home. The importance of nutrition's role in chronic disease risk reduction is reflected in its emphasis in the overall Pritikin program. By learning how to prepare essential core Pritikin Eating Plan recipes, patients will increase control over what they eat; be able to customize the flavor of foods without the use of added salt, sugar, or fat; and improve the quality of the food they consume. By learning a set of core recipes which are easily assembled, quickly prepared, and affordable, patients are more likely to prepare more healthy foods at home. These workshops focus on convenient breakfasts, simple entres, side dishes, and desserts which can be prepared with minimal effort and are consistent with nutrition recommendations for cardiovascular risk reduction. Cooking [employer] are taught by a Armed forces logistics/support/administrative officer (RD) who has  been trained by the [employer]. The chef or RD has a clear understanding of the importance of minimizing - if not completely eliminating - added fat, sugar, and sodium in recipes. Throughout the series of Cooking School Workshop sessions, patients will learn about healthy ingredients and efficient methods of cooking to build confidence in their capability to prepare    Cooking School weekly topics:  Adding Flavor- Sodium-Free  Fast and Healthy Breakfasts  Powerhouse Plant-Based Proteins  Satisfying Salads and Dressings  Simple Sides and Sauces  International Cuisine-Spotlight on the [employer] Zones  Delicious Desserts  Savory Soups  [employer] - Meals in a Astronomer Appetizers and Snacks  Comforting Weekend Breakfasts  One-Pot Wonders   Fast Evening Meals  Landscape architect Your Pritikin Plate  WORKSHOPS   Healthy Mindset (Psychosocial):  Focused Goals, Sustainable Changes Clinical staff led group instruction and group discussion with PowerPoint presentation and patient guidebook. To enhance the learning environment the use of posters, models and videos may be added. Patients will be able to apply effective goal setting strategies to establish at least one personal goal, and then take consistent, meaningful action toward that goal. They will learn to identify common barriers to achieving personal goals and develop strategies to overcome them. Patients will also gain an understanding of how our mind-set can impact our ability to achieve goals and the importance of cultivating a positive and growth-oriented mind-set. The purpose of this lesson is to provide patients with a deeper understanding of how to set and achieve personal goals, as well as the tools and strategies needed to overcome common obstacles which may arise along the way.  From Head to Heart: The Power of a Healthy Outlook  Clinical staff led group instruction and group discussion with  PowerPoint presentation and patient guidebook. To enhance the learning environment the use of posters, models and videos may be added.  Patients will be able to recognize and describe the impact of emotions and mood on physical health. They will discover the importance of self-care and explore self-care practices which may work for them. Patients will also learn how to utilize the 4 C's to cultivate a healthier outlook and better manage stress and challenges. The purpose of this lesson is to demonstrate to patients how a healthy outlook is an essential part of maintaining good health, especially as they continue their cardiac rehab journey.  Healthy Sleep for a Healthy Heart Clinical staff led group instruction and group discussion with PowerPoint presentation and patient guidebook. To enhance the learning environment the use of posters, models and videos may be added. At the conclusion of this workshop, patients will be able to demonstrate knowledge of the importance of sleep to overall health, well-being, and quality of life. They will understand the symptoms of, and treatments for, common sleep disorders. Patients will also be able to identify daytime and nighttime behaviors which impact sleep, and they will be able to apply these tools to help manage sleep-related challenges. The purpose of this lesson is to provide patients with a general overview of sleep and outline the importance of quality sleep. Patients will learn about a few of the most common sleep disorders. Patients will also be introduced to the concept of "sleep hygiene," and discover ways to self-manage certain sleeping problems through simple daily behavior changes. Finally, the workshop will motivate patients by clarifying the links between quality sleep and their goals of heart-healthy living.   Recognizing and Reducing Stress Clinical staff led group instruction and group discussion with PowerPoint presentation and patient guidebook. To  enhance the learning environment the use of posters, models and videos may be added. At the conclusion of this workshop, patients will be able to understand the types of stress reactions, differentiate between acute and chronic stress, and recognize the impact that chronic stress has on their health. They will also be able to apply different coping mechanisms, such as reframing negative self-talk. Patients will have the opportunity to practice a variety of stress management techniques, such as deep abdominal breathing, progressive muscle relaxation, and/or guided imagery.  The purpose of this lesson is to educate patients on the role of stress in their lives and to provide healthy techniques for coping with it.  Learning Barriers/Preferences:  Learning Barriers/Preferences - 11/29/22 1227       Learning Barriers/Preferences   Learning Barriers Sight;Hearing   wears glasses/ hearing loss left ear   Learning Preferences Skilled Demonstration;Individual Instruction;Computer/Internet;Video;Pictoral             Education Topics:  Knowledge Questionnaire Score:  Knowledge Questionnaire Score - 11/29/22 1352       Knowledge Questionnaire Score   Pre Score 20/24             Core Components/Risk Factors/Patient Goals at Admission:  Personal Goals and Risk Factors at Admission - 11/29/22 1234       Core Components/Risk Factors/Patient Goals on Admission   Hypertension Yes    Intervention Provide education on lifestyle modifcations including regular physical activity/exercise, weight management, moderate sodium restriction and increased consumption of fresh fruit, vegetables, and low fat dairy, alcohol moderation, and smoking cessation.;Monitor prescription use compliance.    Expected Outcomes Short Term: Continued assessment and intervention until BP is < 140/24mm HG in hypertensive participants. < 130/16mm HG in hypertensive participants with diabetes, heart failure or chronic kidney  disease.;Long Term: Maintenance of blood pressure at goal levels.  Lipids Yes    Intervention Provide education and support for participant on nutrition & aerobic/resistive exercise along with prescribed medications to achieve LDL 70mg , HDL >40mg .    Expected Outcomes Short Term: Participant states understanding of desired cholesterol values and is compliant with medications prescribed. Participant is following exercise prescription and nutrition guidelines.;Long Term: Cholesterol controlled with medications as prescribed, with individualized exercise RX and with personalized nutrition plan. Value goals: LDL < 70mg , HDL > 40 mg.             Core Components/Risk Factors/Patient Goals Review:    Core Components/Risk Factors/Patient Goals at Discharge (Final Review):    ITP Comments:  ITP Comments     Row Name 11/29/22 1045           ITP Comments Armanda Magic, MD:  Medical Director. Introduction to the [facility] / Intensive Cardiac Rehab.  Initial orientation packet reviewed with the patient.                Comments: Participant attended orientation for the cardiac rehabilitation program on  11/29/2022  to perform initial intake and exercise walk test. Patient introduced to the Pritikin Program education and orientation packet was reviewed. Completed 6-minute walk test, measurements, initial ITP, and exercise prescription. Vital signs stable. Telemetry-normal sinus rhythm, rare PVC, asymptomatic.   Service time was from 1015 to 1242.

## 2022-11-29 NOTE — Progress Notes (Signed)
Cardiac Rehab Medication Review   Does the patient  feel that his/her medications are working for him/her?  yes  Has the patient been experiencing any side effects to the medications prescribed?  no  Does the patient measure his/her own blood pressure or blood glucose at home?  yes   Does the patient have any problems obtaining medications due to transportation or finances?   no  Understanding of regimen: excellent Understanding of indications: excellent Potential of compliance: excellent    Comments: Pt checks blood pressure at home 3x/day. Has good understanding of his medications.    Lorin Picket 11/29/2022 10:35 AM

## 2022-11-30 ENCOUNTER — Ambulatory Visit (HOSPITAL_COMMUNITY): Payer: Medicare Other | Attending: Internal Medicine

## 2022-11-30 DIAGNOSIS — Z952 Presence of prosthetic heart valve: Secondary | ICD-10-CM | POA: Diagnosis present

## 2022-11-30 LAB — ECHOCARDIOGRAM COMPLETE
AV Mean grad: 4.4 mmHg
AV Peak grad: 8.4 mmHg
Ao pk vel: 1.45 m/s
Area-P 1/2: 4.23 cm2
S' Lateral: 2.7 cm

## 2022-12-03 ENCOUNTER — Encounter (HOSPITAL_COMMUNITY): Payer: Medicare Other

## 2022-12-05 ENCOUNTER — Encounter (HOSPITAL_COMMUNITY)
Admission: RE | Admit: 2022-12-05 | Discharge: 2022-12-05 | Disposition: A | Discharge status: Home / Self Care | Payer: Medicare Other | Source: Ambulatory Visit | Attending: Cardiology | Admitting: Cardiology

## 2022-12-05 ENCOUNTER — Telehealth: Payer: Self-pay | Admitting: Cardiology

## 2022-12-05 DIAGNOSIS — Z48812 Encounter for surgical aftercare following surgery on the circulatory system: Secondary | ICD-10-CM | POA: Insufficient documentation

## 2022-12-05 DIAGNOSIS — Z952 Presence of prosthetic heart valve: Secondary | ICD-10-CM

## 2022-12-05 DIAGNOSIS — Z951 Presence of aortocoronary bypass graft: Secondary | ICD-10-CM | POA: Insufficient documentation

## 2022-12-05 NOTE — Telephone Encounter (Signed)
Called and spoke with patient, discussed echo results.  Per Dr. 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 The left atrium is moderately enlarged, trivial leakiness of mitral valve. Stable AVR. 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 Unclear why EF is down - please have patient come back for a limited echo using definity contrast, LV strain and 3D EF   Patient wanted to note that he does not feel this echo was performed as thoroughly as it had been in the past.  Also, patient would like to know if he can discontinue the carvedilol, now or at some point in the near future. He states his BP ranges 105-102's/60's-80's. He reports feeling tired and dizzy with BP meds in the past and would prefer to not take this if it's not absolutely necessary. He currently denies any fatigue or dizziness.  Limited echo ordered, appt scheduled for 12/31/22.  Patient concerned about not being able to get in to see Dr. Mayford Knife until September. Informed patient most of our providers are booked out until the end of the year now. Patient verbalized understanding and will keep appointments as scheduled.

## 2022-12-05 NOTE — Telephone Encounter (Signed)
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   The left atrium is moderately enlarged,  trivial leakiness of mitral valve. Stable AVR. 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  Unclear why EF is down - please have patient come back for a limited echo using definity contrast, LV strain and 3D EF

## 2022-12-05 NOTE — Progress Notes (Signed)
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 Discontinue once prescription runs out (Patient not taking: Reported on 11/29/2022), Disp: , Rfl:    aspirin EC 81 MG tablet, Take 1 tablet (81 mg total) by mouth daily. Swallow whole., Disp: 150 tablet, Rfl: 2   buPROPion (WELLBUTRIN XL) 150 MG 24 hr tablet, Take 300 mg by mouth in the morning., Disp: , Rfl:    carvedilol (COREG) 3.125 MG tablet, Take 1 tablet (3.125 mg total) by mouth 2 (two) times daily with a meal., Disp: 60 tablet, Rfl: 1   cetirizine (ZYRTEC) 10 MG tablet, Take 10 mg by mouth at bedtime., Disp: , Rfl:    colchicine 0.6 MG tablet, Take 0.6 mg by mouth every 6 (six) hours as needed (gout flares). , Disp: , Rfl:    famotidine (PEPCID) 20 MG tablet, Take 20 mg by mouth at bedtime., Disp: , Rfl:    Fe Fum-Vit C-Vit B12-FA (TRIGELS-F FORTE) CAPS capsule, Take 1 capsule by mouth daily after breakfast. May substitute with similar generic medication (Patient not taking: Reported on 11/29/2022), Disp: 30 capsule, Rfl: 0   fluticasone (FLONASE) 50 MCG/ACT nasal spray, Place  1-2 sprays into both nostrils at bedtime as needed for allergies., Disp: , Rfl:    gabapentin (NEURONTIN) 300 MG capsule, Take 600 mg by mouth 3 (three) times daily., Disp: , Rfl:    Glucosamine-Chondroitin (COSAMIN DS PO), Take 1 tablet by mouth 2 (two) times daily. Triple Strength, Disp: , Rfl:    Multiple Vitamin (MULTIVITAMIN WITH MINERALS) TABS tablet, Take 1 tablet by mouth daily. Complete Multivitamin, Disp: , Rfl:    Probiotic Product (ALIGN) 4 MG CAPS, Take 4 mg by mouth daily as needed (gut health)., Disp: , Rfl:    rosuvastatin (CRESTOR) 10 MG tablet, Take 10 mg by mouth in the morning., Disp: , Rfl:    SALINE NASAL MIST NA, Place 1 spray into the nose 3 (three) times daily as needed (congestion)., Disp: , Rfl:    tamsulosin (FLOMAX) 0.4 MG CAPS capsule, Take 0.8 mg by mouth every evening., Disp: , Rfl:    TURMERIC PO, Take 1,000 mg by mouth in the morning and at bedtime., Disp: , Rfl:   Past Medical History: Past Medical History:  Diagnosis Date   Aortic regurgitation 01/07/2019   severe and eccentric by TEE [id number] with RF 69%.   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    Tobacco Use: Social History   Tobacco Use  Smoking Status Former   Types: Cigarettes   Quit date: 07/12/1980   Years since quitting: 42.4  Smokeless Tobacco Never    Labs: Review Flowsheet  More data exists      Latest Ref Rng & Units 09/28/2022 10/16/2022 10/18/2022 10/19/2022 11/02/2022  Labs for ITP Cardiac and Pulmonary Rehab  Cholestrol 100 - 199 mg/dL - - - - 90   LDL (calc) 0 - 99  mg/dL - - - - 32   HDL-C >40 mg/dL - - - - 37   Trlycerides 0 - 149 mg/dL - - - - 981   Hemoglobin A1c 4.8 - 5.6 % - 5.5  - - -  PH, Arterial 7.35 - 7.45 7.326  7.4  7.411  7.396  7.335  7.371  7.365  7.390  -  PCO2 arterial 32 - 48 mmHg 46.3  35  32.8  35.9  41.7  42.2  40.2  38.7  -  Bicarbonate 20.0 - 28.0 mmol/L 23.1  24.4  24.2  21.7  21.2  22.0  22.2  24.4  22.9  23.2  -  TCO2 22 - 32 mmol/L 24  26  26   - 22  22  23  24  24  23  26  26  27  24  24   -  Acid-base deficit 0.0 - 2.0 mmol/L 3.0  2.0  2.0  2.5  3.0  3.0  3.0  1.0  2.0  1.0  -  O2 Saturation % 67  69  93  98  97  99  100  100  99  99  -    Capillary Blood Glucose: Lab Results  Component Value Date   GLUCAP 106 (H) 10/23/2022   GLUCAP 124 (H) 10/22/2022   GLUCAP 112 (H) 10/21/2022   GLUCAP 114 (H) 10/21/2022   GLUCAP 133 (H) 10/21/2022     Exercise Target Goals: Exercise Program Goal: Individual exercise prescription set using results from initial 6 min walk test and THRR while considering  patient's activity barriers and safety.   Exercise Prescription Goal: Initial exercise prescription builds to 30-45 minutes a day of aerobic activity, 2-3 days per week.  Home exercise guidelines will be given to patient during program as part of exercise prescription that the participant will acknowledge.  Activity Barriers & Risk Stratification:  Activity Barriers & Cardiac Risk Stratification - 11/29/22 1224       Activity Barriers & Cardiac Risk Stratification   Activity Barriers Arthritis;Back Problems;Right Knee Replacement;Joint Problems;Deconditioning;Muscular Weakness;Balance Concerns;Other (comment)    Comments Sternal Precautions    Cardiac Risk Stratification High             6 Minute Walk:  6 Minute Walk     Row Name 11/29/22 1142         6 Minute Walk   Phase Initial  Used Go-cart     Distance 1233 feet     Walk Time 6 minutes     # of Rest Breaks 0     MPH 2.34     METS 2.45     RPE 10  Perceived Dyspnea  0     VO2 Peak 8.6     Symptoms No     Resting HR 75 bpm     Resting BP 116/80     Resting Oxygen Saturation  98 %     Exercise Oxygen Saturation  during 6 min walk 98 %     Max Ex. HR 90 bpm     Max Ex. BP 110/70     2 Minute Post BP 120/80              Oxygen Initial Assessment:   Oxygen Re-Evaluation:   Oxygen Discharge (Final Oxygen Re-Evaluation):   Initial Exercise Prescription:  Initial Exercise Prescription - 11/29/22 1200       Date of Initial Exercise RX and Referring Provider   Date 11/29/22    Referring Provider Armanda Magic, MD    Expected Discharge Date 02/08/23      NuStep   Level 1    SPM 80    Minutes 15    METs 2.45      Arm Ergometer   Level 1    Watts 25    RPM 60    Minutes 15    METs 2.45      Prescription Details   Frequency (times per week) 3    Duration Progress to 30 minutes of continuous aerobic without signs/symptoms of physical distress      Intensity   THRR 40-80% of Max Heartrate 58-116    Ratings of Perceived Exertion 11-13    Perceived Dyspnea 0-4      Progression   Progression Continue progressive overload as per policy without signs/symptoms or physical distress.      Resistance Training   Training Prescription Yes    Weight 3 lbs    Reps 10-15             Perform Capillary Blood Glucose checks as needed.  Exercise Prescription Changes:   Exercise Prescription Changes     Row Name 12/05/22 1400             Response to Exercise   Blood Pressure (Admit) 114/80       Blood Pressure (Exercise) 120/80       Blood Pressure (Exit) 102/68       Heart Rate (Admit) 69 bpm       Heart Rate (Exercise) 96 bpm       Heart Rate (Exit) 78 bpm       Rating of Perceived Exertion (Exercise) 11.5       Symptoms None       Comments Pt's first day in the CRP2 program       Duration Continue with 30 min of aerobic exercise without signs/symptoms of physical distress.       Intensity THRR  unchanged         Progression   Progression Continue to progress workloads to maintain intensity without signs/symptoms of physical distress.       Average METs 1.85         Resistance Training   Training Prescription No       Weight No weights on Wednesdays         Interval Training   Interval Training No         NuStep   Level 1       Minutes 15       METs 2.2         Arm Ergometer   Level  1       Watts 9       RPM 39       Minutes 15       METs 1.5                Exercise Comments:   Exercise Comments     Row Name 12/05/22 1422           Exercise Comments Pt's first day in the CRP2 program. Pt exercised without complaints.                Exercise Goals and Review:   Exercise Goals     Row Name 11/29/22 1214             Exercise Goals   Increase Physical Activity Yes       Intervention Provide advice, education, support and counseling about physical activity/exercise needs.;Develop an individualized exercise prescription for aerobic and resistive training based on initial evaluation findings, risk stratification, comorbidities and participant's personal goals.       Expected Outcomes Short Term: Attend rehab on a regular basis to increase amount of physical activity.;Long Term: Add in home exercise to make exercise part of routine and to increase amount of physical activity.;Long Term: Exercising regularly at least 3-5 days a week.       Increase Strength and Stamina Yes       Intervention Provide advice, education, support and counseling about physical activity/exercise needs.;Develop an individualized exercise prescription for aerobic and resistive training based on initial evaluation findings, risk stratification, comorbidities and participant's personal goals.       Expected Outcomes Short Term: Increase workloads from initial exercise prescription for resistance, speed, and METs.;Short Term: Perform resistance training exercises routinely during  rehab and add in resistance training at home;Long Term: Improve cardiorespiratory fitness, muscular endurance and strength as measured by increased METs and functional capacity ( )       Able to understand and use rate of perceived exertion (RPE) scale Yes       Intervention Provide education and explanation on how to use RPE scale       Expected Outcomes Short Term: Able to use RPE daily in rehab to express subjective intensity level;Long Term:  Able to use RPE to guide intensity level when exercising independently       Knowledge and understanding of Target Heart Rate Range (THRR) Yes       Intervention Provide education and explanation of THRR including how the numbers were predicted and where they are located for reference       Expected Outcomes Short Term: Able to state/look up THRR;Short Term: Able to use daily as guideline for intensity in rehab;Long Term: Able to use THRR to govern intensity when exercising independently       Understanding of Exercise Prescription Yes       Intervention Provide education, explanation, and written materials on patient's individual exercise prescription       Expected Outcomes Short Term: Able to explain program exercise prescription;Long Term: Able to explain home exercise prescription to exercise independently                Exercise Goals Re-Evaluation :  Exercise Goals Re-Evaluation     Row Name 12/05/22 1420             Exercise Goal Re-Evaluation   Exercise Goals Review Increase Physical Activity;Understanding of Exercise Prescription;Increase Strength and Stamina;Knowledge and understanding of Target Heart Rate Range (THRR);Able to understand  and use rate of perceived exertion (RPE) scale       Comments Pt's first day in the CRP2 program. Pt understands the exercise Rx, REP scale and THRR.       Expected Outcomes Will continue to monitor patient and progress exercise workloads as tolerated.                Discharge Exercise  Prescription (Final Exercise Prescription Changes):  Exercise Prescription Changes - 12/05/22 1400       Response to Exercise   Blood Pressure (Admit) 114/80    Blood Pressure (Exercise) 120/80    Blood Pressure (Exit) 102/68    Heart Rate (Admit) 69 bpm    Heart Rate (Exercise) 96 bpm    Heart Rate (Exit) 78 bpm    Rating of Perceived Exertion (Exercise) 11.5    Symptoms None    Comments Pt's first day in the CRP2 program    Duration Continue with 30 min of aerobic exercise without signs/symptoms of physical distress.    Intensity THRR unchanged      Progression   Progression Continue to progress workloads to maintain intensity without signs/symptoms of physical distress.    Average METs 1.85      Resistance Training   Training Prescription No    Weight No weights on Wednesdays      Interval Training   Interval Training No      NuStep   Level 1    Minutes 15    METs 2.2      Arm Ergometer   Level 1    Watts 9    RPM 39    Minutes 15    METs 1.5             Nutrition:  Target Goals: Understanding of nutrition guidelines, daily intake of sodium 1500mg , cholesterol 200mg , calories 30% from fat and 7% or less from saturated fats, daily to have 5 or more servings of fruits and vegetables.  Biometrics:  Pre Biometrics - 11/29/22 1055       Pre Biometrics   Waist Circumference 40.5 inches    Hip Circumference 40 inches    Waist to Hip Ratio 1.01 %    Triceps Skinfold 12 mm    Grip Strength 42 kg    Flexibility 0 in   could not reach   Single Leg Stand 2 seconds             Post Biometrics - 11/29/22 1055        Post  Biometrics   % Body Fat 26.2 %             Nutrition Therapy Plan and Nutrition Goals:  Nutrition Therapy & Goals - 12/05/22 1329       Nutrition Therapy   Diet Heart Healthy Diet    Drug/Food Interactions Statins/Certain Fruits      Personal Nutrition Goals   Nutrition Goal Patient to identify strategies for reducing  cardiovascular risk by attending the Pritikin education and nutrition series weekly.    Personal Goal #2 Patient to improve diet quality by using the plate method as a guide for meal planning to include lean protein/plant protein, fruits, vegetables, whole grains, nonfat dairy as part of a well-balanced diet.    Personal Goal #3 Patient to reduce sodium to 1500mg  per day    Comments Tammy Sours reports that he and his wife "eat the wrong things" but are receptive to making dietary/lifestyle changes. Patient will benefit from participation  in intensive cardiac rehab for nutrition, exercise, and lifestyle modification.      Intervention Plan   Intervention Prescribe, educate and counsel regarding individualized specific dietary modifications aiming towards targeted core components such as weight, hypertension, lipid management, diabetes, heart failure and other comorbidities.;Nutrition handout(s) given to patient.    Expected Outcomes Short Term Goal: Understand basic principles of dietary content, such as calories, fat, sodium, cholesterol and nutrients.;Long Term Goal: Adherence to prescribed nutrition plan.             Nutrition Assessments:  MEDIFICTS Score Key: ?70 Need to make dietary changes  40-70 Heart Healthy Diet ? 40 Therapeutic Level Cholesterol Diet    Picture Your Plate Scores: <40 Unhealthy dietary pattern with much room for improvement. 41-50 Dietary pattern unlikely to meet recommendations for good health and room for improvement. 51-60 More healthful dietary pattern, with some room for improvement.  >60 Healthy dietary pattern, although there may be some specific behaviors that could be improved.    Nutrition Goals Re-Evaluation:  Nutrition Goals Re-Evaluation     Row Name 12/05/22 1329             Goals   Current Weight 190 lb 7.6 oz (86.4 kg)       Comment HDL 37, lipoprotein A WNL, hemoglobin 9.7, hematocrit 29.8       Expected Outcome Tammy Sours reports that he and  his wife "eat the wrong things" but are receptive to making dietary/lifestyle changes. Patient will benefit from participation in intensive cardiac rehab for nutrition, exercise, and lifestyle modification.                Nutrition Goals Re-Evaluation:  Nutrition Goals Re-Evaluation     Row Name 12/05/22 1329             Goals   Current Weight 190 lb 7.6 oz (86.4 kg)       Comment HDL 37, lipoprotein A WNL, hemoglobin 9.7, hematocrit 29.8       Expected Outcome Tammy Sours reports that he and his wife "eat the wrong things" but are receptive to making dietary/lifestyle changes. Patient will benefit from participation in intensive cardiac rehab for nutrition, exercise, and lifestyle modification.                Nutrition Goals Discharge (Final Nutrition Goals Re-Evaluation):  Nutrition Goals Re-Evaluation - 12/05/22 1329       Goals   Current Weight 190 lb 7.6 oz (86.4 kg)    Comment HDL 37, lipoprotein A WNL, hemoglobin 9.7, hematocrit 29.8    Expected Outcome Tammy Sours reports that he and his wife "eat the wrong things" but are receptive to making dietary/lifestyle changes. Patient will benefit from participation in intensive cardiac rehab for nutrition, exercise, and lifestyle modification.             Psychosocial: Target Goals: Acknowledge presence or absence of significant depression and/or stress, maximize coping skills, provide positive support system. Participant is able to verbalize types and ability to use techniques and skills needed for reducing stress and depression.  Initial Review & Psychosocial Screening:  Initial Psych Review & Screening - 11/29/22 1225       Initial Review   Current issues with Current Sleep Concerns;History of Depression   Voices he is not currently depressed. Pt feels his Wellbutrin is working well for him.     Family Dynamics   Good Support System? Yes   Has spouse for support     Barriers  Psychosocial barriers to participate in  program There are no identifiable barriers or psychosocial needs.      Screening Interventions   Interventions Encouraged to exercise             Quality of Life Scores:  Quality of Life - 11/29/22 1349       Quality of Life   Select Quality of Life      Quality of Life Scores   Health/Function Pre 21 %    Socioeconomic Pre 22.14 %    Psych/Spiritual Pre 24 %    Family Pre 20.5 %    GLOBAL Pre 21.78 %            Scores of 19 and below usually indicate a poorer quality of life in these areas.  A difference of  2-3 points is a clinically meaningful difference.  A difference of 2-3 points in the total score of the Quality of Life Index has been associated with significant improvement in overall quality of life, self-image, physical symptoms, and general health in studies assessing change in quality of life.  PHQ-9: Review Flowsheet       11/29/2022  Depression screen PHQ 2/9  Decreased Interest 1  Down, Depressed, Hopeless 0  PHQ - 2 Score 1  Altered sleeping 1  Tired, decreased energy 1  Change in appetite 0  Feeling bad or failure about yourself  0  Trouble concentrating 2  Moving slowly or fidgety/restless 2  Suicidal thoughts 0  PHQ-9 Score 7  Difficult doing work/chores Somewhat difficult   Interpretation of Total Score  Total Score Depression Severity:  1-4 = Minimal depression, 5-9 = Mild depression, 10-14 = Moderate depression, 15-19 = Moderately severe depression, 20-27 = Severe depression   Psychosocial Evaluation and Intervention:   Psychosocial Re-Evaluation:   Psychosocial Discharge (Final Psychosocial Re-Evaluation):   Vocational Rehabilitation: Provide vocational rehab assistance to qualifying candidates.   Vocational Rehab Evaluation & Intervention:  Vocational Rehab - 11/29/22 1234       Initial Vocational Rehab Evaluation & Intervention   Assessment shows need for Vocational Rehabilitation No   Pt is retired             Education: Education Goals: Education classes will be provided on a weekly basis, covering required topics. Participant will state understanding/return demonstration of topics presented.    Education     Row Name 12/05/22 1600     Education   Cardiac Education Topics Pritikin   Environmental education officer - Meals in a Snap   Instruction Review Code 1- Verbalizes Understanding   Class Start Time 1400   Class Stop Time 1445   Class Time Calculation (min) 45 min            Core Videos: Exercise    Move It!  Clinical staff conducted group or individual video education with verbal and written material and guidebook.  Patient learns the recommended Pritikin exercise program. Exercise with the goal of living a long, healthy life. Some of the health benefits of exercise include controlled diabetes, healthier blood pressure levels, improved cholesterol levels, improved heart and lung capacity, improved sleep, and better body composition. Everyone should speak with their doctor before starting or changing an exercise routine.  Biomechanical Limitations Clinical staff conducted group or individual video education with verbal and written material and guidebook.  Patient learns how biomechanical limitations can impact exercise and  how we can mitigate and possibly overcome limitations to have an impactful and balanced exercise routine.  Body Composition Clinical staff conducted group or individual video education with verbal and written material and guidebook.  Patient learns that body composition (ratio of muscle mass to fat mass) is a key component to assessing overall fitness, rather than body weight alone. Increased fat mass, especially visceral belly fat, can put [place] at increased risk for metabolic syndrome, type 2 diabetes, heart disease, and even death. It is recommended to combine diet and exercise (cardiovascular  and resistance training) to improve your body composition. Seek guidance from your physician and exercise physiologist before implementing an exercise routine.  Exercise Action Plan Clinical staff conducted group or individual video education with verbal and written material and guidebook.  Patient learns the recommended strategies to achieve and enjoy long-term exercise adherence, including variety, self-motivation, self-efficacy, and positive decision making. Benefits of exercise include fitness, good health, weight management, more energy, better sleep, less stress, and overall well-being.  Medical   Heart Disease Risk Reduction Clinical staff conducted group or individual video education with verbal and written material and guidebook.  Patient learns our heart is our most vital organ as it circulates oxygen, nutrients, white blood cells, and hormones throughout the entire body, and carries waste away. Data supports a plant-based eating plan like the Pritikin Program for its effectiveness in slowing progression of and reversing heart disease. The video provides a number of recommendations to address heart disease.   Metabolic Syndrome and Belly Fat  Clinical staff conducted group or individual video education with verbal and written material and guidebook.  Patient learns what metabolic syndrome is, how it leads to heart disease, and how one can reverse it and keep it from coming back. You have metabolic syndrome if you have 3 of the following 5 criteria: abdominal obesity, high blood pressure, high triglycerides, low HDL cholesterol, and high blood sugar.  Hypertension and Heart Disease Clinical staff conducted group or individual video education with verbal and written material and guidebook.  Patient learns that high blood pressure, or hypertension, is very common in the [place]. Hypertension is largely due to excessive salt intake, but other important risk factors include being  overweight, physical inactivity, drinking too much alcohol, smoking, and not eating enough potassium from fruits and vegetables. High blood pressure is a leading risk factor for heart attack, stroke, congestive heart failure, dementia, kidney failure, and premature death. Long-term effects of excessive salt intake include stiffening of the arteries and thickening of heart muscle and organ damage. Recommendations include ways to reduce hypertension and the risk of heart disease.  Diseases of Our Time - Focusing on Diabetes Clinical staff conducted group or individual video education with verbal and written material and guidebook.  Patient learns why the best way to stop diseases of our time is prevention, through food and other lifestyle changes. Medicine (such as prescription pills and surgeries) is often only a Band-Aid on the problem, not a long-term solution. Most common diseases of our time include obesity, type 2 diabetes, hypertension, heart disease, and cancer. The Pritikin Program is recommended and has been proven to help reduce, reverse, and/or prevent the damaging effects of metabolic syndrome.  Nutrition   Overview of the Pritikin Eating Plan  Clinical staff conducted group or individual video education with verbal and written material and guidebook.  Patient learns about the Pritikin Eating Plan for disease risk reduction. The Pritikin Eating Plan emphasizes a wide variety of  unrefined, minimally-processed carbohydrates, like fruits, vegetables, whole grains, and legumes. Go, Caution, and Stop food choices are explained. Plant-based and lean animal proteins are emphasized. Rationale provided for low sodium intake for blood pressure control, low added sugars for blood sugar stabilization, and low added fats and oils for coronary artery disease risk reduction and weight management.  Calorie Density  Clinical staff conducted group or individual video education with verbal and written material  and guidebook.  Patient learns about calorie density and how it impacts the Pritikin Eating Plan. Knowing the characteristics of the food you choose will help you decide whether those foods will lead to weight gain or weight loss, and whether you want to consume more or less of them. Weight loss is usually a side effect of the Pritikin Eating Plan because of its focus on low calorie-dense foods.  Label Reading  Clinical staff conducted group or individual video education with verbal and written material and guidebook.  Patient learns about the Pritikin recommended label reading guidelines and corresponding recommendations regarding calorie density, added sugars, sodium content, and whole grains.  Dining Out - Part 1  Clinical staff conducted group or individual video education with verbal and written material and guidebook.  Patient learns that restaurant meals can be sabotaging because they can be so high in calories, fat, sodium, and/or sugar. Patient learns recommended strategies on how to positively address this and avoid unhealthy pitfalls.  Facts on Fats  Clinical staff conducted group or individual video education with verbal and written material and guidebook.  Patient learns that lifestyle modifications can be just as effective, if not more so, as many medications for lowering your risk of heart disease. A Pritikin lifestyle can help to reduce your risk of inflammation and atherosclerosis (cholesterol build-up, or plaque, in the artery walls). Lifestyle interventions such as dietary choices and physical activity address the cause of atherosclerosis. A review of the types of fats and their impact on blood cholesterol levels, along with dietary recommendations to reduce fat intake is also included.  Nutrition Action Plan  Clinical staff conducted group or individual video education with verbal and written material and guidebook.  Patient learns how to incorporate Pritikin recommendations into  their lifestyle. Recommendations include planning and keeping personal health goals in mind as an important part of their success.  Healthy Mind-Set    Healthy Minds, Bodies, Hearts  Clinical staff conducted group or individual video education with verbal and written material and guidebook.  Patient learns how to identify when they are stressed. Video will discuss the impact of that stress, as well as the many benefits of stress management. Patient will also be introduced to stress management techniques. The way we think, act, and feel has an impact on our hearts.  How Our Thoughts Can Heal Our Hearts  Clinical staff conducted group or individual video education with verbal and written material and guidebook.  Patient learns that negative thoughts can cause depression and anxiety. This can result in negative lifestyle behavior and serious health problems. Cognitive behavioral therapy is an effective method to help control our thoughts in order to change and improve our emotional outlook.  Additional Videos:  Exercise    Improving Performance  Clinical staff conducted group or individual video education with verbal and written material and guidebook.  Patient learns to use a non-linear approach by alternating intensity levels and lengths of time spent exercising to help burn more calories and lose more body fat. Cardiovascular exercise helps improve heart health,  metabolism, hormonal balance, blood sugar control, and recovery from fatigue. Resistance training improves strength, endurance, balance, coordination, reaction time, metabolism, and muscle mass. Flexibility exercise improves circulation, posture, and balance. Seek guidance from your physician and exercise physiologist before implementing an exercise routine and learn your capabilities and proper form for all exercise.  Introduction to Yoga  Clinical staff conducted group or individual video education with verbal and written material and  guidebook.  Patient learns about yoga, a discipline of the coming together of mind, breath, and body. The benefits of yoga include improved flexibility, improved range of motion, better posture and core strength, increased lung function, weight loss, and positive self-image. Yoga's heart health benefits include lowered blood pressure, healthier heart rate, decreased cholesterol and triglyceride levels, improved immune function, and reduced stress. Seek guidance from your physician and exercise physiologist before implementing an exercise routine and learn your capabilities and proper form for all exercise.  Medical   Aging: Enhancing Your Quality of Life  Clinical staff conducted group or individual video education with verbal and written material and guidebook.  Patient learns key strategies and recommendations to stay in good physical health and enhance quality of life, such as prevention strategies, having an advocate, securing a Health Care Proxy and Power of Attorney, and keeping a list of medications and system for tracking them. It also discusses how to avoid risk for bone loss.  Biology of Weight Control  Clinical staff conducted group or individual video education with verbal and written material and guidebook.  Patient learns that weight gain occurs because we consume more calories than we burn (eating more, moving less). Even if your body weight is normal, you may have higher ratios of fat compared to muscle mass. Too much body fat puts you at increased risk for cardiovascular disease, heart attack, stroke, type 2 diabetes, and obesity-related cancers. In addition to exercise, following the Pritikin Eating Plan can help reduce your risk.  Decoding Lab Results  Clinical staff conducted group or individual video education with verbal and written material and guidebook.  Patient learns that lab test reflects one measurement whose values change over time and are influenced by many factors,  including medication, stress, sleep, exercise, food, hydration, pre-existing medical conditions, and more. It is recommended to use the knowledge from this video to become more involved with your lab results and evaluate your numbers to speak with your doctor.   Diseases of Our Time - Overview  Clinical staff conducted group or individual video education with verbal and written material and guidebook.  Patient learns that according to the CDC, 50% to 70% of chronic diseases (such as obesity, type 2 diabetes, elevated lipids, hypertension, and heart disease) are avoidable through lifestyle improvements including healthier food choices, listening to satiety cues, and increased physical activity.  Sleep Disorders Clinical staff conducted group or individual video education with verbal and written material and guidebook.  Patient learns how good quality and duration of sleep are important to overall health and well-being. Patient also learns about sleep disorders and how they impact health along with recommendations to address them, including discussing with a physician.  Nutrition  Dining Out - Part 2 Clinical staff conducted group or individual video education with verbal and written material and guidebook.  Patient learns how to plan ahead and communicate in order to maximize their dining experience in a healthy and nutritious manner. Included are recommended food choices based on the type of restaurant the patient is visiting.   Fueling  a Healthy Body  Clinical staff conducted group or individual video education with verbal and written material and guidebook.  There is a strong connection between our food choices and our health. Diseases like obesity and type 2 diabetes are very prevalent and are in large-part due to lifestyle choices. The Pritikin Eating Plan provides plenty of food and hunger-curbing satisfaction. It is easy to follow, affordable, and helps reduce health risks.  Menu Workshop   Clinical staff conducted group or individual video education with verbal and written material and guidebook.  Patient learns that restaurant meals can sabotage health goals because they are often packed with calories, fat, sodium, and sugar. Recommendations include strategies to plan ahead and to communicate with the manager, chef, or server to help order a healthier meal.  Planning Your Eating Strategy  Clinical staff conducted group or individual video education with verbal and written material and guidebook.  Patient learns about the Pritikin Eating Plan and its benefit of reducing the risk of disease. The Pritikin Eating Plan does not focus on calories. Instead, it emphasizes high-quality, nutrient-rich foods. By knowing the characteristics of the foods, we choose, we can determine their calorie density and make informed decisions.  Targeting Your Nutrition Priorities  Clinical staff conducted group or individual video education with verbal and written material and guidebook.  Patient learns that lifestyle habits have a tremendous impact on disease risk and progression. This video provides eating and physical activity recommendations based on your personal health goals, such as reducing LDL cholesterol, losing weight, preventing or controlling type 2 diabetes, and reducing high blood pressure.  Vitamins and Minerals  Clinical staff conducted group or individual video education with verbal and written material and guidebook.  Patient learns different ways to obtain key vitamins and minerals, including through a recommended healthy diet. It is important to discuss all supplements you take with your doctor.   Healthy Mind-Set    Smoking Cessation  Clinical staff conducted group or individual video education with verbal and written material and guidebook.  Patient learns that cigarette smoking and tobacco addiction pose a serious health risk which affects millions of people. Stopping smoking  will significantly reduce the risk of heart disease, lung disease, and many forms of cancer. Recommended strategies for quitting are covered, including working with your doctor to develop a successful plan.  Culinary   Becoming a Set designer conducted group or individual video education with verbal and written material and guidebook.  Patient learns that cooking at home can be healthy, cost-effective, quick, and puts them in control. Keys to cooking healthy recipes will include looking at your recipe, assessing your equipment needs, planning ahead, making it simple, choosing cost-effective seasonal ingredients, and limiting the use of added fats, salts, and sugars.  Cooking - Breakfast and Snacks  Clinical staff conducted group or individual video education with verbal and written material and guidebook.  Patient learns how important breakfast is to satiety and nutrition through the entire day. Recommendations include key foods to eat during breakfast to help stabilize blood sugar levels and to prevent overeating at meals later in the day. Planning ahead is also a key component.  Cooking - Educational psychologist conducted group or individual video education with verbal and written material and guidebook.  Patient learns eating strategies to improve overall health, including an approach to cook more at home. Recommendations include thinking of animal protein as a side on your plate rather than center stage and  focusing instead on lower calorie dense options like vegetables, fruits, whole grains, and plant-based proteins, such as beans. Making sauces in large quantities to freeze for later and leaving the skin on your vegetables are also recommended to maximize your experience.  Cooking - Healthy Salads and Dressing Clinical staff conducted group or individual video education with verbal and written material and guidebook.  Patient learns that vegetables, fruits, whole  grains, and legumes are the foundations of the Pritikin Eating Plan. Recommendations include how to incorporate each of these in flavorful and healthy salads, and how to create homemade salad dressings. Proper handling of ingredients is also covered. Cooking - Soups and [employer] - Soups and Desserts Clinical staff conducted group or individual video education with verbal and written material and guidebook.  Patient learns that Pritikin soups and desserts make for easy, nutritious, and delicious snacks and meal components that are low in sodium, fat, sugar, and calorie density, while high in vitamins, minerals, and filling fiber. Recommendations include simple and healthy ideas for soups and desserts.   Overview     The Pritikin Solution Program Overview Clinical staff conducted group or individual video education with verbal and written material and guidebook.  Patient learns that the results of the Pritikin Program have been documented in more than 100 articles published in peer-reviewed journals, and the benefits include reducing risk factors for (and, in some cases, even reversing) high cholesterol, high blood pressure, type 2 diabetes, obesity, and more! An overview of the three key pillars of the Pritikin Program will be covered: eating well, doing regular exercise, and having a healthy mind-set.  WORKSHOPS  Exercise: Exercise Basics: Building Your Action Plan Clinical staff led group instruction and group discussion with PowerPoint presentation and patient guidebook. To enhance the learning environment the use of posters, models and videos may be added. At the conclusion of this workshop, patients will comprehend the difference between physical activity and exercise, as well as the benefits of incorporating both, into their routine. Patients will understand the FITT (Frequency, Intensity, Time, and Type) principle and how to use it to build an exercise action plan. In addition,  safety concerns and other considerations for exercise and cardiac rehab will be addressed by the presenter. The purpose of this lesson is to promote a comprehensive and effective weekly exercise routine in order to improve patients' overall level of fitness.   Managing Heart Disease: Your Path to a Healthier Heart Clinical staff led group instruction and group discussion with PowerPoint presentation and patient guidebook. To enhance the learning environment the use of posters, models and videos may be added.At the conclusion of this workshop, patients will understand the anatomy and physiology of the heart. Additionally, they will understand how Pritikin's three pillars impact the risk factors, the progression, and the management of heart disease.  The purpose of this lesson is to provide a high-level overview of the heart, heart disease, and how the Pritikin lifestyle positively impacts risk factors.  Exercise Biomechanics Clinical staff led group instruction and group discussion with PowerPoint presentation and patient guidebook. To enhance the learning environment the use of posters, models and videos may be added. Patients will learn how the structural parts of their bodies function and how these functions impact their daily activities, movement, and exercise. Patients will learn how to promote a neutral spine, learn how to manage pain, and identify ways to improve their physical movement in order to promote healthy living. The purpose of this lesson is to  expose patients to common physical limitations that impact physical activity. Participants will learn practical ways to adapt and manage aches and pains, and to minimize their effect on regular exercise. Patients will learn how to maintain good posture while sitting, walking, and lifting.  Balance Training and Fall Prevention  Clinical staff led group instruction and group discussion with PowerPoint presentation and patient guidebook. To  enhance the learning environment the use of posters, models and videos may be added. At the conclusion of this workshop, patients will understand the importance of their sensorimotor skills (vision, proprioception, and the vestibular system) in maintaining their ability to balance as they age. Patients will apply a variety of balancing exercises that are appropriate for their current level of function. Patients will understand the common causes for poor balance, possible solutions to these problems, and ways to modify their physical environment in order to minimize their fall risk. The purpose of this lesson is to teach patients about the importance of maintaining balance as they age and ways to minimize their risk of falling.  WORKSHOPS   Nutrition:  Fueling a Ship broker led group instruction and group discussion with PowerPoint presentation and patient guidebook. To enhance the learning environment the use of posters, models and videos may be added. Patients will review the foundational principles of the Pritikin Eating Plan and understand what constitutes a serving size in each of the food groups. Patients will also learn Pritikin-friendly foods that are better choices when away from home and review make-ahead meal and snack options. Calorie density will be reviewed and applied to three nutrition priorities: weight maintenance, weight loss, and weight gain. The purpose of this lesson is to reinforce (in a group setting) the key concepts around what patients are recommended to eat and how to apply these guidelines when away from home by planning and selecting Pritikin-friendly options. Patients will understand how calorie density may be adjusted for different weight management goals.  Mindful Eating  Clinical staff led group instruction and group discussion with PowerPoint presentation and patient guidebook. To enhance the learning environment the use of posters, models and videos may  be added. Patients will briefly review the concepts of the Pritikin Eating Plan and the importance of low-calorie dense foods. The concept of mindful eating will be introduced as well as the importance of paying attention to internal hunger signals. Triggers for non-hunger eating and techniques for dealing with triggers will be explored. The purpose of this lesson is to provide patients with the opportunity to review the basic principles of the Pritikin Eating Plan, discuss the value of eating mindfully and how to measure internal cues of hunger and fullness using the Hunger Scale. Patients will also discuss reasons for non-hunger eating and learn strategies to use for controlling emotional eating.  Targeting Your Nutrition Priorities Clinical staff led group instruction and group discussion with PowerPoint presentation and patient guidebook. To enhance the learning environment the use of posters, models and videos may be added. Patients will learn how to determine their genetic susceptibility to disease by reviewing their family history. Patients will gain insight into the importance of diet as part of an overall healthy lifestyle in mitigating the impact of genetics and other environmental insults. The purpose of this lesson is to provide patients with the opportunity to assess their personal nutrition priorities by looking at their family history, their own health history and current risk factors. Patients will also be able to discuss ways of prioritizing and modifying the  Pritikin Eating Plan for their highest risk areas  Menu  Clinical staff led group instruction and group discussion with PowerPoint presentation and patient guidebook. To enhance the learning environment the use of posters, models and videos may be added. Using menus brought in from [employer], or printed from [employer], patients will apply the Pritikin dining out guidelines that were presented in the [employer] video. Patients will also be able to practice these guidelines in a variety of provided scenarios. The purpose of this lesson is to provide patients with the opportunity to practice hands-on learning of the Pritikin Dining Out guidelines with actual menus and practice scenarios.  Label Reading Clinical staff led group instruction and group discussion with PowerPoint presentation and patient guidebook. To enhance the learning environment the use of posters, models and videos may be added. Patients will review and discuss the Pritikin label reading guidelines presented in Pritikin's Label Reading Educational series video. Using fool labels brought in from local grocery stores and markets, patients will apply the label reading guidelines and determine if the packaged food meet the Pritikin guidelines. The purpose of this lesson is to provide patients with the opportunity to review, discuss, and practice hands-on learning of the Pritikin Label Reading guidelines with actual packaged food labels. Cooking School  Pritikin's [employer] are designed to teach patients ways to prepare quick, simple, and affordable recipes at home. The importance of nutrition's role in chronic disease risk reduction is reflected in its emphasis in the overall Pritikin program. By learning how to prepare essential core Pritikin Eating Plan recipes, patients will increase control over what they eat; be able to customize the flavor of foods without the use of added salt, sugar, or fat; and improve the quality of the food they consume. By learning a set of core recipes which are easily assembled, quickly prepared, and affordable, patients are more likely to prepare more healthy foods at home. These workshops focus on convenient breakfasts, simple entres, side dishes, and desserts which can be prepared with minimal effort and are consistent with nutrition recommendations for cardiovascular risk reduction. Cooking  [employer] are taught by a Armed forces logistics/support/administrative officer (RD) who has been trained by the [employer]. The chef or RD has a clear understanding of the importance of minimizing - if not completely eliminating - added fat, sugar, and sodium in recipes. Throughout the series of Cooking School Workshop sessions, patients will learn about healthy ingredients and efficient methods of cooking to build confidence in their capability to prepare    Cooking School weekly topics:  Adding Flavor- Sodium-Free  Fast and Healthy Breakfasts  Powerhouse Plant-Based Proteins  Satisfying Salads and Dressings  Simple Sides and Sauces  International Cuisine-Spotlight on the [employer] Zones  Delicious Desserts  Savory Soups  [employer] - Meals in a Astronomer Appetizers and Snacks  Comforting Weekend Breakfasts  One-Pot Wonders   Fast Evening Meals  Landscape architect Your Pritikin Plate  WORKSHOPS   Healthy Mindset (Psychosocial):  Focused Goals, Sustainable Changes Clinical staff led group instruction and group discussion with PowerPoint presentation and patient guidebook. To enhance the learning environment the use of posters, models and videos may be added. Patients will be able to apply effective goal setting strategies to establish at least one personal goal, and then take consistent, meaningful action toward that goal. They will learn to identify common barriers to achieving personal goals and develop strategies to overcome them.  Patients will also gain an understanding of how our mind-set can impact our ability to achieve goals and the importance of cultivating a positive and growth-oriented mind-set. The purpose of this lesson is to provide patients with a deeper understanding of how to set and achieve personal goals, as well as the tools and strategies needed to overcome common obstacles which may arise along the way.  From Head to Heart: The Power of a Healthy  Outlook  Clinical staff led group instruction and group discussion with PowerPoint presentation and patient guidebook. To enhance the learning environment the use of posters, models and videos may be added. Patients will be able to recognize and describe the impact of emotions and mood on physical health. They will discover the importance of self-care and explore self-care practices which may work for them. Patients will also learn how to utilize the 4 C's to cultivate a healthier outlook and better manage stress and challenges. The purpose of this lesson is to demonstrate to patients how a healthy outlook is an essential part of maintaining good health, especially as they continue their cardiac rehab journey.  Healthy Sleep for a Healthy Heart Clinical staff led group instruction and group discussion with PowerPoint presentation and patient guidebook. To enhance the learning environment the use of posters, models and videos may be added. At the conclusion of this workshop, patients will be able to demonstrate knowledge of the importance of sleep to overall health, well-being, and quality of life. They will understand the symptoms of, and treatments for, common sleep disorders. Patients will also be able to identify daytime and nighttime behaviors which impact sleep, and they will be able to apply these tools to help manage sleep-related challenges. The purpose of this lesson is to provide patients with a general overview of sleep and outline the importance of quality sleep. Patients will learn about a few of the most common sleep disorders. Patients will also be introduced to the concept of "sleep hygiene," and discover ways to self-manage certain sleeping problems through simple daily behavior changes. Finally, the workshop will motivate patients by clarifying the links between quality sleep and their goals of heart-healthy living.   Recognizing and Reducing Stress Clinical staff led group instruction and  group discussion with PowerPoint presentation and patient guidebook. To enhance the learning environment the use of posters, models and videos may be added. At the conclusion of this workshop, patients will be able to understand the types of stress reactions, differentiate between acute and chronic stress, and recognize the impact that chronic stress has on their health. They will also be able to apply different coping mechanisms, such as reframing negative self-talk. Patients will have the opportunity to practice a variety of stress management techniques, such as deep abdominal breathing, progressive muscle relaxation, and/or guided imagery.  The purpose of this lesson is to educate patients on the role of stress in their lives and to provide healthy techniques for coping with it.  Learning Barriers/Preferences:  Learning Barriers/Preferences - 11/29/22 1227       Learning Barriers/Preferences   Learning Barriers Sight;Hearing   wears glasses/ hearing loss left ear   Learning Preferences Skilled Demonstration;Individual Instruction;Computer/Internet;Video;Pictoral             Education Topics:  Knowledge Questionnaire Score:  Knowledge Questionnaire Score - 11/29/22 1352       Knowledge Questionnaire Score   Pre Score 20/24             Core Components/Risk Factors/Patient Goals at  Admission:  Personal Goals and Risk Factors at Admission - 11/29/22 1234       Core Components/Risk Factors/Patient Goals on Admission   Hypertension Yes    Intervention Provide education on lifestyle modifcations including regular physical activity/exercise, weight management, moderate sodium restriction and increased consumption of fresh fruit, vegetables, and low fat dairy, alcohol moderation, and smoking cessation.;Monitor prescription use compliance.    Expected Outcomes Short Term: Continued assessment and intervention until BP is < 140/21mm HG in hypertensive participants. < 130/68mm HG in  hypertensive participants with diabetes, heart failure or chronic kidney disease.;Long Term: Maintenance of blood pressure at goal levels.    Lipids Yes    Intervention Provide education and support for participant on nutrition & aerobic/resistive exercise along with prescribed medications to achieve LDL 70mg , HDL >40mg .    Expected Outcomes Short Term: Participant states understanding of desired cholesterol values and is compliant with medications prescribed. Participant is following exercise prescription and nutrition guidelines.;Long Term: Cholesterol controlled with medications as prescribed, with individualized exercise RX and with personalized nutrition plan. Value goals: LDL < 70mg , HDL > 40 mg.             Core Components/Risk Factors/Patient Goals Review:    Core Components/Risk Factors/Patient Goals at Discharge (Final Review):    ITP Comments:  ITP Comments     Row Name 11/29/22 1045 12/05/22 1631         ITP Comments Armanda Magic, MD:  Medical Director. Introduction to the [facility] / Intensive Cardiac Rehab.  Initial orientation packet reviewed with the patient. 30 Day ITP Review. Algernon Huxley started cardiac rehab on 12/05/22 and did well with exercise               Comments: Pt started cardiac rehab today.  Pt tolerated light exercise without difficulty. VSS, telemetry-Sinus Rhythm, asymptomatic.  Medication list reconciled. Pt denies barriers to medicaiton compliance.  PSYCHOSOCIAL ASSESSMENT:  PHQ-7. Pt exhibits positive coping skills, hopeful outlook with supportive family. No psychosocial needs identified at this time, no psychosocial interventions necessary.    Pt enjoys woodworking, camping, reading and puzzles.   Pt oriented to exercise equipment and routine.    Understanding verbalized. Thayer Headings RN BSN

## 2022-12-07 ENCOUNTER — Encounter (HOSPITAL_COMMUNITY)
Admission: RE | Admit: 2022-12-07 | Discharge: 2022-12-07 | Disposition: A | Discharge status: Home / Self Care | Payer: Medicare Other | Source: Ambulatory Visit | Attending: Cardiology | Admitting: Cardiology

## 2022-12-07 DIAGNOSIS — Z951 Presence of aortocoronary bypass graft: Secondary | ICD-10-CM

## 2022-12-07 DIAGNOSIS — Z952 Presence of prosthetic heart valve: Secondary | ICD-10-CM

## 2022-12-10 ENCOUNTER — Telehealth: Payer: Self-pay

## 2022-12-10 ENCOUNTER — Encounter (HOSPITAL_COMMUNITY)
Admission: RE | Admit: 2022-12-10 | Discharge: 2022-12-10 | Disposition: A | Discharge status: Home / Self Care | Payer: Medicare Other | Source: Ambulatory Visit | Attending: Cardiology | Admitting: Cardiology

## 2022-12-10 DIAGNOSIS — Z951 Presence of aortocoronary bypass graft: Secondary | ICD-10-CM | POA: Diagnosis not present

## 2022-12-10 DIAGNOSIS — Z952 Presence of prosthetic heart valve: Secondary | ICD-10-CM

## 2022-12-10 NOTE — Telephone Encounter (Signed)
-----   Message from Quintella Reichert, MD sent at 12/10/2022  2:57 PM EDT ----- Please have him cut his Flomax back to 0.4mg  daily and let me know what BP does in rehab the rest of the week ----- Message ----- From: Cammy Copa, RN Sent: 12/10/2022   2:44 PM EDT To: Quintella Reichert, MD; Frutoso Schatz, RN  Good afternoon Dr Mayford Knife, I want to bring it your attention that Mr Theodore Patton reports that he continues to feel lightheaded on a daily basis denies feeling lightheaded today. Entry blood pressure 110/78. Heart rate 82. Post blood exercise 92/60 heart rate 80. Patient asymptomatic. Given water. Recheck BP 99/65. Medications reviewed. Taking as prescribed. Sitting BP 110/78. Standing BP 92/60. I asked Bharat to make sure that he is hydrating enough throughout the day. Mr Theodore Patton is taking 3.125 of coreg twice a day. Mr Theodore Patton will follow up with you in September. Please review attached vital signs from cardiac rehab and advise as necessary,  Thanks for your input! [facility]  Cardiac Rehab

## 2022-12-10 NOTE — Progress Notes (Signed)
Theodore Patton, [name] reports that he continues to feel lightheaded on a daily basis denies feeling lightheaded today. Entry blood pressure 110/78. Heart rate 82. Post blood exercise 92/60 heart rate 80. Patient asymptomatic. Given water. Recheck BP 99/65. Medications reviewed. Taking as prescribed. Sitting BP 110/78. Standing BP 92/60. I asked [name] to make sure that he is hydrating enough throughout the day. Will forward today's vital signs to Dr Mayford Knife for review. Will continue to monitor the patient throughout  the program. Thayer Headings RN BSN

## 2022-12-10 NOTE — Telephone Encounter (Signed)
Spoke with the patient and he is aware to cut back his flomax to 0.4 mg daily

## 2022-12-11 NOTE — Telephone Encounter (Signed)
Called to discuss flomax with patient, patient states he was already contacted about his flomax by our office and plans to talk with prescriber Dr. Felipa Eth about it. Patient verbalizes in the meantime he is taking 0.4 mg flomax daily.

## 2022-12-12 ENCOUNTER — Encounter (HOSPITAL_COMMUNITY)
Admission: RE | Admit: 2022-12-12 | Discharge: 2022-12-12 | Disposition: A | Discharge status: Home / Self Care | Payer: Medicare Other | Source: Ambulatory Visit | Attending: Cardiology | Admitting: Cardiology

## 2022-12-12 DIAGNOSIS — Z951 Presence of aortocoronary bypass graft: Secondary | ICD-10-CM

## 2022-12-12 DIAGNOSIS — Z952 Presence of prosthetic heart valve: Secondary | ICD-10-CM

## 2022-12-12 NOTE — Telephone Encounter (Signed)
Call to patient to discuss Dr. Norris Cross recommendation that he check his BP BID for one week and call us with results. Patient states he knows to check at lunch and at dinner, 2-3 hours after any blood pressures medication administration.

## 2022-12-12 NOTE — Progress Notes (Signed)
Alva reported feeling fatigued and said that he felt working out on the arm ergometer. 7 minutes into working out on the nutstep. Dimple Nanas admitted that he ate two breakfast bars and a smoothie for lunch. Blood pressure 121/80 heart rate 95. Telemetry rhythm Sinus. Patient was given a ginger ale, graham crackers and peanut butter. Tammy Sours reported feeling better after eating a snack. I advised Tammy Sours to eat a healthy lunch before coming to exercise. Patient and wife state understand. RD Sam said she will given the patients information on heart healthy lunch ideas during cooking school.Will continue to monitor the patient throughout  the program. Thayer Headings RN BSN

## 2022-12-14 ENCOUNTER — Encounter (HOSPITAL_COMMUNITY)
Admission: RE | Admit: 2022-12-14 | Discharge: 2022-12-14 | Disposition: A | Discharge status: Home / Self Care | Payer: Medicare Other | Source: Ambulatory Visit | Attending: Cardiology | Admitting: Cardiology

## 2022-12-14 DIAGNOSIS — Z952 Presence of prosthetic heart valve: Secondary | ICD-10-CM

## 2022-12-14 DIAGNOSIS — Z951 Presence of aortocoronary bypass graft: Secondary | ICD-10-CM

## 2022-12-17 ENCOUNTER — Encounter (HOSPITAL_COMMUNITY)
Admission: RE | Admit: 2022-12-17 | Discharge: 2022-12-17 | Disposition: A | Discharge status: Home / Self Care | Payer: Medicare Other | Source: Ambulatory Visit | Attending: Cardiology | Admitting: Cardiology

## 2022-12-17 DIAGNOSIS — Z952 Presence of prosthetic heart valve: Secondary | ICD-10-CM

## 2022-12-17 DIAGNOSIS — Z951 Presence of aortocoronary bypass graft: Secondary | ICD-10-CM

## 2022-12-17 NOTE — Progress Notes (Signed)
Incomplete Session Note  Patient Details  Name: AVENIR LOZINSKI MRN: [id number] Date of Birth: 1945/08/28 Referring Provider:   Flowsheet Row INTENSIVE CARDIAC REHAB ORIENT from 11/29/2022 in [facility]  Referring Provider Armanda Magic, MD       Ezra Sites did not complete his rehab session.  Tammy Sours said that he has experienced lower back problems and left hip pain and weakness. Tammy Sours said that his left leg gave out this morning while in his workshop and that he fell. Tammy Sours said said that he did not sustain any injury. Tammy Sours says he is meeting with his physical therapist tomorrow and plans to call his primary care provider, Dr Felipa Eth to discuss. Tammy Sours did not have any complaints upon exit from cardiac rehab.Thayer Headings RN BSN

## 2022-12-19 ENCOUNTER — Encounter (HOSPITAL_COMMUNITY): Admission: RE | Admit: 2022-12-19 | Payer: Medicare Other | Source: Ambulatory Visit

## 2022-12-19 DIAGNOSIS — Z951 Presence of aortocoronary bypass graft: Secondary | ICD-10-CM | POA: Diagnosis not present

## 2022-12-19 DIAGNOSIS — Z952 Presence of prosthetic heart valve: Secondary | ICD-10-CM

## 2022-12-21 ENCOUNTER — Encounter (HOSPITAL_COMMUNITY): Payer: Medicare Other

## 2022-12-24 ENCOUNTER — Encounter (HOSPITAL_COMMUNITY)
Admission: RE | Admit: 2022-12-24 | Discharge: 2022-12-24 | Disposition: A | Discharge status: Home / Self Care | Payer: Medicare Other | Source: Ambulatory Visit | Attending: Cardiology | Admitting: Cardiology

## 2022-12-24 DIAGNOSIS — Z951 Presence of aortocoronary bypass graft: Secondary | ICD-10-CM | POA: Diagnosis not present

## 2022-12-24 DIAGNOSIS — Z952 Presence of prosthetic heart valve: Secondary | ICD-10-CM

## 2022-12-25 ENCOUNTER — Telehealth: Payer: Self-pay | Admitting: Cardiology

## 2022-12-25 NOTE — Telephone Encounter (Signed)
Pt called in to report his BP that he took at noon and at bedtime, per Dr. Malachy Mood request.  12/10/22 - 12/25/22 7/8 NOON 113/69  7/9 NOON 131/85 7/9 EVENING 110/77 7/10 NOON 109/71 7/10 EVENING 96/64 7/11 NOON 101/68 7/11 EVENING 86/59  7/12 NOON 126/79 7/12 EVENING 91/63 7/13 NOON 109/71 7/13 EVENING 127/85 7/14 NOON 108/64 7/14 EVENING 111/78 7/15 NOON 110/71 7/15 EVENING 95/61 7/16 NOON 112/78 7/16 EVENING 105/61 7/17 NOON 122/71 7/17 EVENING 109/69 7/18 EVENING 108/68 7/19 EVENING 113/78 7/21 EVENING 125/77 7/22 NOON 114/75 7/22 EVENING 113/78 7/23 NOON 129/81

## 2022-12-25 NOTE — Telephone Encounter (Signed)
Called patient to check on BP log, patient away from home and states he will call in with log tomorrow.

## 2022-12-26 ENCOUNTER — Encounter (HOSPITAL_COMMUNITY): Admission: RE | Admit: 2022-12-26 | Payer: Medicare Other | Source: Ambulatory Visit

## 2022-12-26 DIAGNOSIS — Z951 Presence of aortocoronary bypass graft: Secondary | ICD-10-CM

## 2022-12-26 DIAGNOSIS — Z952 Presence of prosthetic heart valve: Secondary | ICD-10-CM

## 2022-12-27 NOTE — Telephone Encounter (Signed)
Spoke with the patient and advised him per Dr. Mayford Knife to stop taking carvedilol. He will check his blood pressure twice daily for the next week and call us with readings.  He would like to know if he can go back to taking Flomax twice daily. He was advised to decrease it to once daily on 7/8 due to symptomatic hypotension at cardiac rehab.

## 2022-12-27 NOTE — Telephone Encounter (Signed)
Advised patient to stay on once daily Flomax per Dr. Mayford Knife.

## 2022-12-28 ENCOUNTER — Encounter (HOSPITAL_COMMUNITY)
Admission: RE | Admit: 2022-12-28 | Discharge: 2022-12-28 | Disposition: A | Discharge status: Home / Self Care | Payer: Medicare Other | Source: Ambulatory Visit | Attending: Cardiology | Admitting: Cardiology

## 2022-12-28 DIAGNOSIS — Z952 Presence of prosthetic heart valve: Secondary | ICD-10-CM

## 2022-12-28 DIAGNOSIS — Z951 Presence of aortocoronary bypass graft: Secondary | ICD-10-CM

## 2022-12-31 ENCOUNTER — Ambulatory Visit (HOSPITAL_COMMUNITY): Payer: Medicare Other | Attending: Cardiology

## 2022-12-31 ENCOUNTER — Encounter (HOSPITAL_COMMUNITY): Admission: RE | Admit: 2022-12-31 | Payer: Medicare Other | Source: Ambulatory Visit

## 2022-12-31 DIAGNOSIS — Z951 Presence of aortocoronary bypass graft: Secondary | ICD-10-CM

## 2022-12-31 DIAGNOSIS — Z952 Presence of prosthetic heart valve: Secondary | ICD-10-CM

## 2022-12-31 LAB — ECHOCARDIOGRAM LIMITED

## 2022-12-31 MED ORDER — PERFLUTREN LIPID MICROSPHERE
1.0000 mL | INTRAVENOUS | Status: AC | PRN
Start: 2022-12-31 — End: 2022-12-31
  Administered 2022-12-31: 4 mL via INTRAVENOUS

## 2023-01-01 NOTE — Progress Notes (Incomplete)
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 Discontinue once prescription runs out (Patient not taking: Reported on 11/29/2022), Disp: , Rfl:    aspirin EC 81 MG tablet, Take 1 tablet (81 mg total) by mouth daily. Swallow whole., Disp: 150 tablet, Rfl: 2   buPROPion (WELLBUTRIN XL) 150 MG 24 hr tablet, Take 300 mg by mouth in the morning., Disp: , Rfl:    cetirizine (ZYRTEC) 10 MG tablet, Take 10 mg by mouth at bedtime., Disp: , Rfl:    colchicine 0.6 MG tablet, Take 0.6 mg by mouth every 6 (six) hours as needed (gout flares). , Disp: , Rfl:    famotidine (PEPCID) 20 MG tablet, Take 20 mg by mouth at bedtime., Disp: , Rfl:    Fe Fum-Vit C-Vit B12-FA (TRIGELS-F FORTE) CAPS capsule, Take 1 capsule by mouth daily after breakfast. May substitute with similar generic medication (Patient not taking: Reported on 11/29/2022), Disp: 30 capsule, Rfl: 0   fluticasone (FLONASE) 50 MCG/ACT nasal spray, Place 1-2 sprays into both nostrils at bedtime as needed for allergies., Disp: , Rfl:    gabapentin (NEURONTIN) 300 MG capsule, Take 600 mg by  mouth 3 (three) times daily., Disp: , Rfl:    Glucosamine-Chondroitin (COSAMIN DS PO), Take 1 tablet by mouth 2 (two) times daily. Triple Strength, Disp: , Rfl:    Multiple Vitamin (MULTIVITAMIN WITH MINERALS) TABS tablet, Take 1 tablet by mouth daily. Complete Multivitamin, Disp: , Rfl:    Probiotic Product (ALIGN) 4 MG CAPS, Take 4 mg by mouth daily as needed (gut health)., Disp: , Rfl:    rosuvastatin (CRESTOR) 10 MG tablet, Take 10 mg by mouth in the morning., Disp: , Rfl:    SALINE NASAL MIST NA, Place 1 spray into the nose 3 (three) times daily as needed (congestion)., Disp: , Rfl:    tamsulosin (FLOMAX) 0.4 MG CAPS capsule, Take 0.4 mg by mouth every evening., Disp: , Rfl:    TURMERIC PO, Take 1,000 mg by mouth in the morning and at bedtime., Disp: , Rfl:   Past Medical History: Past Medical History:  Diagnosis Date   Aortic regurgitation 01/07/2019   severe and eccentric by TEE [id number] with RF 69%.   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    Tobacco Use: Social History   Tobacco Use  Smoking Status Former   Current packs/day: 0.00   Types: Cigarettes   Quit date: 07/12/1980   Years since quitting: 42.5  Smokeless Tobacco Never    Labs: Review Flowsheet  More data exists      Latest Ref Rng & Units 09/28/2022 10/16/2022 10/18/2022 10/19/2022 11/02/2022  Labs for ITP Cardiac and Pulmonary Rehab  Cholestrol 100 - 199 mg/dL - - - - 90   LDL (calc) 0 - 99 mg/dL - - - - 32   HDL-C >16 mg/dL - - - - 37   Trlycerides 0 - 149 mg/dL - - - - 109   Hemoglobin A1c 4.8 -  5.6 % - 5.5  - - -  PH, Arterial 7.35 - 7.45 7.326  7.4  7.411  7.396  7.335  7.371  7.365  7.390  -  PCO2 arterial 32 - 48 mmHg 46.3  35  32.8  35.9  41.7  42.2  40.2  38.7  -  Bicarbonate 20.0 - 28.0 mmol/L 23.1  24.4  24.2  21.7  21.2  22.0  22.2  24.4  22.9  23.2  -  TCO2 22 - 32 mmol/L 24  26  26   - 22  22  23  24  24  23  26  26  27  24  24   -  Acid-base deficit 0.0 - 2.0 mmol/L 3.0  2.0  2.0  2.5  3.0  3.0  3.0  1.0  2.0  1.0  -  O2 Saturation % 67  69  93  98  97  99  100  100  99  99  -    Details       Multiple values from one day are sorted in reverse-chronological order         Capillary Blood Glucose: Lab Results  Component Value Date   GLUCAP 106 (H) 10/23/2022   GLUCAP 124 (H) 10/22/2022   GLUCAP 112 (H) 10/21/2022   GLUCAP 114 (H) 10/21/2022   GLUCAP 133 (H) 10/21/2022     Exercise Target Goals: Exercise Program Goal: Individual exercise prescription set using results from initial 6 min walk test and THRR while considering  patient's activity barriers and safety.   Exercise Prescription Goal: Initial exercise prescription builds to 30-45 minutes a day of aerobic activity, 2-3 days per week.  Home exercise guidelines will be given to patient during program as part of exercise prescription that the participant will acknowledge.  Activity Barriers & Risk Stratification:  Activity Barriers & Cardiac Risk Stratification - 11/29/22 1224       Activity Barriers & Cardiac Risk Stratification   Activity Barriers Arthritis;Back Problems;Right Knee Replacement;Joint Problems;Deconditioning;Muscular Weakness;Balance Concerns;Other (comment)    Comments Sternal Precautions    Cardiac Risk Stratification High             6 Minute Walk:  6 Minute Walk     Row Name 11/29/22 1142         6 Minute Walk   Phase Initial  Used Go-cart     Distance 1233 feet     Walk Time 6 minutes     # of Rest Breaks 0     MPH 2.34     METS 2.45     RPE 10  Perceived  Dyspnea  0     VO2 Peak 8.6     Symptoms No     Resting HR 75 bpm     Resting BP 116/80     Resting Oxygen Saturation  98 %     Exercise Oxygen Saturation  during 6 min walk 98 %     Max Ex. HR 90 bpm     Max Ex. BP 110/70     2 Minute Post BP 120/80              Oxygen Initial Assessment:   Oxygen Re-Evaluation:   Oxygen Discharge (Final Oxygen Re-Evaluation):   Initial Exercise Prescription:  Initial Exercise Prescription - 11/29/22 1200       Date of Initial Exercise RX and Referring Provider   Date 11/29/22    Referring Provider Armanda Magic, MD    Expected Discharge Date 02/08/23      NuStep   Level 1    SPM 80    Minutes 15    METs 2.45      Arm Ergometer   Level 1    Watts 25    RPM 60    Minutes 15    METs 2.45      Prescription Details   Frequency (times per week) 3    Duration Progress to 30 minutes of continuous aerobic without signs/symptoms of physical distress      Intensity   THRR 40-80% of Max Heartrate 58-116    Ratings of Perceived Exertion 11-13    Perceived Dyspnea 0-4      Progression   Progression Continue progressive overload as per policy without signs/symptoms or physical distress.      Resistance Training   Training Prescription Yes    Weight 3 lbs    Reps 10-15             Perform Capillary Blood Glucose checks as needed.  Exercise Prescription Changes:   Exercise Prescription Changes     Row Name 12/05/22 1400 12/24/22 1500 12/26/22 1000         Response to Exercise   Blood Pressure (Admit) 114/80 120/68 --     Blood Pressure (Exercise) 120/80 146/84 --     Blood Pressure (Exit) 102/68 122/64 --     Heart Rate (Admit) 69 bpm 79 bpm --     Heart Rate (Exercise) 96 bpm 108 bpm --     Heart Rate (Exit) 78 bpm 79 bpm --     Rating of Perceived Exertion (Exercise) 11.5 11 --     Symptoms None None --     Comments Pt's first day in the CRP2 program Reviewed METs --     Duration Continue with 30 min of  aerobic exercise without signs/symptoms of physical distress. Continue with 30 min of aerobic exercise without signs/symptoms of physical distress. --     Intensity THRR unchanged THRR unchanged --       Progression   Progression Continue to progress workloads to maintain intensity without signs/symptoms of physical distress. Continue to progress workloads to maintain intensity without signs/symptoms of physical distress. --     Average METs 1.85 2.2 --       Resistance Training   Training Prescription No Yes --     Weight No weights on Wednesdays 3 lbs --     Reps -- 10-15 --     Time -- 10 Minutes --       Interval Training  Interval Training No No --       NuStep   Level 1 1 --     SPM -- 98 --     Minutes 15 15 --     METs 2.2 2.7 --       Arm Ergometer   Level 1 1 --     Watts 9 11 --     RPM 39 -- --     Minutes 15 15 --     METs 1.5 1.7 --              Exercise Comments:   Exercise Comments     Row Name 12/05/22 1422 12/24/22 1500 12/26/22 1059       Exercise Comments Pt's first day in the CRP2 program. Pt exercised without complaints. Reviewed METs. Pt is progressing. --              Exercise Goals and Review:   Exercise Goals     Row Name 11/29/22 1214             Exercise Goals   Increase Physical Activity Yes       Intervention Provide advice, education, support and counseling about physical activity/exercise needs.;Develop an individualized exercise prescription for aerobic and resistive training based on initial evaluation findings, risk stratification, comorbidities and participant's personal goals.       Expected Outcomes Short Term: Attend rehab on a regular basis to increase amount of physical activity.;Long Term: Add in home exercise to make exercise part of routine and to increase amount of physical activity.;Long Term: Exercising regularly at least 3-5 days a week.       Increase Strength and Stamina Yes       Intervention Provide  advice, education, support and counseling about physical activity/exercise needs.;Develop an individualized exercise prescription for aerobic and resistive training based on initial evaluation findings, risk stratification, comorbidities and participant's personal goals.       Expected Outcomes Short Term: Increase workloads from initial exercise prescription for resistance, speed, and METs.;Short Term: Perform resistance training exercises routinely during rehab and add in resistance training at home;Long Term: Improve cardiorespiratory fitness, muscular endurance and strength as measured by increased METs and functional capacity ( )       Able to understand and use rate of perceived exertion (RPE) scale Yes       Intervention Provide education and explanation on how to use RPE scale       Expected Outcomes Short Term: Able to use RPE daily in rehab to express subjective intensity level;Long Term:  Able to use RPE to guide intensity level when exercising independently       Knowledge and understanding of Target Heart Rate Range (THRR) Yes       Intervention Provide education and explanation of THRR including how the numbers were predicted and where they are located for reference       Expected Outcomes Short Term: Able to state/look up THRR;Short Term: Able to use daily as guideline for intensity in rehab;Long Term: Able to use THRR to govern intensity when exercising independently       Understanding of Exercise Prescription Yes       Intervention Provide education, explanation, and written materials on patient's individual exercise prescription       Expected Outcomes Short Term: Able to explain program exercise prescription;Long Term: Able to explain home exercise prescription to exercise independently  Exercise Goals Re-Evaluation :  Exercise Goals Re-Evaluation     Row Name 12/05/22 1420             Exercise Goal Re-Evaluation   Exercise Goals Review Increase  Physical Activity;Understanding of Exercise Prescription;Increase Strength and Stamina;Knowledge and understanding of Target Heart Rate Range (THRR);Able to understand and use rate of perceived exertion (RPE) scale       Comments Pt's first day in the CRP2 program. Pt understands the exercise Rx, REP scale and THRR.       Expected Outcomes Will continue to monitor patient and progress exercise workloads as tolerated.                Discharge Exercise Prescription (Final Exercise Prescription Changes):  Exercise Prescription Changes - 12/26/22 1000       Response to Exercise   Blood Pressure (Admit) --    Blood Pressure (Exercise) --    Blood Pressure (Exit) --    Heart Rate (Admit) --    Heart Rate (Exercise) --    Heart Rate (Exit) --    Rating of Perceived Exertion (Exercise) --    Symptoms --    Comments --    Duration --    Intensity --      Progression   Progression --    Average METs --      Resistance Training   Training Prescription --    Weight --    Reps --    Time --      Interval Training   Interval Training --      NuStep   Level --    SPM --    Minutes --    METs --      Arm Ergometer   Level --    Watts --    Minutes --    METs --             Nutrition:  Target Goals: Understanding of nutrition guidelines, daily intake of sodium 1500mg , cholesterol 200mg , calories 30% from fat and 7% or less from saturated fats, daily to have 5 or more servings of fruits and vegetables.  Biometrics:  Pre Biometrics - 11/29/22 1055       Pre Biometrics   Waist Circumference 40.5 inches    Hip Circumference 40 inches    Waist to Hip Ratio 1.01 %    Triceps Skinfold 12 mm    Grip Strength 42 kg    Flexibility 0 in   could not reach   Single Leg Stand 2 seconds             Post Biometrics - 11/29/22 1055        Post  Biometrics   % Body Fat 26.2 %             Nutrition Therapy Plan and Nutrition Goals:  Nutrition Therapy & Goals  - 12/24/22 0941       Nutrition Therapy   Diet Heart Healthy Diet    Drug/Food Interactions Statins/Certain Fruits      Personal Nutrition Goals   Nutrition Goal Patient to identify strategies for reducing cardiovascular risk by attending the [employer] education and nutrition series weekly.   goal in action.   Personal Goal #2 Patient to improve diet quality by using the plate method as a guide for meal planning to include lean protein/plant protein, fruits, vegetables, whole grains, nonfat dairy as part of a well-balanced diet.   goal in action.  Personal Goal #3 Patient to reduce sodium to 1500mg  per day   goal in action.   Personal Goal #4 Patient to identify strategies for weight maintenance and weight gain of 0.5-2.0# as needed.   goal in progress.   Comments Goals in action. Tammy Sours and his wife continue to attend the [employer] education and nutrition series regularly. They have started making many dietary changes including implemented many [employer] recipes, increased dietary fiber, and reduced saturated fat intake. Tammy Sours does report history of weight loss since shoulder surgery 03/2022 and cardiac surgery in 10/2022; his weight prior to first surgery was 196# per documentation on 02/12/2022. He is motivated to maintain his current weight and possibly gain a little weight back. We discussed strategies for weight maintenance including increasing portion sizes of fat/protein/carbohydrates and increasing eating frequency. Patient will benefit from participation in intensive cardiac rehab for nutrition, exercise, and lifestyle modification.      Intervention Plan   Intervention Prescribe, educate and counsel regarding individualized specific dietary modifications aiming towards targeted core components such as weight, hypertension, lipid management, diabetes, heart failure and other comorbidities.;Nutrition handout(s) given to patient.    Expected Outcomes Short Term Goal: Understand basic principles of  dietary content, such as calories, fat, sodium, cholesterol and nutrients.;Long Term Goal: Adherence to prescribed nutrition plan.             Nutrition Assessments:  Nutrition Assessments - 12/25/22 0937       Rate Your Plate Scores   Pre Score 58            MEDIFICTS Score Key: ?70 Need to make dietary changes  40-70 Heart Healthy Diet ? 40 Therapeutic Level Cholesterol Diet   Flowsheet Row INTENSIVE CARDIAC REHAB from 12/24/2022 in [facility] Heart, Vascular, & Lung Health  Picture Your Plate Total Score on Admission 58      Picture Your Plate Scores: <16 Unhealthy dietary pattern with much room for improvement. 41-50 Dietary pattern unlikely to meet recommendations for good health and room for improvement. 51-60 More healthful dietary pattern, with some room for improvement.  >60 Healthy dietary pattern, although there may be some specific behaviors that could be improved.    Nutrition Goals Re-Evaluation:  Nutrition Goals Re-Evaluation     Row Name 12/05/22 1329 12/24/22 0941           Goals   Current Weight 190 lb 7.6 oz (86.4 kg) 187 lb 6.3 oz (85 kg)      Comment HDL 37, lipoprotein A WNL, hemoglobin 9.7, hematocrit 29.8 no new labs; most recent labs HDL 37, lipoprotein A WNL, hemoglobin 9.7, hematocrit 29.8      Expected Outcome Tammy Sours reports that he and his wife "eat the wrong things" but are receptive to making dietary/lifestyle changes. Patient will benefit from participation in intensive cardiac rehab for nutrition, exercise, and lifestyle modification. Goals in action. Tammy Sours and his wife continue to attend the [employer] education and nutrition series regularly. They have started making many dietary changes including implemented many Prit[employer]in recipes, increased dietary fiber, and reduced saturated fat intake. Tammy Sours does report history of weight loss since shoulder surgery 03/2022 and cardiac surgery in 10/2022; his weight prior to  first surgery was 196# per documentation on 02/12/2022. He is motivated to maintain his current weight and possibly gain a little weight back. We discussed strategies for weight maintenance including increasing portion sizes of fat/protein/carbohydrates and increasing eating frequency. Patient will benefit from participation in intensive cardiac  rehab for nutrition, exercise, and lifestyle modification.               Nutrition Goals Re-Evaluation:  Nutrition Goals Re-Evaluation     Row Name 12/05/22 1329 12/24/22 0941           Goals   Current Weight 190 lb 7.6 oz (86.4 kg) 187 lb 6.3 oz (85 kg)      Comment HDL 37, lipoprotein A WNL, hemoglobin 9.7, hematocrit 29.8 no new labs; most recent labs HDL 37, lipoprotein A WNL, hemoglobin 9.7, hematocrit 29.8      Expected Outcome Tammy Sours reports that he and his wife "eat the wrong things" but are receptive to making dietary/lifestyle changes. Patient will benefit from participation in intensive cardiac rehab for nutrition, exercise, and lifestyle modification. Goals in action. Tammy Sours and his wife continue to attend the [employer] education and nutrition series regularly. They have started making many dietary changes including implemented many Pritikin recipes, increased dietary fiber, and reduced saturated fat intake. Tammy Sours does report history of weight loss since shoulder surgery 03/2022 and cardiac surgery in 10/2022; his weight prior to first surgery was 196# per documentation on 02/12/2022. He is motivated to maintain his current weight and possibly gain a little weight back. We discussed strategies for weight maintenance including increasing portion sizes of fat/protein/carbohydrates and increasing eating frequency. Patient will benefit from participation in intensive cardiac rehab for nutrition, exercise, and lifestyle modification.               Nutrition Goals Discharge (Final Nutrition Goals Re-Evaluation):  Nutrition Goals Re-Evaluation -  12/24/22 0941       Goals   Current Weight 187 lb 6.3 oz (85 kg)    Comment no new labs; most recent labs HDL 37, lipoprotein A WNL, hemoglobin 9.7, hematocrit 29.8    Expected Outcome Goals in action. Tammy Sours and his wife continue to attend the [employer] education and nutrition series regularly. They have started making many dietary changes including implemented many Prit[employer] recipes, increased dietary fiber, and reduced saturated fat intake. Tammy Sours does report history of weight loss since shoulder surgery 03/2022 and cardiac surgery in 10/2022; his weight prior to first surgery was 196# per documentation on 02/12/2022. He is motivated to maintain his current weight and possibly gain a little weight back. We discussed strategies for weight maintenance including increasing portion sizes of fat/protein/carbohydrates and increasing eating frequency. Patient will benefit from participation in intensive cardiac rehab for nutrition, exercise, and lifestyle modification.             Psychosocial: Target Goals: Acknowledge presence or absence of significant depression and/or stress, maximize coping skills, provide positive support system. Participant is able to verbalize types and ability to use techniques and skills needed for reducing stress and depression.  Initial Review & Psychosocial Screening:  Initial Psych Review & Screening - 11/29/22 1225       Initial Review   Current issues with Current Sleep Concerns;History of Depression   Voices he is not currently depressed. Pt feels his Wellbutrin is working well for him.     Family Dynamics   Good Support System? Yes   Has spouse for support     Barriers   Psychosocial barriers to participate in program There are no identifiable barriers or psychosocial needs.      Screening Interventions   Interventions Encouraged to exercise             Quality of Life Scores:  Quality of Life -  11/29/22 1349       Quality of Life   Select Quality of  Life      Quality of Life Scores   Health/Function Pre 21 %    Socioeconomic Pre 22.14 %    Psych/Spiritual Pre 24 %    Family Pre 20.5 %    GLOBAL Pre 21.78 %            Scores of 19 and below usually indicate a poorer quality of life in these areas.  A difference of  2-3 points is a clinically meaningful difference.  A difference of 2-3 points in the total score of the Quality of Life Index has been associated with significant improvement in overall quality of life, self-image, physical symptoms, and general health in studies assessing change in quality of life.  PHQ-9: Review Flowsheet       11/29/2022  Depression screen PHQ 2/9  Decreased Interest 1  Down, Depressed, Hopeless 0  PHQ - 2 Score 1  Altered sleeping 1  Tired, decreased energy 1  Change in appetite 0  Feeling bad or failure about yourself  0  Trouble concentrating 2  Moving slowly or fidgety/restless 2  Suicidal thoughts 0  PHQ-9 Score 7  Difficult doing work/chores Somewhat difficult    Details           Interpretation of Total Score  Total Score Depression Severity:  1-4 = Minimal depression, 5-9 = Mild depression, 10-14 = Moderate depression, 15-19 = Moderately severe depression, 20-27 = Severe depression   Psychosocial Evaluation and Intervention:   Psychosocial Re-Evaluation:   Psychosocial Discharge (Final Psychosocial Re-Evaluation):   Vocational Rehabilitation: Provide vocational rehab assistance to qualifying candidates.   Vocational Rehab Evaluation & Intervention:  Vocational Rehab - 11/29/22 1234       Initial Vocational Rehab Evaluation & Intervention   Assessment shows need for Vocational Rehabilitation No   Pt is retired            Education: Education Goals: Education classes will be provided on a weekly basis, covering required topics. Participant will state understanding/return demonstration of topics presented.    Education     Row Name 12/05/22 1600      Education   Cardiac Education Topics [employer]   Environmental education officer - Meals in a Snap   Instruction Review Code 1- Verbalizes Understanding   Class Start Time 1400   Class Stop Time 1445   Class Time Calculation (min) 45 min    Row Name 12/07/22 1400     Education   Cardiac Education Topics [facility]   Psychologist, forensic Exercise Education   Exercise Education Move It!   Instruction Review Code 1- Verbalizes Understanding   Class Start Time 1400   Class Stop Time 1445   Class Time Calculation (min) 45 min    Row Name 12/10/22 1400     Education   Cardiac Education Topics [employer]   [employer] Workshops     Workshops   Educator Exercise Physiologist   Select Psychosocial   Psychosocial Workshop Focused Goals, Sustainable Changes   Instruction Review Code 1- Verbalizes Understanding   Class Start Time 1355   Class Stop Time 1437   Class Time Calculation (min) 42 min    Row Name 12/12/22 1600     Education  Cardiac Education Topics [facility]   Orthoptist   Educator Dietitian   Weekly Topic One-Pot Wonders   Instruction Review Code 1- Verbalizes Understanding   Class Start Time 1400   Class Stop Time 1450   Class Time Calculation (min) 50 min    Row Name 12/14/22 1500     Education   Cardiac Education Topics Pritikin   Hospital doctor Education   General Education Hypertension and Heart Disease   Instruction Review Code 1- Verbalizes Understanding   Class Start Time 1400   Class Stop Time 1450   Class Time Calculation (min) 50 min    Row Name 12/19/22 1600     Education   Cardiac Education Topics Pritikin   Customer service manager   Weekly Topic Comforting Weekend Breakfasts    Instruction Review Code 1- Verbalizes Understanding   Class Start Time 1400   Class Stop Time 1445   Class Time Calculation (min) 45 min    Row Name 12/24/22 1500     Education   Cardiac Education Topics Pritikin   Licensed conveyancer Nutrition   Nutrition Facts on Fat   Instruction Review Code 1- Verbalizes Understanding   Class Start Time 1400   Class Stop Time 1440   Class Time Calculation (min) 40 min    Row Name 12/26/22 1500     Education   Cardiac Education Topics Pritikin   Customer service manager   Weekly Topic Fast Evening Meals   Instruction Review Code 1- Verbalizes Understanding   Class Start Time 1400   Class Stop Time 1440   Class Time Calculation (min) 40 min    Row Name 12/28/22 1500     Education   Cardiac Education Topics Pritikin   Licensed conveyancer Nutrition   Nutrition Vitamins and Minerals   Instruction Review Code 1- Verbalizes Understanding   Class Start Time 1400   Class Stop Time 1445   Class Time Calculation (min) 45 min    Row Name 12/31/22 1600     Education   Cardiac Education Topics Pritikin   Geographical information systems officer Psychosocial   Psychosocial Workshop Healthy Sleep for a Healthy Heart   Instruction Review Code 1- Verbalizes Understanding   Class Start Time 1400   Class Stop Time 1455   Class Time Calculation (min) 55 min            Core Videos: Exercise    Move It!  Clinical staff conducted group or individual video education with verbal and written material and guidebook.  Patient learns the recommended Pritikin exercise program. Exercise with the goal of living a long, healthy life. Some of the health benefits of exercise include controlled diabetes, healthier blood pressure levels, improved cholesterol levels, improved heart and  lung capacity, improved sleep, and better body composition. Everyone should speak with their doctor before starting or changing an exercise routine.  Biomechanical Limitations Clinical staff conducted group or individual video education with verbal and written material and guidebook.  Patient learns how biomechanical limitations  can impact exercise and how we can mitigate and possibly overcome limitations to have an impactful and balanced exercise routine.  Body Composition Clinical staff conducted group or individual video education with verbal and written material and guidebook.  Patient learns that body composition (ratio of muscle mass to fat mass) is a key component to assessing overall fitness, rather than body weight alone. Increased fat mass, especially visceral belly fat, can put [place] at increased risk for metabolic syndrome, type 2 diabetes, heart disease, and even death. It is recommended to combine diet and exercise (cardiovascular and resistance training) to improve your body composition. Seek guidance from your physician and exercise physiologist before implementing an exercise routine.  Exercise Action Plan Clinical staff conducted group or individual video education with verbal and written material and guidebook.  Patient learns the recommended strategies to achieve and enjoy long-term exercise adherence, including variety, self-motivation, self-efficacy, and positive decision making. Benefits of exercise include fitness, good health, weight management, more energy, better sleep, less stress, and overall well-being.  Medical   Heart Disease Risk Reduction Clinical staff conducted group or individual video education with verbal and written material and guidebook.  Patient learns our heart is our most vital organ as it circulates oxygen, nutrients, white blood cells, and hormones throughout the entire body, and carries waste away. Data supports a plant-based eating plan like the Pritikin  Program for its effectiveness in slowing progression of and reversing heart disease. The video provides a number of recommendations to address heart disease.   Metabolic Syndrome and Belly Fat  Clinical staff conducted group or individual video education with verbal and written material and guidebook.  Patient learns what metabolic syndrome is, how it leads to heart disease, and how one can reverse it and keep it from coming back. You have metabolic syndrome if you have 3 of the following 5 criteria: abdominal obesity, high blood pressure, high triglycerides, low HDL cholesterol, and high blood sugar.  Hypertension and Heart Disease Clinical staff conducted group or individual video education with verbal and written material and guidebook.  Patient learns that high blood pressure, or hypertension, is very common in the [place]. Hypertension is largely due to excessive salt intake, but other important risk factors include being overweight, physical inactivity, drinking too much alcohol, smoking, and not eating enough potassium from fruits and vegetables. High blood pressure is a leading risk factor for heart attack, stroke, congestive heart failure, dementia, kidney failure, and premature death. Long-term effects of excessive salt intake include stiffening of the arteries and thickening of heart muscle and organ damage. Recommendations include ways to reduce hypertension and the risk of heart disease.  Diseases of Our Time - Focusing on Diabetes Clinical staff conducted group or individual video education with verbal and written material and guidebook.  Patient learns why the best way to stop diseases of our time is prevention, through food and other lifestyle changes. Medicine (such as prescription pills and surgeries) is often only a Band-Aid on the problem, not a long-term solution. Most common diseases of our time include obesity, type 2 diabetes, hypertension, heart disease, and cancer. The  Pritikin Program is recommended and has been proven to help reduce, reverse, and/or prevent the damaging effects of metabolic syndrome.  Nutrition   Overview of the Pritikin Eating Plan  Clinical staff conducted group or individual video education with verbal and written material and guidebook.  Patient learns about the Pritikin Eating Plan for disease risk reduction. The Pritikin Eating Plan emphasizes  a wide variety of unrefined, minimally-processed carbohydrates, like fruits, vegetables, whole grains, and legumes. Go, Caution, and Stop food choices are explained. Plant-based and lean animal proteins are emphasized. Rationale provided for low sodium intake for blood pressure control, low added sugars for blood sugar stabilization, and low added fats and oils for coronary artery disease risk reduction and weight management.  Calorie Density  Clinical staff conducted group or individual video education with verbal and written material and guidebook.  Patient learns about calorie density and how it impacts the Pritikin Eating Plan. Knowing the characteristics of the food you choose will help you decide whether those foods will lead to weight gain or weight loss, and whether you want to consume more or less of them. Weight loss is usually a side effect of the Pritikin Eating Plan because of its focus on low calorie-dense foods.  Label Reading  Clinical staff conducted group or individual video education with verbal and written material and guidebook.  Patient learns about the Pritikin recommended label reading guidelines and corresponding recommendations regarding calorie density, added sugars, sodium content, and whole grains.  Dining Out - Part 1  Clinical staff conducted group or individual video education with verbal and written material and guidebook.  Patient learns that restaurant meals can be sabotaging because they can be so high in calories, fat, sodium, and/or sugar. Patient learns  recommended strategies on how to positively address this and avoid unhealthy pitfalls.  Facts on Fats  Clinical staff conducted group or individual video education with verbal and written material and guidebook.  Patient learns that lifestyle modifications can be just as effective, if not more so, as many medications for lowering your risk of heart disease. A Pritikin lifestyle can help to reduce your risk of inflammation and atherosclerosis (cholesterol build-up, or plaque, in the artery walls). Lifestyle interventions such as dietary choices and physical activity address the cause of atherosclerosis. A review of the types of fats and their impact on blood cholesterol levels, along with dietary recommendations to reduce fat intake is also included.  Nutrition Action Plan  Clinical staff conducted group or individual video education with verbal and written material and guidebook.  Patient learns how to incorporate Pritikin recommendations into their lifestyle. Recommendations include planning and keeping personal health goals in mind as an important part of their success.  Healthy Mind-Set    Healthy Minds, Bodies, Hearts  Clinical staff conducted group or individual video education with verbal and written material and guidebook.  Patient learns how to identify when they are stressed. Video will discuss the impact of that stress, as well as the many benefits of stress management. Patient will also be introduced to stress management techniques. The way we think, act, and feel has an impact on our hearts.  How Our Thoughts Can Heal Our Hearts  Clinical staff conducted group or individual video education with verbal and written material and guidebook.  Patient learns that negative thoughts can cause depression and anxiety. This can result in negative lifestyle behavior and serious health problems. Cognitive behavioral therapy is an effective method to help control our thoughts in order to change and  improve our emotional outlook.  Additional Videos:  Exercise    Improving Performance  Clinical staff conducted group or individual video education with verbal and written material and guidebook.  Patient learns to use a non-linear approach by alternating intensity levels and lengths of time spent exercising to help burn more calories and lose more body fat. Cardiovascular exercise  helps improve heart health, metabolism, hormonal balance, blood sugar control, and recovery from fatigue. Resistance training improves strength, endurance, balance, coordination, reaction time, metabolism, and muscle mass. Flexibility exercise improves circulation, posture, and balance. Seek guidance from your physician and exercise physiologist before implementing an exercise routine and learn your capabilities and proper form for all exercise.  Introduction to Yoga  Clinical staff conducted group or individual video education with verbal and written material and guidebook.  Patient learns about yoga, a discipline of the coming together of mind, breath, and body. The benefits of yoga include improved flexibility, improved range of motion, better posture and core strength, increased lung function, weight loss, and positive self-image. Yoga's heart health benefits include lowered blood pressure, healthier heart rate, decreased cholesterol and triglyceride levels, improved immune function, and reduced stress. Seek guidance from your physician and exercise physiologist before implementing an exercise routine and learn your capabilities and proper form for all exercise.  Medical   Aging: Enhancing Your Quality of Life  Clinical staff conducted group or individual video education with verbal and written material and guidebook.  Patient learns key strategies and recommendations to stay in good physical health and enhance quality of life, such as prevention strategies, having an advocate, securing a Health Care Proxy and Power of  Attorney, and keeping a list of medications and system for tracking them. It also discusses how to avoid risk for bone loss.  Biology of Weight Control  Clinical staff conducted group or individual video education with verbal and written material and guidebook.  Patient learns that weight gain occurs because we consume more calories than we burn (eating more, moving less). Even if your body weight is normal, you may have higher ratios of fat compared to muscle mass. Too much body fat puts you at increased risk for cardiovascular disease, heart attack, stroke, type 2 diabetes, and obesity-related cancers. In addition to exercise, following the Pritikin Eating Plan can help reduce your risk.  Decoding Lab Results  Clinical staff conducted group or individual video education with verbal and written material and guidebook.  Patient learns that lab test reflects one measurement whose values change over time and are influenced by many factors, including medication, stress, sleep, exercise, food, hydration, pre-existing medical conditions, and more. It is recommended to use the knowledge from this video to become more involved with your lab results and evaluate your numbers to speak with your doctor.   Diseases of Our Time - Overview  Clinical staff conducted group or individual video education with verbal and written material and guidebook.  Patient learns that according to the CDC, 50% to 70% of chronic diseases (such as obesity, type 2 diabetes, elevated lipids, hypertension, and heart disease) are avoidable through lifestyle improvements including healthier food choices, listening to satiety cues, and increased physical activity.  Sleep Disorders Clinical staff conducted group or individual video education with verbal and written material and guidebook.  Patient learns how good quality and duration of sleep are important to overall health and well-being. Patient also learns about sleep disorders and  how they impact health along with recommendations to address them, including discussing with a physician.  Nutrition  Dining Out - Part 2 Clinical staff conducted group or individual video education with verbal and written material and guidebook.  Patient learns how to plan ahead and communicate in order to maximize their dining experience in a healthy and nutritious manner. Included are recommended food choices based on the type of restaurant the patient is  visiting.   Fueling a Banker conducted group or individual video education with verbal and written material and guidebook.  There is a strong connection between our food choices and our health. Diseases like obesity and type 2 diabetes are very prevalent and are in large-part due to lifestyle choices. The Pritikin Eating Plan provides plenty of food and hunger-curbing satisfaction. It is easy to follow, affordable, and helps reduce health risks.  Menu Workshop  Clinical staff conducted group or individual video education with verbal and written material and guidebook.  Patient learns that restaurant meals can sabotage health goals because they are often packed with calories, fat, sodium, and sugar. Recommendations include strategies to plan ahead and to communicate with the manager, chef, or server to help order a healthier meal.  Planning Your Eating Strategy  Clinical staff conducted group or individual video education with verbal and written material and guidebook.  Patient learns about the Pritikin Eating Plan and its benefit of reducing the risk of disease. The Pritikin Eating Plan does not focus on calories. Instead, it emphasizes high-quality, nutrient-rich foods. By knowing the characteristics of the foods, we choose, we can determine their calorie density and make informed decisions.  Targeting Your Nutrition Priorities  Clinical staff conducted group or individual video education with verbal and written  material and guidebook.  Patient learns that lifestyle habits have a tremendous impact on disease risk and progression. This video provides eating and physical activity recommendations based on your personal health goals, such as reducing LDL cholesterol, losing weight, preventing or controlling type 2 diabetes, and reducing high blood pressure.  Vitamins and Minerals  Clinical staff conducted group or individual video education with verbal and written material and guidebook.  Patient learns different ways to obtain key vitamins and minerals, including through a recommended healthy diet. It is important to discuss all supplements you take with your doctor.   Healthy Mind-Set    Smoking Cessation  Clinical staff conducted group or individual video education with verbal and written material and guidebook.  Patient learns that cigarette smoking and tobacco addiction pose a serious health risk which affects millions of people. Stopping smoking will significantly reduce the risk of heart disease, lung disease, and many forms of cancer. Recommended strategies for quitting are covered, including working with your doctor to develop a successful plan.  Culinary   Becoming a Set designer conducted group or individual video education with verbal and written material and guidebook.  Patient learns that cooking at home can be healthy, cost-effective, quick, and puts them in control. Keys to cooking healthy recipes will include looking at your recipe, assessing your equipment needs, planning ahead, making it simple, choosing cost-effective seasonal ingredients, and limiting the use of added fats, salts, and sugars.  Cooking - Breakfast and Snacks  Clinical staff conducted group or individual video education with verbal and written material and guidebook.  Patient learns how important breakfast is to satiety and nutrition through the entire day. Recommendations include key foods to eat during  breakfast to help stabilize blood sugar levels and to prevent overeating at meals later in the day. Planning ahead is also a key component.  Cooking - Educational psychologist conducted group or individual video education with verbal and written material and guidebook.  Patient learns eating strategies to improve overall health, including an approach to cook more at home. Recommendations include thinking of animal protein as a side on your plate rather  than center stage and focusing instead on lower calorie dense options like vegetables, fruits, whole grains, and plant-based proteins, such as beans. Making sauces in large quantities to freeze for later and leaving the skin on your vegetables are also recommended to maximize your experience.  Cooking - Healthy Salads and Dressing Clinical staff conducted group or individual video education with verbal and written material and guidebook.  Patient learns that vegetables, fruits, whole grains, and legumes are the foundations of the Pritikin Eating Plan. Recommendations include how to incorporate each of these in flavorful and healthy salads, and how to create homemade salad dressings. Proper handling of ingredients is also covered. Cooking - Soups and [employer] - Soups and Desserts Clinical staff conducted group or individual video education with verbal and written material and guidebook.  Patient learns that Pritikin soups and desserts make for easy, nutritious, and delicious snacks and meal components that are low in sodium, fat, sugar, and calorie density, while high in vitamins, minerals, and filling fiber. Recommendations include simple and healthy ideas for soups and desserts.   Overview     The Pritikin Solution Program Overview Clinical staff conducted group or individual video education with verbal and written material and guidebook.  Patient learns that the results of the Pritikin Program have been documented in more than 100  articles published in peer-reviewed journals, and the benefits include reducing risk factors for (and, in some cases, even reversing) high cholesterol, high blood pressure, type 2 diabetes, obesity, and more! An overview of the three key pillars of the Pritikin Program will be covered: eating well, doing regular exercise, and having a healthy mind-set.  WORKSHOPS  Exercise: Exercise Basics: Building Your Action Plan Clinical staff led group instruction and group discussion with PowerPoint presentation and patient guidebook. To enhance the learning environment the use of posters, models and videos may be added. At the conclusion of this workshop, patients will comprehend the difference between physical activity and exercise, as well as the benefits of incorporating both, into their routine. Patients will understand the FITT (Frequency, Intensity, Time, and Type) principle and how to use it to build an exercise action plan. In addition, safety concerns and other considerations for exercise and cardiac rehab will be addressed by the presenter. The purpose of this lesson is to promote a comprehensive and effective weekly exercise routine in order to improve patients' overall level of fitness.   Managing Heart Disease: Your Path to a Healthier Heart Clinical staff led group instruction and group discussion with PowerPoint presentation and patient guidebook. To enhance the learning environment the use of posters, models and videos may be added.At the conclusion of this workshop, patients will understand the anatomy and physiology of the heart. Additionally, they will understand how Pritikin's three pillars impact the risk factors, the progression, and the management of heart disease.  The purpose of this lesson is to provide a high-level overview of the heart, heart disease, and how the Pritikin lifestyle positively impacts risk factors.  Exercise Biomechanics Clinical staff led group instruction and  group discussion with PowerPoint presentation and patient guidebook. To enhance the learning environment the use of posters, models and videos may be added. Patients will learn how the structural parts of their bodies function and how these functions impact their daily activities, movement, and exercise. Patients will learn how to promote a neutral spine, learn how to manage pain, and identify ways to improve their physical movement in order to promote healthy living. The purpose of  this lesson is to expose patients to common physical limitations that impact physical activity. Participants will learn practical ways to adapt and manage aches and pains, and to minimize their effect on regular exercise. Patients will learn how to maintain good posture while sitting, walking, and lifting.  Balance Training and Fall Prevention  Clinical staff led group instruction and group discussion with PowerPoint presentation and patient guidebook. To enhance the learning environment the use of posters, models and videos may be added. At the conclusion of this workshop, patients will understand the importance of their sensorimotor skills (vision, proprioception, and the vestibular system) in maintaining their ability to balance as they age. Patients will apply a variety of balancing exercises that are appropriate for their current level of function. Patients will understand the common causes for poor balance, possible solutions to these problems, and ways to modify their physical environment in order to minimize their fall risk. The purpose of this lesson is to teach patients about the importance of maintaining balance as they age and ways to minimize their risk of falling.  WORKSHOPS   Nutrition:  Fueling a Ship broker led group instruction and group discussion with PowerPoint presentation and patient guidebook. To enhance the learning environment the use of posters, models and videos may be  added. Patients will review the foundational principles of the Pritikin Eating Plan and understand what constitutes a serving size in each of the food groups. Patients will also learn Pritikin-friendly foods that are better choices when away from home and review make-ahead meal and snack options. Calorie density will be reviewed and applied to three nutrition priorities: weight maintenance, weight loss, and weight gain. The purpose of this lesson is to reinforce (in a group setting) the key concepts around what patients are recommended to eat and how to apply these guidelines when away from home by planning and selecting Pritikin-friendly options. Patients will understand how calorie density may be adjusted for different weight management goals.  Mindful Eating  Clinical staff led group instruction and group discussion with PowerPoint presentation and patient guidebook. To enhance the learning environment the use of posters, models and videos may be added. Patients will briefly review the concepts of the Pritikin Eating Plan and the importance of low-calorie dense foods. The concept of mindful eating will be introduced as well as the importance of paying attention to internal hunger signals. Triggers for non-hunger eating and techniques for dealing with triggers will be explored. The purpose of this lesson is to provide patients with the opportunity to review the basic principles of the Pritikin Eating Plan, discuss the value of eating mindfully and how to measure internal cues of hunger and fullness using the Hunger Scale. Patients will also discuss reasons for non-hunger eating and learn strategies to use for controlling emotional eating.  Targeting Your Nutrition Priorities Clinical staff led group instruction and group discussion with PowerPoint presentation and patient guidebook. To enhance the learning environment the use of posters, models and videos may be added. Patients will learn how to determine  their genetic susceptibility to disease by reviewing their family history. Patients will gain insight into the importance of diet as part of an overall healthy lifestyle in mitigating the impact of genetics and other environmental insults. The purpose of this lesson is to provide patients with the opportunity to assess their personal nutrition priorities by looking at their family history, their own health history and current risk factors. Patients will also be able to discuss ways of  prioritizing and modifying the Pritikin Eating Plan for their highest risk areas  Menu  Clinical staff led group instruction and group discussion with PowerPoint presentation and patient guidebook. To enhance the learning environment the use of posters, models and videos may be added. Using menus brought in from [employer], or printed from [employer], patients will apply the Pritikin dining out guidelines that were presented in the [employer] video. Patients will also be able to practice these guidelines in a variety of provided scenarios. The purpose of this lesson is to provide patients with the opportunity to practice hands-on learning of the Pritikin Dining Out guidelines with actual menus and practice scenarios.  Label Reading Clinical staff led group instruction and group discussion with PowerPoint presentation and patient guidebook. To enhance the learning environment the use of posters, models and videos may be added. Patients will review and discuss the Pritikin label reading guidelines presented in [employer]ikin's Label Reading Educational series video. Using fool labels brought in from local grocery stores and markets, patients will apply the label reading guidelines and determine if the packaged food meet the Pritikin guidelines. The purpose of this lesson is to provide patients with the opportunity to review, discuss, and practice hands-on learning of the Pritikin Label Reading guidelines  with actual packaged food labels. Cooking School  Pritikin's [employer] are designed to teach patients ways to prepare quick, simple, and affordable recipes at home. The importance of nutrition's role in chronic disease risk reduction is reflected in its emphasis in the overall Pritikin program. By learning how to prepare essential core Pritikin Eating Plan recipes, patients will increase control over what they eat; be able to customize the flavor of foods without the use of added salt, sugar, or fat; and improve the quality of the food they consume. By learning a set of core recipes which are easily assembled, quickly prepared, and affordable, patients are more likely to prepare more healthy foods at home. These workshops focus on convenient breakfasts, simple entres, side dishes, and desserts which can be prepared with minimal effort and are consistent with nutrition recommendations for cardiovascular risk reduction. Cooking [employer] are taught by a Armed forces logistics/support/administrative officer (RD) who has been trained by the [employer]. The chef or RD has a clear understanding of the importance of minimizing - if not completely eliminating - added fat, sugar, and sodium in recipes. Throughout the series of Cooking School Workshop sessions, patients will learn about healthy ingredients and efficient methods of cooking to build confidence in their capability to prepare    Cooking School weekly topics:  Adding Flavor- Sodium-Free  Fast and Healthy Breakfasts  Powerhouse Plant-Based Proteins  Satisfying Salads and Dressings  Simple Sides and Sauces  International Cuisine-Spotlight on the [employer] Zones  Delicious Desserts  Savory Soups  [employer] - Meals in a Astronomer Appetizers and Snacks  Comforting Weekend Breakfasts  One-Pot Wonders   Fast Evening Meals  Landscape architect Your Pritikin Plate  WORKSHOPS   Healthy Mindset  (Psychosocial):  Focused Goals, Sustainable Changes Clinical staff led group instruction and group discussion with PowerPoint presentation and patient guidebook. To enhance the learning environment the use of posters, models and videos may be added. Patients will be able to apply effective goal setting strategies to establish at least one personal goal, and then take consistent, meaningful action toward that goal. They will learn to identify common barriers to achieving personal goals and develop  strategies to overcome them. Patients will also gain an understanding of how our mind-set can impact our ability to achieve goals and the importance of cultivating a positive and growth-oriented mind-set. The purpose of this lesson is to provide patients with a deeper understanding of how to set and achieve personal goals, as well as the tools and strategies needed to overcome common obstacles which may arise along the way.  From Head to Heart: The Power of a Healthy Outlook  Clinical staff led group instruction and group discussion with PowerPoint presentation and patient guidebook. To enhance the learning environment the use of posters, models and videos may be added. Patients will be able to recognize and describe the impact of emotions and mood on physical health. They will discover the importance of self-care and explore self-care practices which may work for them. Patients will also learn how to utilize the 4 C's to cultivate a healthier outlook and better manage stress and challenges. The purpose of this lesson is to demonstrate to patients how a healthy outlook is an essential part of maintaining good health, especially as they continue their cardiac rehab journey.  Healthy Sleep for a Healthy Heart Clinical staff led group instruction and group discussion with PowerPoint presentation and patient guidebook. To enhance the learning environment the use of posters, models and videos may be added. At the  conclusion of this workshop, patients will be able to demonstrate knowledge of the importance of sleep to overall health, well-being, and quality of life. They will understand the symptoms of, and treatments for, common sleep disorders. Patients will also be able to identify daytime and nighttime behaviors which impact sleep, and they will be able to apply these tools to help manage sleep-related challenges. The purpose of this lesson is to provide patients with a general overview of sleep and outline the importance of quality sleep. Patients will learn about a few of the most common sleep disorders. Patients will also be introduced to the concept of "sleep hygiene," and discover ways to self-manage certain sleeping problems through simple daily behavior changes. Finally, the workshop will motivate patients by clarifying the links between quality sleep and their goals of heart-healthy living.   Recognizing and Reducing Stress Clinical staff led group instruction and group discussion with PowerPoint presentation and patient guidebook. To enhance the learning environment the use of posters, models and videos may be added. At the conclusion of this workshop, patients will be able to understand the types of stress reactions, differentiate between acute and chronic stress, and recognize the impact that chronic stress has on their health. They will also be able to apply different coping mechanisms, such as reframing negative self-talk. Patients will have the opportunity to practice a variety of stress management techniques, such as deep abdominal breathing, progressive muscle relaxation, and/or guided imagery.  The purpose of this lesson is to educate patients on the role of stress in their lives and to provide healthy techniques for coping with it.  Learning Barriers/Preferences:  Learning Barriers/Preferences - 11/29/22 1227       Learning Barriers/Preferences   Learning Barriers Sight;Hearing   wears glasses/  hearing loss left ear   Learning Preferences Skilled Demonstration;Individual Instruction;Computer/Internet;Video;Pictoral             Education Topics:  Knowledge Questionnaire Score:  Knowledge Questionnaire Score - 11/29/22 1352       Knowledge Questionnaire Score   Pre Score 20/24             Core  Components/Risk Factors/Patient Goals at Admission:  Personal Goals and Risk Factors at Admission - 11/29/22 1234       Core Components/Risk Factors/Patient Goals on Admission   Hypertension Yes    Intervention Provide education on lifestyle modifcations including regular physical activity/exercise, weight management, moderate sodium restriction and increased consumption of fresh fruit, vegetables, and low fat dairy, alcohol moderation, and smoking cessation.;Monitor prescription use compliance.    Expected Outcomes Short Term: Continued assessment and intervention until BP is < 140/84mm HG in hypertensive participants. < 130/85mm HG in hypertensive participants with diabetes, heart failure or chronic kidney disease.;Long Term: Maintenance of blood pressure at goal levels.    Lipids Yes    Intervention Provide education and support for participant on nutrition & aerobic/resistive exercise along with prescribed medications to achieve LDL 70mg , HDL >40mg .    Expected Outcomes Short Term: Participant states understanding of desired cholesterol values and is compliant with medications prescribed. Participant is following exercise prescription and nutrition guidelines.;Long Term: Cholesterol controlled with medications as prescribed, with individualized exercise RX and with personalized nutrition plan. Value goals: LDL < 70mg , HDL > 40 mg.             Core Components/Risk Factors/Patient Goals Review:    Core Components/Risk Factors/Patient Goals at Discharge (Final Review):    ITP Comments:  ITP Comments     Row Name 11/29/22 1045 12/05/22 1631         ITP Comments  Armanda Magic, MD:  Medical Director. Introduction to the [facility] / Intensive Cardiac Rehab.  Initial orientation packet reviewed with the patient. 30 Day ITP Review. Algernon Huxley started cardiac rehab on 12/05/22 and did well with exercise               Comments: See ITP comments.

## 2023-01-02 ENCOUNTER — Encounter (HOSPITAL_COMMUNITY)
Admission: RE | Admit: 2023-01-02 | Discharge: 2023-01-02 | Disposition: A | Discharge status: Home / Self Care | Payer: Medicare Other | Source: Ambulatory Visit | Attending: Cardiology | Admitting: Cardiology

## 2023-01-02 DIAGNOSIS — Z952 Presence of prosthetic heart valve: Secondary | ICD-10-CM

## 2023-01-02 DIAGNOSIS — Z951 Presence of aortocoronary bypass graft: Secondary | ICD-10-CM | POA: Diagnosis not present

## 2023-01-02 NOTE — Progress Notes (Signed)
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 Discontinue once prescription runs out (Patient not taking: Reported on 11/29/2022), Disp: , Rfl:    aspirin EC 81 MG tablet, Take 1 tablet (81 mg total) by mouth daily. Swallow whole., Disp: 150 tablet, Rfl: 2   buPROPion (WELLBUTRIN XL) 150 MG 24 hr tablet, Take 300 mg by mouth in the morning., Disp: , Rfl:    cetirizine (ZYRTEC) 10 MG tablet, Take 10 mg by mouth at bedtime., Disp: , Rfl:    colchicine 0.6 MG tablet, Take 0.6 mg by mouth every 6 (six) hours as needed (gout flares). , Disp: , Rfl:    famotidine (PEPCID) 20 MG tablet, Take 20 mg by mouth at bedtime., Disp: , Rfl:    Fe Fum-Vit C-Vit B12-FA (TRIGELS-F FORTE) CAPS capsule, Take 1 capsule by mouth daily after breakfast. May substitute with similar generic medication (Patient not taking: Reported on 11/29/2022), Disp: 30 capsule, Rfl: 0   fluticasone (FLONASE) 50 MCG/ACT nasal spray, Place 1-2 sprays into both nostrils at bedtime as needed for allergies., Disp: , Rfl:    gabapentin (NEURONTIN) 300 MG capsule, Take 600 mg by  mouth 3 (three) times daily., Disp: , Rfl:    Glucosamine-Chondroitin (COSAMIN DS PO), Take 1 tablet by mouth 2 (two) times daily. Triple Strength, Disp: , Rfl:    Multiple Vitamin (MULTIVITAMIN WITH MINERALS) TABS tablet, Take 1 tablet by mouth daily. Complete Multivitamin, Disp: , Rfl:    Probiotic Product (ALIGN) 4 MG CAPS, Take 4 mg by mouth daily as needed (gut health)., Disp: , Rfl:    rosuvastatin (CRESTOR) 10 MG tablet, Take 10 mg by mouth in the morning., Disp: , Rfl:    SALINE NASAL MIST NA, Place 1 spray into the nose 3 (three) times daily as needed (congestion)., Disp: , Rfl:    tamsulosin (FLOMAX) 0.4 MG CAPS capsule, Take 0.4 mg by mouth every evening., Disp: , Rfl:    TURMERIC PO, Take 1,000 mg by mouth in the morning and at bedtime., Disp: , Rfl:   Past Medical History: Past Medical History:  Diagnosis Date   Aortic regurgitation 01/07/2019   severe and eccentric by TEE [id number] with RF 69%.   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    Tobacco Use: Social History   Tobacco Use  Smoking Status Former   Current packs/day: 0.00   Types: Cigarettes   Quit date: 07/12/1980   Years since quitting: 42.5  Smokeless Tobacco Never    Labs: Review Flowsheet  More data exists      Latest Ref Rng & Units 09/28/2022 10/16/2022 10/18/2022 10/19/2022 11/02/2022  Labs for ITP Cardiac and Pulmonary Rehab  Cholestrol 100 - 199 mg/dL - - - - 90   LDL (calc) 0 - 99 mg/dL - - - - 32   HDL-C >21 mg/dL - - - - 37   Trlycerides 0 - 149 mg/dL - - - - 308   Hemoglobin A1c 4.8 -  5.6 % - 5.5  - - -  PH, Arterial 7.35 - 7.45 7.326  7.4  7.411  7.396  7.335  7.371  7.365  7.390  -  PCO2 arterial 32 - 48 mmHg 46.3  35  32.8  35.9  41.7  42.2  40.2  38.7  -  Bicarbonate 20.0 - 28.0 mmol/L 23.1  24.4  24.2  21.7  21.2  22.0  22.2  24.4  22.9  23.2  -  TCO2 22 - 32 mmol/L 24  26  26   - 22  22  23  24  24  23  26  26  27  24  24   -  Acid-base deficit 0.0 - 2.0 mmol/L 3.0  2.0  2.0  2.5  3.0  3.0  3.0  1.0  2.0  1.0  -  O2 Saturation % 67  69  93  98  97  99  100  100  99  99  -    Details       Multiple values from one day are sorted in reverse-chronological order         Capillary Blood Glucose: Lab Results  Component Value Date   GLUCAP 106 (H) 10/23/2022   GLUCAP 124 (H) 10/22/2022   GLUCAP 112 (H) 10/21/2022   GLUCAP 114 (H) 10/21/2022   GLUCAP 133 (H) 10/21/2022     Exercise Target Goals: Exercise Program Goal: Individual exercise prescription set using results from initial 6 min walk test and THRR while considering  patient's activity barriers and safety.   Exercise Prescription Goal: Initial exercise prescription builds to 30-45 minutes a day of aerobic activity, 2-3 days per week.  Home exercise guidelines will be given to patient during program as part of exercise prescription that the participant will acknowledge.  Activity Barriers & Risk Stratification:  Activity Barriers & Cardiac Risk Stratification - 11/29/22 1224       Activity Barriers & Cardiac Risk Stratification   Activity Barriers Arthritis;Back Problems;Right Knee Replacement;Joint Problems;Deconditioning;Muscular Weakness;Balance Concerns;Other (comment)    Comments Sternal Precautions    Cardiac Risk Stratification High             6 Minute Walk:  6 Minute Walk     Row Name 11/29/22 1142         6 Minute Walk   Phase Initial  Used Go-cart     Distance 1233 feet     Walk Time 6 minutes     # of Rest Breaks 0     MPH 2.34     METS 2.45     RPE 10  Perceived  Dyspnea  0     VO2 Peak 8.6     Symptoms No     Resting HR 75 bpm     Resting BP 116/80     Resting Oxygen Saturation  98 %     Exercise Oxygen Saturation  during 6 min walk 98 %     Max Ex. HR 90 bpm     Max Ex. BP 110/70     2 Minute Post BP 120/80              Oxygen Initial Assessment:   Oxygen Re-Evaluation:   Oxygen Discharge (Final Oxygen Re-Evaluation):   Initial Exercise Prescription:  Initial Exercise Prescription - 11/29/22 1200       Date of Initial Exercise RX and Referring Provider   Date 11/29/22    Referring Provider Armanda Magic, MD    Expected Discharge Date 02/08/23      NuStep   Level 1    SPM 80    Minutes 15    METs 2.45      Arm Ergometer   Level 1    Watts 25    RPM 60    Minutes 15    METs 2.45      Prescription Details   Frequency (times per week) 3    Duration Progress to 30 minutes of continuous aerobic without signs/symptoms of physical distress      Intensity   THRR 40-80% of Max Heartrate 58-116    Ratings of Perceived Exertion 11-13    Perceived Dyspnea 0-4      Progression   Progression Continue progressive overload as per policy without signs/symptoms or physical distress.      Resistance Training   Training Prescription Yes    Weight 3 lbs    Reps 10-15             Perform Capillary Blood Glucose checks as needed.  Exercise Prescription Changes:   Exercise Prescription Changes     Row Name 12/05/22 1400 12/24/22 1500 12/26/22 1000         Response to Exercise   Blood Pressure (Admit) 114/80 120/68 --     Blood Pressure (Exercise) 120/80 146/84 --     Blood Pressure (Exit) 102/68 122/64 --     Heart Rate (Admit) 69 bpm 79 bpm --     Heart Rate (Exercise) 96 bpm 108 bpm --     Heart Rate (Exit) 78 bpm 79 bpm --     Rating of Perceived Exertion (Exercise) 11.5 11 --     Symptoms None None --     Comments Pt's first day in the CRP2 program Reviewed METs --     Duration Continue with 30 min of  aerobic exercise without signs/symptoms of physical distress. Continue with 30 min of aerobic exercise without signs/symptoms of physical distress. --     Intensity THRR unchanged THRR unchanged --       Progression   Progression Continue to progress workloads to maintain intensity without signs/symptoms of physical distress. Continue to progress workloads to maintain intensity without signs/symptoms of physical distress. --     Average METs 1.85 2.2 --       Resistance Training   Training Prescription No Yes --     Weight No weights on Wednesdays 3 lbs --     Reps -- 10-15 --     Time -- 10 Minutes --       Interval Training  Interval Training No No --       NuStep   Level 1 1 --     SPM -- 98 --     Minutes 15 15 --     METs 2.2 2.7 --       Arm Ergometer   Level 1 1 --     Watts 9 11 --     RPM 39 -- --     Minutes 15 15 --     METs 1.5 1.7 --              Exercise Comments:   Exercise Comments     Row Name 12/05/22 1422 12/24/22 1500 12/26/22 1059       Exercise Comments Pt's first day in the CRP2 program. Pt exercised without complaints. Reviewed METs. Pt is progressing. --              Exercise Goals and Review:   Exercise Goals     Row Name 11/29/22 1214             Exercise Goals   Increase Physical Activity Yes       Intervention Provide advice, education, support and counseling about physical activity/exercise needs.;Develop an individualized exercise prescription for aerobic and resistive training based on initial evaluation findings, risk stratification, comorbidities and participant's personal goals.       Expected Outcomes Short Term: Attend rehab on a regular basis to increase amount of physical activity.;Long Term: Add in home exercise to make exercise part of routine and to increase amount of physical activity.;Long Term: Exercising regularly at least 3-5 days a week.       Increase Strength and Stamina Yes       Intervention Provide  advice, education, support and counseling about physical activity/exercise needs.;Develop an individualized exercise prescription for aerobic and resistive training based on initial evaluation findings, risk stratification, comorbidities and participant's personal goals.       Expected Outcomes Short Term: Increase workloads from initial exercise prescription for resistance, speed, and METs.;Short Term: Perform resistance training exercises routinely during rehab and add in resistance training at home;Long Term: Improve cardiorespiratory fitness, muscular endurance and strength as measured by increased METs and functional capacity ( )       Able to understand and use rate of perceived exertion (RPE) scale Yes       Intervention Provide education and explanation on how to use RPE scale       Expected Outcomes Short Term: Able to use RPE daily in rehab to express subjective intensity level;Long Term:  Able to use RPE to guide intensity level when exercising independently       Knowledge and understanding of Target Heart Rate Range (THRR) Yes       Intervention Provide education and explanation of THRR including how the numbers were predicted and where they are located for reference       Expected Outcomes Short Term: Able to state/look up THRR;Short Term: Able to use daily as guideline for intensity in rehab;Long Term: Able to use THRR to govern intensity when exercising independently       Understanding of Exercise Prescription Yes       Intervention Provide education, explanation, and written materials on patient's individual exercise prescription       Expected Outcomes Short Term: Able to explain program exercise prescription;Long Term: Able to explain home exercise prescription to exercise independently  Exercise Goals Re-Evaluation :  Exercise Goals Re-Evaluation     Row Name 12/05/22 1420             Exercise Goal Re-Evaluation   Exercise Goals Review Increase  Physical Activity;Understanding of Exercise Prescription;Increase Strength and Stamina;Knowledge and understanding of Target Heart Rate Range (THRR);Able to understand and use rate of perceived exertion (RPE) scale       Comments Pt's first day in the CRP2 program. Pt understands the exercise Rx, REP scale and THRR.       Expected Outcomes Will continue to monitor patient and progress exercise workloads as tolerated.                Discharge Exercise Prescription (Final Exercise Prescription Changes):  Exercise Prescription Changes - 12/26/22 1000       Response to Exercise   Blood Pressure (Admit) --    Blood Pressure (Exercise) --    Blood Pressure (Exit) --    Heart Rate (Admit) --    Heart Rate (Exercise) --    Heart Rate (Exit) --    Rating of Perceived Exertion (Exercise) --    Symptoms --    Comments --    Duration --    Intensity --      Progression   Progression --    Average METs --      Resistance Training   Training Prescription --    Weight --    Reps --    Time --      Interval Training   Interval Training --      NuStep   Level --    SPM --    Minutes --    METs --      Arm Ergometer   Level --    Watts --    Minutes --    METs --             Nutrition:  Target Goals: Understanding of nutrition guidelines, daily intake of sodium 1500mg , cholesterol 200mg , calories 30% from fat and 7% or less from saturated fats, daily to have 5 or more servings of fruits and vegetables.  Biometrics:  Pre Biometrics - 11/29/22 1055       Pre Biometrics   Waist Circumference 40.5 inches    Hip Circumference 40 inches    Waist to Hip Ratio 1.01 %    Triceps Skinfold 12 mm    Grip Strength 42 kg    Flexibility 0 in   could not reach   Single Leg Stand 2 seconds             Post Biometrics - 11/29/22 1055        Post  Biometrics   % Body Fat 26.2 %             Nutrition Therapy Plan and Nutrition Goals:  Nutrition Therapy & Goals  - 12/24/22 0941       Nutrition Therapy   Diet Heart Healthy Diet    Drug/Food Interactions Statins/Certain Fruits      Personal Nutrition Goals   Nutrition Goal Patient to identify strategies for reducing cardiovascular risk by attending the [employer] education and nutrition series weekly.   goal in action.   Personal Goal #2 Patient to improve diet quality by using the plate method as a guide for meal planning to include lean protein/plant protein, fruits, vegetables, whole grains, nonfat dairy as part of a well-balanced diet.   goal in action.  Personal Goal #3 Patient to reduce sodium to 1500mg  per day   goal in action.   Personal Goal #4 Patient to identify strategies for weight maintenance and weight gain of 0.5-2.0# as needed.   goal in progress.   Comments Goals in action. Theodore Patton and his wife continue to attend the [employer] education and nutrition series regularly. They have started making many dietary changes including implemented many Pritikin recipes, increased dietary fiber, and reduced saturated fat intake. Theodore Patton does report history of weight loss since shoulder surgery 03/2022 and cardiac surgery in 10/2022; his weight prior to first surgery was 196# per documentation on 02/12/2022. He is motivated to maintain his current weight and possibly gain a little weight back. We discussed strategies for weight maintenance including increasing portion sizes of fat/protein/carbohydrates and increasing eating frequency. Patient will benefit from participation in intensive cardiac rehab for nutrition, exercise, and lifestyle modification.      Intervention Plan   Intervention Prescribe, educate and counsel regarding individualized specific dietary modifications aiming towards targeted core components such as weight, hypertension, lipid management, diabetes, heart failure and other comorbidities.;Nutrition handout(s) given to patient.    Expected Outcomes Short Term Goal: Understand basic principles of  dietary content, such as calories, fat, sodium, cholesterol and nutrients.;Long Term Goal: Adherence to prescribed nutrition plan.             Nutrition Assessments:  Nutrition Assessments - 12/25/22 0937       Rate Your Plate Scores   Pre Score 58            MEDIFICTS Score Key: ?70 Need to make dietary changes  40-70 Heart Healthy Diet ? 40 Therapeutic Level Cholesterol Diet   Flowsheet Row INTENSIVE CARDIAC REHAB from 12/24/2022 in [facility]  Picture Your Plate Total Score on Admission 58      Picture Your Plate Scores: <47 Unhealthy dietary pattern with much room for improvement. 41-50 Dietary pattern unlikely to meet recommendations for good health and room for improvement. 51-60 More healthful dietary pattern, with some room for improvement.  >60 Healthy dietary pattern, although there may be some specific behaviors that could be improved.    Nutrition Goals Re-Evaluation:  Nutrition Goals Re-Evaluation     Row Name 12/05/22 1329 12/24/22 0941           Goals   Current Weight 190 lb 7.6 oz (86.4 kg) 187 lb 6.3 oz (85 kg)      Comment HDL 37, lipoprotein A WNL, hemoglobin 9.7, hematocrit 29.8 no new labs; most recent labs HDL 37, lipoprotein A WNL, hemoglobin 9.7, hematocrit 29.8      Expected Outcome Theodore Patton reports that he and his wife "eat the wrong things" but are receptive to making dietary/lifestyle changes. Patient will benefit from participation in intensive cardiac rehab for nutrition, exercise, and lifestyle modification. Goals in action. Theodore Patton and his wife continue to attend the [employer] education and nutrition series regularly. They have started making many dietary changes including implemented many Pritikin recipes, increased dietary fiber, and reduced saturated fat intake. Theodore Patton does report history of weight loss since shoulder surgery 03/2022 and cardiac surgery in 10/2022; his weight prior to  first surgery was 196# per documentation on 02/12/2022. He is motivated to maintain his current weight and possibly gain a little weight back. We discussed strategies for weight maintenance including increasing portion sizes of fat/protein/carbohydrates and increasing eating frequency. Patient will benefit from participation in intensive cardiac  rehab for nutrition, exercise, and lifestyle modification.               Nutrition Goals Re-Evaluation:  Nutrition Goals Re-Evaluation     Row Name 12/05/22 1329 12/24/22 0941           Goals   Current Weight 190 lb 7.6 oz (86.4 kg) 187 lb 6.3 oz (85 kg)      Comment HDL 37, lipoprotein A WNL, hemoglobin 9.7, hematocrit 29.8 no new labs; most recent labs HDL 37, lipoprotein A WNL, hemoglobin 9.7, hematocrit 29.8      Expected Outcome Theodore Patton reports that he and his wife "eat the wrong things" but are receptive to making dietary/lifestyle changes. Patient will benefit from participation in intensive cardiac rehab for nutrition, exercise, and lifestyle modification. Goals in action. Theodore Patton and his wife continue to attend the [employer] education and nutrition series regularly. They have started making many dietary changes including implemented many Pritikin recipes, increased dietary fiber, and reduced saturated fat intake. Theodore Patton does report history of weight loss since shoulder surgery 03/2022 and cardiac surgery in 10/2022; his weight prior to first surgery was 196# per documentation on 02/12/2022. He is motivated to maintain his current weight and possibly gain a little weight back. We discussed strategies for weight maintenance including increasing portion sizes of fat/protein/carbohydrates and increasing eating frequency. Patient will benefit from participation in intensive cardiac rehab for nutrition, exercise, and lifestyle modification.               Nutrition Goals Discharge (Final Nutrition Goals Re-Evaluation):  Nutrition Goals Re-Evaluation -  12/24/22 0941       Goals   Current Weight 187 lb 6.3 oz (85 kg)    Comment no new labs; most recent labs HDL 37, lipoprotein A WNL, hemoglobin 9.7, hematocrit 29.8    Expected Outcome Goals in action. Theodore Patton and his wife continue to attend the [employer] education and nutrition series regularly. They have started making many dietary changes including implemented many Pritikin recipes, increased dietary fiber, and reduced saturated fat intake. Theodore Patton does report history of weight loss since shoulder surgery 03/2022 and cardiac surgery in 10/2022; his weight prior to first surgery was 196# per documentation on 02/12/2022. He is motivated to maintain his current weight and possibly gain a little weight back. We discussed strategies for weight maintenance including increasing portion sizes of fat/protein/carbohydrates and increasing eating frequency. Patient will benefit from participation in intensive cardiac rehab for nutrition, exercise, and lifestyle modification.             Psychosocial: Target Goals: Acknowledge presence or absence of significant depression and/or stress, maximize coping skills, provide positive support system. Participant is able to verbalize types and ability to use techniques and skills needed for reducing stress and depression.  Initial Review & Psychosocial Screening:  Initial Psych Review & Screening - 11/29/22 1225       Initial Review   Current issues with Current Sleep Concerns;History of Depression   Voices he is not currently depressed. Pt feels his Wellbutrin is working well for him.     Family Dynamics   Good Support System? Yes   Has spouse for support     Barriers   Psychosocial barriers to participate in program There are no identifiable barriers or psychosocial needs.      Screening Interventions   Interventions Encouraged to exercise             Quality of Life Scores:  Quality of Life -  11/29/22 1349       Quality of Life   Select Quality of  Life      Quality of Life Scores   Health/Function Pre 21 %    Socioeconomic Pre 22.14 %    Psych/Spiritual Pre 24 %    Family Pre 20.5 %    GLOBAL Pre 21.78 %            Scores of 19 and below usually indicate a poorer quality of life in these areas.  A difference of  2-3 points is a clinically meaningful difference.  A difference of 2-3 points in the total score of the Quality of Life Index has been associated with significant improvement in overall quality of life, self-image, physical symptoms, and general health in studies assessing change in quality of life.  PHQ-9: Review Flowsheet       11/29/2022  Depression screen PHQ 2/9  Decreased Interest 1  Down, Depressed, Hopeless 0  PHQ - 2 Score 1  Altered sleeping 1  Tired, decreased energy 1  Change in appetite 0  Feeling bad or failure about yourself  0  Trouble concentrating 2  Moving slowly or fidgety/restless 2  Suicidal thoughts 0  PHQ-9 Score 7  Difficult doing work/chores Somewhat difficult    Details           Interpretation of Total Score  Total Score Depression Severity:  1-4 = Minimal depression, 5-9 = Mild depression, 10-14 = Moderate depression, 15-19 = Moderately severe depression, 20-27 = Severe depression   Psychosocial Evaluation and Intervention:   Psychosocial Re-Evaluation:  Psychosocial Re-Evaluation     Row Name 01/01/23 1640             Psychosocial Re-Evaluation   Current issues with Current Stress Concerns;History of Depression       Comments Theodore Patton says he feels better and does not have any current concerns or stressors       Expected Outcomes Theodore Patton will have decreased or controlled stress and depressio upon completion of cardiac rehab       Interventions Stress management education;Encouraged to attend Cardiac Rehabilitation for the exercise;Relaxation education       Continue Psychosocial Services  Follow up required by staff         Initial Review   Source of Stress Concerns  Chronic Illness;Unable to participate in former interests or hobbies;Unable to perform yard/household activities       Comments Will continue to monitor and offer support as needed                Psychosocial Discharge (Final Psychosocial Re-Evaluation):  Psychosocial Re-Evaluation - 01/01/23 1640       Psychosocial Re-Evaluation   Current issues with Current Stress Concerns;History of Depression    Comments Theodore Patton says he feels better and does not have any current concerns or stressors    Expected Outcomes Theodore Patton will have decreased or controlled stress and depressio upon completion of cardiac rehab    Interventions Stress management education;Encouraged to attend Cardiac Rehabilitation for the exercise;Relaxation education    Continue Psychosocial Services  Follow up required by staff      Initial Review   Source of Stress Concerns Chronic Illness;Unable to participate in former interests or hobbies;Unable to perform yard/household activities    Comments Will continue to monitor and offer support as needed             Vocational Rehabilitation: Provide vocational rehab assistance to qualifying candidates.  Vocational Rehab Evaluation & Intervention:  Vocational Rehab - 11/29/22 1234       Initial Vocational Rehab Evaluation & Intervention   Assessment shows need for Vocational Rehabilitation No   Pt is retired            Education: Education Goals: Education classes will be provided on a weekly basis, covering required topics. Participant will state understanding/return demonstration of topics presented.    Education     Row Name 12/05/22 1600     Education   Cardiac Education Topics Pritikin   Environmental education officer - Meals in a Snap   Instruction Review Code 1- Verbalizes Understanding   Class Start Time 1400   Class Stop Time 1445   Class Time Calculation (min) 45 min    Row  Name 12/07/22 1400     Education   Cardiac Education Topics Pritikin   Psychologist, forensic Exercise Education   Exercise Education Move It!   Instruction Review Code 1- Verbalizes Understanding   Class Start Time 1400   Class Stop Time 1445   Class Time Calculation (min) 45 min    Row Name 12/10/22 1400     Education   Cardiac Education Topics Pritikin   Immunologist Exercise Physiologist   Select Psychosocial   Psychosocial Workshop Focused Goals, Sustainable Changes   Instruction Review Code 1- Verbalizes Understanding   Class Start Time 1355   Class Stop Time 1437   Class Time Calculation (min) 42 min    Row Name 12/12/22 1600     Education   Cardiac Education Topics Pritikin   Customer service manager   Weekly Topic One-Pot Wonders   Instruction Review Code 1- Verbalizes Understanding   Class Start Time 1400   Class Stop Time 1450   Class Time Calculation (min) 50 min    Row Name 12/14/22 1500     Education   Cardiac Education Topics Pritikin   Hospital doctor Education   General Education Hypertension and Heart Disease   Instruction Review Code 1- Verbalizes Understanding   Class Start Time 1400   Class Stop Time 1450   Class Time Calculation (min) 50 min    Row Name 12/19/22 1600     Education   Cardiac Education Topics Pritikin   Customer service manager   Weekly Topic Comforting Weekend Breakfasts   Instruction Review Code 1- Verbalizes Understanding   Class Start Time 1400   Class Stop Time 1445   Class Time Calculation (min) 45 min    Row Name 12/24/22 1500     Education   Cardiac Education Topics Pritikin   Nurse, children's   Educator Dietitian   Select Nutrition   Nutrition Facts  on Fat   Instruction Review Code 1- Verbalizes Understanding   Class Start Time 1400   Class Stop Time 1440   Class Time Calculation (min) 40 min    Row Name 12/26/22 1500     Education   Cardiac Education Topics Pritikin   [employer]  Aeronautical engineer Fast Evening Meals   Instruction Review Code 1- Verbalizes Understanding   Class Start Time 1400   Class Stop Time 1440   Class Time Calculation (min) 40 min    Row Name 12/28/22 1500     Education   Cardiac Education Topics Pritikin   Licensed conveyancer Nutrition   Nutrition Vitamins and Minerals   Instruction Review Code 1- Verbalizes Understanding   Class Start Time 1400   Class Stop Time 1445   Class Time Calculation (min) 45 min    Row Name 12/31/22 1600     Education   Cardiac Education Topics Pritikin   Geographical information systems officer Psychosocial   Psychosocial Workshop Healthy Sleep for a Healthy Heart   Instruction Review Code 1- Verbalizes Understanding   Class Start Time 1400   Class Stop Time 1455   Class Time Calculation (min) 55 min            Core Videos: Exercise    Move It!  Clinical staff conducted group or individual video education with verbal and written material and guidebook.  Patient learns the recommended Pritikin exercise program. Exercise with the goal of living a long, healthy life. Some of the health benefits of exercise include controlled diabetes, healthier blood pressure levels, improved cholesterol levels, improved heart and lung capacity, improved sleep, and better body composition. Everyone should speak with their doctor before starting or changing an exercise routine.  Biomechanical Limitations Clinical staff conducted group or individual video education with verbal and written material and guidebook.  Patient learns how biomechanical  limitations can impact exercise and how we can mitigate and possibly overcome limitations to have an impactful and balanced exercise routine.  Body Composition Clinical staff conducted group or individual video education with verbal and written material and guidebook.  Patient learns that body composition (ratio of muscle mass to fat mass) is a key component to assessing overall fitness, rather than body weight alone. Increased fat mass, especially visceral belly fat, can put [place] at increased risk for metabolic syndrome, type 2 diabetes, heart disease, and even death. It is recommended to combine diet and exercise (cardiovascular and resistance training) to improve your body composition. Seek guidance from your physician and exercise physiologist before implementing an exercise routine.  Exercise Action Plan Clinical staff conducted group or individual video education with verbal and written material and guidebook.  Patient learns the recommended strategies to achieve and enjoy long-term exercise adherence, including variety, self-motivation, self-efficacy, and positive decision making. Benefits of exercise include fitness, good health, weight management, more energy, better sleep, less stress, and overall well-being.  Medical   Heart Disease Risk Reduction Clinical staff conducted group or individual video education with verbal and written material and guidebook.  Patient learns our heart is our most vital organ as it circulates oxygen, nutrients, white blood cells, and hormones throughout the entire body, and carries waste away. Data supports a plant-based eating plan like the Pritikin Program for its effectiveness in slowing progression of and reversing heart disease. The video provides a number of recommendations to address heart disease.   Metabolic Syndrome and Belly Fat  Clinical staff conducted group or individual video education with verbal and written material and guidebook.  Patient learns  what metabolic syndrome is, how it leads to heart  disease, and how one can reverse it and keep it from coming back. Theodore have metabolic syndrome if Theodore have 3 of the following 5 criteria: abdominal obesity, high blood pressure, high triglycerides, low HDL cholesterol, and high blood sugar.  Hypertension and Heart Disease Clinical staff conducted group or individual video education with verbal and written material and guidebook.  Patient learns that high blood pressure, or hypertension, is very common in the [place]. Hypertension is largely due to excessive salt intake, but other important risk factors include being overweight, physical inactivity, drinking too much alcohol, smoking, and not eating enough potassium from fruits and vegetables. High blood pressure is a leading risk factor for heart attack, stroke, congestive heart failure, dementia, kidney failure, and premature death. Long-term effects of excessive salt intake include stiffening of the arteries and thickening of heart muscle and organ damage. Recommendations include ways to reduce hypertension and the risk of heart disease.  Diseases of Our Time - Focusing on Diabetes Clinical staff conducted group or individual video education with verbal and written material and guidebook.  Patient learns why the best way to stop diseases of our time is prevention, through food and other lifestyle changes. Medicine (such as prescription pills and surgeries) is often only a Band-Aid on the problem, not a long-term solution. Most common diseases of our time include obesity, type 2 diabetes, hypertension, heart disease, and cancer. The Pritikin Program is recommended and has been proven to help reduce, reverse, and/or prevent the damaging effects of metabolic syndrome.  Nutrition   Overview of the Pritikin Eating Plan  Clinical staff conducted group or individual video education with verbal and written material and guidebook.  Patient learns about the  Pritikin Eating Plan for disease risk reduction. The Pritikin Eating Plan emphasizes a wide variety of unrefined, minimally-processed carbohydrates, like fruits, vegetables, whole grains, and legumes. Go, Caution, and Stop food choices are explained. Plant-based and lean animal proteins are emphasized. Rationale provided for low sodium intake for blood pressure control, low added sugars for blood sugar stabilization, and low added fats and oils for coronary artery disease risk reduction and weight management.  Calorie Density  Clinical staff conducted group or individual video education with verbal and written material and guidebook.  Patient learns about calorie density and how it impacts the Pritikin Eating Plan. Knowing the characteristics of the food Theodore choose will help Theodore decide whether those foods will lead to weight gain or weight loss, and whether Theodore want to consume more or less of them. Weight loss is usually a side effect of the Pritikin Eating Plan because of its focus on low calorie-dense foods.  Label Reading  Clinical staff conducted group or individual video education with verbal and written material and guidebook.  Patient learns about the Pritikin recommended label reading guidelines and corresponding recommendations regarding calorie density, added sugars, sodium content, and whole grains.  Dining Out - Part 1  Clinical staff conducted group or individual video education with verbal and written material and guidebook.  Patient learns that restaurant meals can be sabotaging because they can be so high in calories, fat, sodium, and/or sugar. Patient learns recommended strategies on how to positively address this and avoid unhealthy pitfalls.  Facts on Fats  Clinical staff conducted group or individual video education with verbal and written material and guidebook.  Patient learns that lifestyle modifications can be just as effective, if not more so, as many medications for lowering  your risk of heart disease. A Pritikin  lifestyle can help to reduce your risk of inflammation and atherosclerosis (cholesterol build-up, or plaque, in the artery walls). Lifestyle interventions such as dietary choices and physical activity address the cause of atherosclerosis. A review of the types of fats and their impact on blood cholesterol levels, along with dietary recommendations to reduce fat intake is also included.  Nutrition Action Plan  Clinical staff conducted group or individual video education with verbal and written material and guidebook.  Patient learns how to incorporate Pritikin recommendations into their lifestyle. Recommendations include planning and keeping personal health goals in mind as an important part of their success.  Healthy Mind-Set    Healthy Minds, Bodies, Hearts  Clinical staff conducted group or individual video education with verbal and written material and guidebook.  Patient learns how to identify when they are stressed. Video will discuss the impact of that stress, as well as the many benefits of stress management. Patient will also be introduced to stress management techniques. The way we think, act, and feel has an impact on our hearts.  How Our Thoughts Can Heal Our Hearts  Clinical staff conducted group or individual video education with verbal and written material and guidebook.  Patient learns that negative thoughts can cause depression and anxiety. This can result in negative lifestyle behavior and serious health problems. Cognitive behavioral therapy is an effective method to help control our thoughts in order to change and improve our emotional outlook.  Additional Videos:  Exercise    Improving Performance  Clinical staff conducted group or individual video education with verbal and written material and guidebook.  Patient learns to use a non-linear approach by alternating intensity levels and lengths of time spent exercising to help burn more  calories and lose more body fat. Cardiovascular exercise helps improve heart health, metabolism, hormonal balance, blood sugar control, and recovery from fatigue. Resistance training improves strength, endurance, balance, coordination, reaction time, metabolism, and muscle mass. Flexibility exercise improves circulation, posture, and balance. Seek guidance from your physician and exercise physiologist before implementing an exercise routine and learn your capabilities and proper form for all exercise.  Introduction to Yoga  Clinical staff conducted group or individual video education with verbal and written material and guidebook.  Patient learns about yoga, a discipline of the coming together of mind, breath, and body. The benefits of yoga include improved flexibility, improved range of motion, better posture and core strength, increased lung function, weight loss, and positive self-image. Yoga's heart health benefits include lowered blood pressure, healthier heart rate, decreased cholesterol and triglyceride levels, improved immune function, and reduced stress. Seek guidance from your physician and exercise physiologist before implementing an exercise routine and learn your capabilities and proper form for all exercise.  Medical   Aging: Enhancing Your Quality of Life  Clinical staff conducted group or individual video education with verbal and written material and guidebook.  Patient learns key strategies and recommendations to stay in good physical health and enhance quality of life, such as prevention strategies, having an advocate, securing a Health Care Proxy and Power of Attorney, and keeping a list of medications and system for tracking them. It also discusses how to avoid risk for bone loss.  Biology of Weight Control  Clinical staff conducted group or individual video education with verbal and written material and guidebook.  Patient learns that weight gain occurs because we consume more  calories than we burn (eating more, moving less). Even if your body weight is normal, Theodore may have higher  ratios of fat compared to muscle mass. Too much body fat puts Theodore at increased risk for cardiovascular disease, heart attack, stroke, type 2 diabetes, and obesity-related cancers. In addition to exercise, following the Pritikin Eating Plan can help reduce your risk.  Decoding Lab Results  Clinical staff conducted group or individual video education with verbal and written material and guidebook.  Patient learns that lab test reflects one measurement whose values change over time and are influenced by many factors, including medication, stress, sleep, exercise, food, hydration, pre-existing medical conditions, and more. It is recommended to use the knowledge from this video to become more involved with your lab results and evaluate your numbers to speak with your doctor.   Diseases of Our Time - Overview  Clinical staff conducted group or individual video education with verbal and written material and guidebook.  Patient learns that according to the CDC, 50% to 70% of chronic diseases (such as obesity, type 2 diabetes, elevated lipids, hypertension, and heart disease) are avoidable through lifestyle improvements including healthier food choices, listening to satiety cues, and increased physical activity.  Sleep Disorders Clinical staff conducted group or individual video education with verbal and written material and guidebook.  Patient learns how good quality and duration of sleep are important to overall health and well-being. Patient also learns about sleep disorders and how they impact health along with recommendations to address them, including discussing with a physician.  Nutrition  Dining Out - Part 2 Clinical staff conducted group or individual video education with verbal and written material and guidebook.  Patient learns how to plan ahead and communicate in order to maximize their  dining experience in a healthy and nutritious manner. Included are recommended food choices based on the type of restaurant the patient is visiting.   Fueling a Banker conducted group or individual video education with verbal and written material and guidebook.  There is a strong connection between our food choices and our health. Diseases like obesity and type 2 diabetes are very prevalent and are in large-part due to lifestyle choices. The Pritikin Eating Plan provides plenty of food and hunger-curbing satisfaction. It is easy to follow, affordable, and helps reduce health risks.  Menu Workshop  Clinical staff conducted group or individual video education with verbal and written material and guidebook.  Patient learns that restaurant meals can sabotage health goals because they are often packed with calories, fat, sodium, and sugar. Recommendations include strategies to plan ahead and to communicate with the manager, chef, or server to help order a healthier meal.  Planning Your Eating Strategy  Clinical staff conducted group or individual video education with verbal and written material and guidebook.  Patient learns about the Pritikin Eating Plan and its benefit of reducing the risk of disease. The Pritikin Eating Plan does not focus on calories. Instead, it emphasizes high-quality, nutrient-rich foods. By knowing the characteristics of the foods, we choose, we can determine their calorie density and make informed decisions.  Targeting Your Nutrition Priorities  Clinical staff conducted group or individual video education with verbal and written material and guidebook.  Patient learns that lifestyle habits have a tremendous impact on disease risk and progression. This video provides eating and physical activity recommendations based on your personal health goals, such as reducing LDL cholesterol, losing weight, preventing or controlling type 2 diabetes, and reducing high  blood pressure.  Vitamins and Minerals  Clinical staff conducted group or individual video education with verbal and written  material and guidebook.  Patient learns different ways to obtain key vitamins and minerals, including through a recommended healthy diet. It is important to discuss all supplements Theodore take with your doctor.   Healthy Mind-Set    Smoking Cessation  Clinical staff conducted group or individual video education with verbal and written material and guidebook.  Patient learns that cigarette smoking and tobacco addiction pose a serious health risk which affects millions of people. Stopping smoking will significantly reduce the risk of heart disease, lung disease, and many forms of cancer. Recommended strategies for quitting are covered, including working with your doctor to develop a successful plan.  Culinary   Becoming a Set designer conducted group or individual video education with verbal and written material and guidebook.  Patient learns that cooking at home can be healthy, cost-effective, quick, and puts them in control. Keys to cooking healthy recipes will include looking at your recipe, assessing your equipment needs, planning ahead, making it simple, choosing cost-effective seasonal ingredients, and limiting the use of added fats, salts, and sugars.  Cooking - Breakfast and Snacks  Clinical staff conducted group or individual video education with verbal and written material and guidebook.  Patient learns how important breakfast is to satiety and nutrition through the entire day. Recommendations include key foods to eat during breakfast to help stabilize blood sugar levels and to prevent overeating at meals later in the day. Planning ahead is also a key component.  Cooking - Educational psychologist conducted group or individual video education with verbal and written material and guidebook.  Patient learns eating strategies to improve overall  health, including an approach to cook more at home. Recommendations include thinking of animal protein as a side on your plate rather than center stage and focusing instead on lower calorie dense options like vegetables, fruits, whole grains, and plant-based proteins, such as beans. Making sauces in large quantities to freeze for later and leaving the skin on your vegetables are also recommended to maximize your experience.  Cooking - Healthy Salads and Dressing Clinical staff conducted group or individual video education with verbal and written material and guidebook.  Patient learns that vegetables, fruits, whole grains, and legumes are the foundations of the Pritikin Eating Plan. Recommendations include how to incorporate each of these in flavorful and healthy salads, and how to create homemade salad dressings. Proper handling of ingredients is also covered. Cooking - Soups and [employer] - Soups and Desserts Clinical staff conducted group or individual video education with verbal and written material and guidebook.  Patient learns that Pritikin soups and desserts make for easy, nutritious, and delicious snacks and meal components that are low in sodium, fat, sugar, and calorie density, while high in vitamins, minerals, and filling fiber. Recommendations include simple and healthy ideas for soups and desserts.   Overview     The Pritikin Solution Program Overview Clinical staff conducted group or individual video education with verbal and written material and guidebook.  Patient learns that the results of the Pritikin Program have been documented in more than 100 articles published in peer-reviewed journals, and the benefits include reducing risk factors for (and, in some cases, even reversing) high cholesterol, high blood pressure, type 2 diabetes, obesity, and more! An overview of the three key pillars of the Pritikin Program will be covered: eating well, doing regular exercise, and having a  healthy mind-set.  WORKSHOPS  Exercise: Exercise Basics: Building Your Action Plan Clinical staff  led group instruction and group discussion with PowerPoint presentation and patient guidebook. To enhance the learning environment the use of posters, models and videos may be added. At the conclusion of this workshop, patients will comprehend the difference between physical activity and exercise, as well as the benefits of incorporating both, into their routine. Patients will understand the FITT (Frequency, Intensity, Time, and Type) principle and how to use it to build an exercise action plan. In addition, safety concerns and other considerations for exercise and cardiac rehab will be addressed by the presenter. The purpose of this lesson is to promote a comprehensive and effective weekly exercise routine in order to improve patients' overall level of fitness.   Managing Heart Disease: Your Path to a Healthier Heart Clinical staff led group instruction and group discussion with PowerPoint presentation and patient guidebook. To enhance the learning environment the use of posters, models and videos may be added.At the conclusion of this workshop, patients will understand the anatomy and physiology of the heart. Additionally, they will understand how Pritikin's three pillars impact the risk factors, the progression, and the management of heart disease.  The purpose of this lesson is to provide a high-level overview of the heart, heart disease, and how the Pritikin lifestyle positively impacts risk factors.  Exercise Biomechanics Clinical staff led group instruction and group discussion with PowerPoint presentation and patient guidebook. To enhance the learning environment the use of posters, models and videos may be added. Patients will learn how the structural parts of their bodies function and how these functions impact their daily activities, movement, and exercise. Patients will learn how to  promote a neutral spine, learn how to manage pain, and identify ways to improve their physical movement in order to promote healthy living. The purpose of this lesson is to expose patients to common physical limitations that impact physical activity. Participants will learn practical ways to adapt and manage aches and pains, and to minimize their effect on regular exercise. Patients will learn how to maintain good posture while sitting, walking, and lifting.  Balance Training and Fall Prevention  Clinical staff led group instruction and group discussion with PowerPoint presentation and patient guidebook. To enhance the learning environment the use of posters, models and videos may be added. At the conclusion of this workshop, patients will understand the importance of their sensorimotor skills (vision, proprioception, and the vestibular system) in maintaining their ability to balance as they age. Patients will apply a variety of balancing exercises that are appropriate for their current level of function. Patients will understand the common causes for poor balance, possible solutions to these problems, and ways to modify their physical environment in order to minimize their fall risk. The purpose of this lesson is to teach patients about the importance of maintaining balance as they age and ways to minimize their risk of falling.  WORKSHOPS   Nutrition:  Fueling a Ship broker led group instruction and group discussion with PowerPoint presentation and patient guidebook. To enhance the learning environment the use of posters, models and videos may be added. Patients will review the foundational principles of the Pritikin Eating Plan and understand what constitutes a serving size in each of the food groups. Patients will also learn Pritikin-friendly foods that are better choices when away from home and review make-ahead meal and snack options. Calorie density will be reviewed and  applied to three nutrition priorities: weight maintenance, weight loss, and weight gain. The purpose of this lesson is to reinforce (  in a group setting) the key concepts around what patients are recommended to eat and how to apply these guidelines when away from home by planning and selecting Pritikin-friendly options. Patients will understand how calorie density may be adjusted for different weight management goals.  Mindful Eating  Clinical staff led group instruction and group discussion with PowerPoint presentation and patient guidebook. To enhance the learning environment the use of posters, models and videos may be added. Patients will briefly review the concepts of the Pritikin Eating Plan and the importance of low-calorie dense foods. The concept of mindful eating will be introduced as well as the importance of paying attention to internal hunger signals. Triggers for non-hunger eating and techniques for dealing with triggers will be explored. The purpose of this lesson is to provide patients with the opportunity to review the basic principles of the Pritikin Eating Plan, discuss the value of eating mindfully and how to measure internal cues of hunger and fullness using the Hunger Scale. Patients will also discuss reasons for non-hunger eating and learn strategies to use for controlling emotional eating.  Targeting Your Nutrition Priorities Clinical staff led group instruction and group discussion with PowerPoint presentation and patient guidebook. To enhance the learning environment the use of posters, models and videos may be added. Patients will learn how to determine their genetic susceptibility to disease by reviewing their family history. Patients will gain insight into the importance of diet as part of an overall healthy lifestyle in mitigating the impact of genetics and other environmental insults. The purpose of this lesson is to provide patients with the opportunity to assess their personal  nutrition priorities by looking at their family history, their own health history and current risk factors. Patients will also be able to discuss ways of prioritizing and modifying the Pritikin Eating Plan for their highest risk areas  Menu  Clinical staff led group instruction and group discussion with PowerPoint presentation and patient guidebook. To enhance the learning environment the use of posters, models and videos may be added. Using menus brought in from [employer], or printed from [employer], patients will apply the Pritikin dining out guidelines that were presented in the [employer] video. Patients will also be able to practice these guidelines in a variety of provided scenarios. The purpose of this lesson is to provide patients with the opportunity to practice hands-on learning of the Pritikin Dining Out guidelines with actual menus and practice scenarios.  Label Reading Clinical staff led group instruction and group discussion with PowerPoint presentation and patient guidebook. To enhance the learning environment the use of posters, models and videos may be added. Patients will review and discuss the Pritikin label reading guidelines presented in Pritikin's Label Reading Educational series video. Using fool labels brought in from local grocery stores and markets, patients will apply the label reading guidelines and determine if the packaged food meet the Pritikin guidelines. The purpose of this lesson is to provide patients with the opportunity to review, discuss, and practice hands-on learning of the Pritikin Label Reading guidelines with actual packaged food labels. Cooking School  Pritikin's [employer] are designed to teach patients ways to prepare quick, simple, and affordable recipes at home. The importance of nutrition's role in chronic disease risk reduction is reflected in its emphasis in the overall Pritikin program. By learning how to prepare  essential core Pritikin Eating Plan recipes, patients will increase control over what they eat; be able to customize the flavor of foods  without the use of added salt, sugar, or fat; and improve the quality of the food they consume. By learning a set of core recipes which are easily assembled, quickly prepared, and affordable, patients are more likely to prepare more healthy foods at home. These workshops focus on convenient breakfasts, simple entres, side dishes, and desserts which can be prepared with minimal effort and are consistent with nutrition recommendations for cardiovascular risk reduction. Cooking [employer] are taught by a Armed forces logistics/support/administrative officer (RD) who has been trained by the [employer]. The chef or RD has a clear understanding of the importance of minimizing - if not completely eliminating - added fat, sugar, and sodium in recipes. Throughout the series of Cooking School Workshop sessions, patients will learn about healthy ingredients and efficient methods of cooking to build confidence in their capability to prepare    Cooking School weekly topics:  Adding Flavor- Sodium-Free  Fast and Healthy Breakfasts  Powerhouse Plant-Based Proteins  Satisfying Salads and Dressings  Simple Sides and Sauces  International Cuisine-Spotlight on the [employer] Zones  Delicious Desserts  Savory Soups  [employer] - Meals in a Astronomer Appetizers and Snacks  Comforting Weekend Breakfasts  One-Pot Wonders   Fast Evening Meals  Landscape architect Your Pritikin Plate  WORKSHOPS   Healthy Mindset (Psychosocial):  Focused Goals, Sustainable Changes Clinical staff led group instruction and group discussion with PowerPoint presentation and patient guidebook. To enhance the learning environment the use of posters, models and videos may be added. Patients will be able to apply effective goal setting strategies to establish at least one personal goal, and  then take consistent, meaningful action toward that goal. They will learn to identify common barriers to achieving personal goals and develop strategies to overcome them. Patients will also gain an understanding of how our mind-set can impact our ability to achieve goals and the importance of cultivating a positive and growth-oriented mind-set. The purpose of this lesson is to provide patients with a deeper understanding of how to set and achieve personal goals, as well as the tools and strategies needed to overcome common obstacles which may arise along the way.  From Head to Heart: The Power of a Healthy Outlook  Clinical staff led group instruction and group discussion with PowerPoint presentation and patient guidebook. To enhance the learning environment the use of posters, models and videos may be added. Patients will be able to recognize and describe the impact of emotions and mood on physical health. They will discover the importance of self-care and explore self-care practices which may work for them. Patients will also learn how to utilize the 4 C's to cultivate a healthier outlook and better manage stress and challenges. The purpose of this lesson is to demonstrate to patients how a healthy outlook is an essential part of maintaining good health, especially as they continue their cardiac rehab journey.  Healthy Sleep for a Healthy Heart Clinical staff led group instruction and group discussion with PowerPoint presentation and patient guidebook. To enhance the learning environment the use of posters, models and videos may be added. At the conclusion of this workshop, patients will be able to demonstrate knowledge of the importance of sleep to overall health, well-being, and quality of life. They will understand the symptoms of, and treatments for, common sleep disorders. Patients will also be able to identify daytime and nighttime behaviors which impact sleep, and they will be able to apply these  tools to help  manage sleep-related challenges. The purpose of this lesson is to provide patients with a general overview of sleep and outline the importance of quality sleep. Patients will learn about a few of the most common sleep disorders. Patients will also be introduced to the concept of "sleep hygiene," and discover ways to self-manage certain sleeping problems through simple daily behavior changes. Finally, the workshop will motivate patients by clarifying the links between quality sleep and their goals of heart-healthy living.   Recognizing and Reducing Stress Clinical staff led group instruction and group discussion with PowerPoint presentation and patient guidebook. To enhance the learning environment the use of posters, models and videos may be added. At the conclusion of this workshop, patients will be able to understand the types of stress reactions, differentiate between acute and chronic stress, and recognize the impact that chronic stress has on their health. They will also be able to apply different coping mechanisms, such as reframing negative self-talk. Patients will have the opportunity to practice a variety of stress management techniques, such as deep abdominal breathing, progressive muscle relaxation, and/or guided imagery.  The purpose of this lesson is to educate patients on the role of stress in their lives and to provide healthy techniques for coping with it.  Learning Barriers/Preferences:  Learning Barriers/Preferences - 11/29/22 1227       Learning Barriers/Preferences   Learning Barriers Sight;Hearing   wears glasses/ hearing loss left ear   Learning Preferences Skilled Demonstration;Individual Instruction;Computer/Internet;Video;Pictoral             Education Topics:  Knowledge Questionnaire Score:  Knowledge Questionnaire Score - 11/29/22 1352       Knowledge Questionnaire Score   Pre Score 20/24             Core Components/Risk Factors/Patient Goals  at Admission:  Personal Goals and Risk Factors at Admission - 11/29/22 1234       Core Components/Risk Factors/Patient Goals on Admission   Hypertension Yes    Intervention Provide education on lifestyle modifcations including regular physical activity/exercise, weight management, moderate sodium restriction and increased consumption of fresh fruit, vegetables, and low fat dairy, alcohol moderation, and smoking cessation.;Monitor prescription use compliance.    Expected Outcomes Short Term: Continued assessment and intervention until BP is < 140/71mm HG in hypertensive participants. < 130/44mm HG in hypertensive participants with diabetes, heart failure or chronic kidney disease.;Long Term: Maintenance of blood pressure at goal levels.    Lipids Yes    Intervention Provide education and support for participant on nutrition & aerobic/resistive exercise along with prescribed medications to achieve LDL 70mg , HDL >40mg .    Expected Outcomes Short Term: Participant states understanding of desired cholesterol values and is compliant with medications prescribed. Participant is following exercise prescription and nutrition guidelines.;Long Term: Cholesterol controlled with medications as prescribed, with individualized exercise RX and with personalized nutrition plan. Value goals: LDL < 70mg , HDL > 40 mg.             Core Components/Risk Factors/Patient Goals Review:    Core Components/Risk Factors/Patient Goals at Discharge (Final Review):    ITP Comments:  ITP Comments     Row Name 11/29/22 1045 12/05/22 1631 01/01/23 1630       ITP Comments Armanda Magic, MD:  Medical Director. Introduction to the [facility] / Intensive Cardiac Rehab.  Initial orientation packet reviewed with the patient. 30 Day ITP Review. Theodore Patton started cardiac rehab on 12/05/22 and did well with exercise 30 Day ITP Review. Theodore Patton has  good attendance and participation in cardiac rehab               Comments: See ITP comments.

## 2023-01-04 ENCOUNTER — Encounter (HOSPITAL_COMMUNITY)
Admission: RE | Admit: 2023-01-04 | Discharge: 2023-01-04 | Disposition: A | Discharge status: Home / Self Care | Payer: Medicare Other | Source: Ambulatory Visit | Attending: Cardiology | Admitting: Cardiology

## 2023-01-04 ENCOUNTER — Telehealth: Payer: Self-pay | Admitting: Cardiology

## 2023-01-04 DIAGNOSIS — Z952 Presence of prosthetic heart valve: Secondary | ICD-10-CM | POA: Diagnosis present

## 2023-01-04 DIAGNOSIS — Z951 Presence of aortocoronary bypass graft: Secondary | ICD-10-CM | POA: Diagnosis present

## 2023-01-04 NOTE — Telephone Encounter (Signed)
Returned Pts call. Pt wanted to give requested BP readings to Dr. Mayford Knife and make an appt to see her before his scheduled follow up in Sept.  7/25 - 113/69 am   112/71 pm 7/26 - 104/70  100/65 7/27 - 111/77  115/73 7/28 - 117/75  120/76 7/29 - 122/79  100/65 7/30 - 105/76  122/83 7/31 - 112/77  121/76  Pt appointment scheduled for 01/31/2023.

## 2023-01-04 NOTE — Telephone Encounter (Signed)
Patient would like his echo results

## 2023-01-07 ENCOUNTER — Encounter (HOSPITAL_COMMUNITY)
Admission: RE | Admit: 2023-01-07 | Discharge: 2023-01-07 | Disposition: A | Discharge status: Home / Self Care | Payer: Medicare Other | Source: Ambulatory Visit | Attending: Cardiology | Admitting: Cardiology

## 2023-01-07 DIAGNOSIS — Z951 Presence of aortocoronary bypass graft: Secondary | ICD-10-CM

## 2023-01-07 DIAGNOSIS — Z952 Presence of prosthetic heart valve: Secondary | ICD-10-CM

## 2023-01-09 ENCOUNTER — Telehealth: Payer: Self-pay

## 2023-01-09 ENCOUNTER — Encounter (HOSPITAL_COMMUNITY): Admission: RE | Admit: 2023-01-09 | Payer: Medicare Other | Source: Ambulatory Visit

## 2023-01-09 DIAGNOSIS — Z951 Presence of aortocoronary bypass graft: Secondary | ICD-10-CM

## 2023-01-09 DIAGNOSIS — Z952 Presence of prosthetic heart valve: Secondary | ICD-10-CM

## 2023-01-09 NOTE — Telephone Encounter (Signed)
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  Prior to his heart surgery his EF was normal.  His last serum creatinine was 1.14 and BP when he saw Eligha Bridegroom 11/02/2022 was 100/66 mmHg.  Unfortunately he has not been able to tolerate GDMT due to soft BP.

## 2023-01-09 NOTE — Telephone Encounter (Signed)
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  Unfortunately he has not been able to tolerate GDMT due to soft BP so explained there are no changes to medications at this time. Patient also turned in BP log which Dr. Mayford Knife reviewed. Dr. Mayford Knife stated current BP's were good and to continue current medication regimen.

## 2023-01-09 NOTE — Telephone Encounter (Signed)
See note from today on Echo results. Called to discuss with patient, no answer. Left detailed message per DPR stating that Dr. Mayford Knife has reviewed BP readings and advises to continue current medication regimen.

## 2023-01-11 ENCOUNTER — Encounter (HOSPITAL_COMMUNITY): Payer: Medicare Other

## 2023-01-14 ENCOUNTER — Encounter (HOSPITAL_COMMUNITY): Payer: Medicare Other

## 2023-01-16 ENCOUNTER — Encounter (HOSPITAL_COMMUNITY)
Admission: RE | Admit: 2023-01-16 | Discharge: 2023-01-16 | Disposition: A | Discharge status: Home / Self Care | Payer: Medicare Other | Source: Ambulatory Visit | Attending: Cardiology | Admitting: Cardiology

## 2023-01-16 DIAGNOSIS — Z951 Presence of aortocoronary bypass graft: Secondary | ICD-10-CM | POA: Diagnosis not present

## 2023-01-16 DIAGNOSIS — Z952 Presence of prosthetic heart valve: Secondary | ICD-10-CM

## 2023-01-18 ENCOUNTER — Encounter (HOSPITAL_COMMUNITY)
Admission: RE | Admit: 2023-01-18 | Discharge: 2023-01-18 | Disposition: A | Discharge status: Home / Self Care | Payer: Medicare Other | Source: Ambulatory Visit | Attending: Cardiology | Admitting: Cardiology

## 2023-01-18 DIAGNOSIS — Z951 Presence of aortocoronary bypass graft: Secondary | ICD-10-CM | POA: Diagnosis not present

## 2023-01-18 DIAGNOSIS — Z952 Presence of prosthetic heart valve: Secondary | ICD-10-CM

## 2023-01-18 NOTE — Progress Notes (Signed)
Reviewed home exercise Rx with patient today.  Encouraged warm-up, cool-down, and stretching. Reviewed THRR of  58 - 116 and keeping RPE between 11-13. Encouraged to hydrate with activity.  Reviewed weather parameters for temperature and humidity for safe exercise outdoors. Reviewed S/S to terminate exercise and when to call 911 vs MD. Pt encouraged to always carry a cell phone for safety when exercising outdoors. Pt verbalized understanding of the home exercise Rx and was provided a copy.   Lorin Picket MS, ACSM-CEP, CCRP

## 2023-01-21 ENCOUNTER — Encounter (HOSPITAL_COMMUNITY)
Admission: RE | Admit: 2023-01-21 | Discharge: 2023-01-21 | Disposition: A | Discharge status: Home / Self Care | Payer: Medicare Other | Source: Ambulatory Visit | Attending: Cardiology | Admitting: Cardiology

## 2023-01-21 DIAGNOSIS — Z951 Presence of aortocoronary bypass graft: Secondary | ICD-10-CM

## 2023-01-21 DIAGNOSIS — Z952 Presence of prosthetic heart valve: Secondary | ICD-10-CM

## 2023-01-23 ENCOUNTER — Encounter (HOSPITAL_COMMUNITY)
Admission: RE | Admit: 2023-01-23 | Discharge: 2023-01-23 | Disposition: A | Discharge status: Home / Self Care | Payer: Medicare Other | Source: Ambulatory Visit | Attending: Cardiology | Admitting: Cardiology

## 2023-01-23 DIAGNOSIS — Z952 Presence of prosthetic heart valve: Secondary | ICD-10-CM

## 2023-01-23 DIAGNOSIS — Z951 Presence of aortocoronary bypass graft: Secondary | ICD-10-CM | POA: Diagnosis not present

## 2023-01-25 ENCOUNTER — Encounter (HOSPITAL_COMMUNITY)
Admission: RE | Admit: 2023-01-25 | Discharge: 2023-01-25 | Disposition: A | Discharge status: Home / Self Care | Payer: Medicare Other | Source: Ambulatory Visit | Attending: Cardiology | Admitting: Cardiology

## 2023-01-25 DIAGNOSIS — Z951 Presence of aortocoronary bypass graft: Secondary | ICD-10-CM | POA: Diagnosis not present

## 2023-01-25 DIAGNOSIS — Z952 Presence of prosthetic heart valve: Secondary | ICD-10-CM

## 2023-01-28 ENCOUNTER — Encounter (HOSPITAL_COMMUNITY)
Admission: RE | Admit: 2023-01-28 | Discharge: 2023-01-28 | Disposition: A | Discharge status: Home / Self Care | Payer: Medicare Other | Source: Ambulatory Visit | Attending: Cardiology | Admitting: Cardiology

## 2023-01-28 DIAGNOSIS — Z951 Presence of aortocoronary bypass graft: Secondary | ICD-10-CM

## 2023-01-28 DIAGNOSIS — Z952 Presence of prosthetic heart valve: Secondary | ICD-10-CM

## 2023-01-29 NOTE — Progress Notes (Signed)
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 Discontinue once prescription runs out (Patient not taking: Reported on 11/29/2022), Disp: , Rfl:    aspirin EC 81 MG tablet, Take 1 tablet (81 mg total) by mouth daily. Swallow whole., Disp: 150 tablet, Rfl: 2   buPROPion (WELLBUTRIN XL) 150 MG 24 hr tablet, Take 300 mg by mouth in the morning., Disp: , Rfl:    cetirizine (ZYRTEC) 10 MG tablet, Take 10 mg by mouth at bedtime., Disp: , Rfl:    colchicine 0.6 MG tablet, Take 0.6 mg by mouth every 6 (six) hours as needed (gout flares). , Disp: , Rfl:    famotidine (PEPCID) 20 MG tablet, Take 20 mg by mouth at bedtime., Disp: , Rfl:    Fe Fum-Vit C-Vit B12-FA (TRIGELS-F FORTE) CAPS capsule, Take 1 capsule by mouth daily after breakfast. May substitute with similar generic medication (Patient not taking: Reported on 11/29/2022), Disp: 30 capsule, Rfl: 0   fluticasone (FLONASE) 50 MCG/ACT nasal spray, Place 1-2 sprays into both nostrils at bedtime as needed for allergies., Disp: , Rfl:    gabapentin (NEURONTIN) 300 MG capsule, Take 600 mg by  mouth 3 (three) times daily., Disp: , Rfl:    Glucosamine-Chondroitin (COSAMIN DS PO), Take 1 tablet by mouth 2 (two) times daily. Triple Strength, Disp: , Rfl:    Multiple Vitamin (MULTIVITAMIN WITH MINERALS) TABS tablet, Take 1 tablet by mouth daily. Complete Multivitamin, Disp: , Rfl:    Probiotic Product (ALIGN) 4 MG CAPS, Take 4 mg by mouth daily as needed (gut health)., Disp: , Rfl:    rosuvastatin (CRESTOR) 10 MG tablet, Take 10 mg by mouth in the morning., Disp: , Rfl:    SALINE NASAL MIST NA, Place 1 spray into the nose 3 (three) times daily as needed (congestion)., Disp: , Rfl:    tamsulosin (FLOMAX) 0.4 MG CAPS capsule, Take 0.4 mg by mouth every evening., Disp: , Rfl:    TURMERIC PO, Take 1,000 mg by mouth in the morning and at bedtime., Disp: , Rfl:   Past Medical History: Past Medical History:  Diagnosis Date   Aortic regurgitation 01/07/2019   severe and eccentric by TEE [id number] with RF 69%.   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    Tobacco Use: Social History   Tobacco Use  Smoking Status Former   Current packs/day: 0.00   Types: Cigarettes   Quit date: 07/12/1980   Years since quitting: 42.5  Smokeless Tobacco Never    Labs: Review Flowsheet  More data exists      Latest Ref Rng & Units 09/28/2022 10/16/2022 10/18/2022 10/19/2022 11/02/2022  Labs for ITP Cardiac and Pulmonary Rehab  Cholestrol 100 - 199 mg/dL - - - - 90   LDL (calc) 0 - 99 mg/dL - - - - 32   HDL-C >16 mg/dL - - - - 37   Trlycerides 0 - 149 mg/dL - - - - 109   Hemoglobin A1c 4.8 -  5.6 % - 5.5  - - -  PH, Arterial 7.35 - 7.45 7.326  7.4  7.411  7.396  7.335  7.371  7.365  7.390  -  PCO2 arterial 32 - 48 mmHg 46.3  35  32.8  35.9  41.7  42.2  40.2  38.7  -  Bicarbonate 20.0 - 28.0 mmol/L 23.1  24.4  24.2  21.7  21.2  22.0  22.2  24.4  22.9  23.2  -  TCO2 22 - 32 mmol/L 24  26  26   - 22  22  23  24  24  23  26  26  27  24  24   -  Acid-base deficit 0.0 - 2.0 mmol/L 3.0  2.0  2.0  2.5  3.0  3.0  3.0  1.0  2.0  1.0  -  O2 Saturation % 67  69  93  98  97  99  100  100  99  99  -    Details       Multiple values from one day are sorted in reverse-chronological order         Capillary Blood Glucose: Lab Results  Component Value Date   GLUCAP 106 (H) 10/23/2022   GLUCAP 124 (H) 10/22/2022   GLUCAP 112 (H) 10/21/2022   GLUCAP 114 (H) 10/21/2022   GLUCAP 133 (H) 10/21/2022     Exercise Target Goals: Exercise Program Goal: Individual exercise prescription set using results from initial 6 min walk test and THRR while considering  patient's activity barriers and safety.   Exercise Prescription Goal: Initial exercise prescription builds to 30-45 minutes a day of aerobic activity, 2-3 days per week.  Home exercise guidelines will be given to patient during program as part of exercise prescription that the participant will acknowledge.  Activity Barriers & Risk Stratification:  Activity Barriers & Cardiac Risk Stratification - 11/29/22 1224       Activity Barriers & Cardiac Risk Stratification   Activity Barriers Arthritis;Back Problems;Right Knee Replacement;Joint Problems;Deconditioning;Muscular Weakness;Balance Concerns;Other (comment)    Comments Sternal Precautions    Cardiac Risk Stratification High             6 Minute Walk:  6 Minute Walk     Row Name 11/29/22 1142         6 Minute Walk   Phase Initial  Used Go-cart     Distance 1233 feet     Walk Time 6 minutes     # of Rest Breaks 0     MPH 2.34     METS 2.45     RPE 10  Perceived  Dyspnea  0     VO2 Peak 8.6     Symptoms No     Resting HR 75 bpm     Resting BP 116/80     Resting Oxygen Saturation  98 %     Exercise Oxygen Saturation  during 6 min walk 98 %     Max Ex. HR 90 bpm     Max Ex. BP 110/70     2 Minute Post BP 120/80              Oxygen Initial Assessment:   Oxygen Re-Evaluation:   Oxygen Discharge (Final Oxygen Re-Evaluation):   Initial Exercise Prescription:  Initial Exercise Prescription - 11/29/22 1200       Date of Initial Exercise RX and Referring Provider   Date 11/29/22    Referring Provider Armanda Magic, MD    Expected Discharge Date 02/08/23      NuStep   Level 1    SPM 80    Minutes 15    METs 2.45      Arm Ergometer   Level 1    Watts 25    RPM 60    Minutes 15    METs 2.45      Prescription Details   Frequency (times per week) 3    Duration Progress to 30 minutes of continuous aerobic without signs/symptoms of physical distress      Intensity   THRR 40-80% of Max Heartrate 58-116    Ratings of Perceived Exertion 11-13    Perceived Dyspnea 0-4      Progression   Progression Continue progressive overload as per policy without signs/symptoms or physical distress.      Resistance Training   Training Prescription Yes    Weight 3 lbs    Reps 10-15             Perform Capillary Blood Glucose checks as needed.  Exercise Prescription Changes:   Exercise Prescription Changes     Row Name 12/05/22 1400 12/24/22 1500 12/26/22 1000 01/16/23 1400 01/18/23 1500     Response to Exercise   Blood Pressure (Admit) 114/80 120/68 -- 118/62 104/60   Blood Pressure (Exercise) 120/80 146/84 -- 136/82 122/68   Blood Pressure (Exit) 102/68 122/64 -- 102/78 104/64   Heart Rate (Admit) 69 bpm 79 bpm -- 71 bpm 89 bpm   Heart Rate (Exercise) 96 bpm 108 bpm -- 110 bpm 102 bpm   Heart Rate (Exit) 78 bpm 79 bpm -- 82 bpm 89 bpm   Rating of Perceived Exertion (Exercise) 11.5 11 -- 11 12   Symptoms None None -- None  None   Comments Pt's first day in the CRP2 program Reviewed METs -- Reveiwed METs and goals Reviewed home exercsie Rx   Duration Continue with 30 min of aerobic exercise without signs/symptoms of physical distress. Continue with 30 min of aerobic exercise without signs/symptoms of physical distress. -- Continue with 30 min of aerobic exercise without signs/symptoms of physical distress. Continue with 30 min of aerobic exercise without signs/symptoms of physical distress.   Intensity THRR unchanged THRR unchanged -- THRR unchanged THRR unchanged     Progression   Progression Continue to progress workloads to maintain intensity without signs/symptoms of physical distress. Continue to progress workloads to maintain intensity without signs/symptoms of physical distress. -- Continue to progress workloads to maintain intensity without signs/symptoms of physical distress. Continue to progress workloads to maintain intensity without signs/symptoms of physical distress.  Average METs 1.85 2.2 -- 2.35 2.45     Resistance Training   Training Prescription No Yes -- Yes Yes   Weight No weights on Wednesdays 3 lbs -- 3 lbs 3 lbs   Reps -- 10-15 -- 10-15 10-15   Time -- 10 Minutes -- 10 Minutes 10 Minutes     Interval Training   Interval Training No No -- No No     NuStep   Level 1 1 -- 3 3   SPM -- 98 -- 106 105   Minutes 15 15 -- 15 15   METs 2.2 2.7 -- 3.1 3.1     Arm Ergometer   Level 1 1 -- 1 1   Watts 9 11 -- 11 13   RPM 39 -- -- 45 49   Minutes 15 15 -- 15 15   METs 1.5 1.7 -- 1.6 1.8     Home Exercise Plan   Plans to continue exercise at -- -- -- -- Home (comment)   Frequency -- -- -- -- Add 2 additional days to program exercise sessions.   Initial Home Exercises Provided -- -- -- -- 01/18/23            Exercise Comments:   Exercise Comments     Row Name 12/05/22 1422 12/24/22 1500 12/26/22 1059 01/16/23 1412 01/18/23 1557   Exercise Comments Pt's first day in the CRP2  program. Pt exercised without complaints. Reviewed METs. Pt is progressing. -- Reviewed METs and goals. Pt is making slow, but steady progress. Will encourage increasing workloads on modalities next session. Reviewed Home exercise Rx. Discussed walking and using the recumbent elliptical he has at home. Encouraged 30 minutes per day 5-7x/week. Pt verbalized understanding of the home exercise Rx and was provided a copy.            Exercise Goals and Review:   Exercise Goals     Row Name 11/29/22 1214             Exercise Goals   Increase Physical Activity Yes       Intervention Provide advice, education, support and counseling about physical activity/exercise needs.;Develop an individualized exercise prescription for aerobic and resistive training based on initial evaluation findings, risk stratification, comorbidities and participant's personal goals.       Expected Outcomes Short Term: Attend rehab on a regular basis to increase amount of physical activity.;Long Term: Add in home exercise to make exercise part of routine and to increase amount of physical activity.;Long Term: Exercising regularly at least 3-5 days a week.       Increase Strength and Stamina Yes       Intervention Provide advice, education, support and counseling about physical activity/exercise needs.;Develop an individualized exercise prescription for aerobic and resistive training based on initial evaluation findings, risk stratification, comorbidities and participant's personal goals.       Expected Outcomes Short Term: Increase workloads from initial exercise prescription for resistance, speed, and METs.;Short Term: Perform resistance training exercises routinely during rehab and add in resistance training at home;Long Term: Improve cardiorespiratory fitness, muscular endurance and strength as measured by increased METs and functional capacity ( )       Able to understand and use rate of perceived exertion (RPE) scale  Yes       Intervention Provide education and explanation on how to use RPE scale       Expected Outcomes Short Term: Able to use RPE daily in rehab to express subjective intensity  level;Long Term:  Able to use RPE to guide intensity level when exercising independently       Knowledge and understanding of Target Heart Rate Range (THRR) Yes       Intervention Provide education and explanation of THRR including how the numbers were predicted and where they are located for reference       Expected Outcomes Short Term: Able to state/look up THRR;Short Term: Able to use daily as guideline for intensity in rehab;Long Term: Able to use THRR to govern intensity when exercising independently       Understanding of Exercise Prescription Yes       Intervention Provide education, explanation, and written materials on patient's individual exercise prescription       Expected Outcomes Short Term: Able to explain program exercise prescription;Long Term: Able to explain home exercise prescription to exercise independently                Exercise Goals Re-Evaluation :  Exercise Goals Re-Evaluation     Row Name 12/05/22 1420 01/16/23 1407           Exercise Goal Re-Evaluation   Exercise Goals Review Increase Physical Activity;Understanding of Exercise Prescription;Increase Strength and Stamina;Knowledge and understanding of Target Heart Rate Range (THRR);Able to understand and use rate of perceived exertion (RPE) scale Increase Physical Activity;Understanding of Exercise Prescription;Increase Strength and Stamina;Knowledge and understanding of Target Heart Rate Range (THRR);Able to understand and use rate of perceived exertion (RPE) scale      Comments Pt's first day in the CRP2 program. Pt understands the exercise Rx, REP scale and THRR. Reviewed METs and goals. Pt has peak METs of 3.2. Pt voices he is making progress on his goals. Pt voices improvement in upperbody and leg strength, increased stamia,  endurance and energy. Pt plans to join a gym. Will discuss home exercise with hime soon.      Expected Outcomes Will continue to monitor patient and progress exercise workloads as tolerated. Will continue to monitor patient and progress exercise workloads as tolerated.               Discharge Exercise Prescription (Final Exercise Prescription Changes):  Exercise Prescription Changes - 01/18/23 1500       Response to Exercise   Blood Pressure (Admit) 104/60    Blood Pressure (Exercise) 122/68    Blood Pressure (Exit) 104/64    Heart Rate (Admit) 89 bpm    Heart Rate (Exercise) 102 bpm    Heart Rate (Exit) 89 bpm    Rating of Perceived Exertion (Exercise) 12    Symptoms None    Comments Reviewed home exercsie Rx    Duration Continue with 30 min of aerobic exercise without signs/symptoms of physical distress.    Intensity THRR unchanged      Progression   Progression Continue to progress workloads to maintain intensity without signs/symptoms of physical distress.    Average METs 2.45      Resistance Training   Training Prescription Yes    Weight 3 lbs    Reps 10-15    Time 10 Minutes      Interval Training   Interval Training No      NuStep   Level 3    SPM 105    Minutes 15    METs 3.1      Arm Ergometer   Level 1    Watts 13    RPM 49    Minutes 15    METs 1.8  Home Exercise Plan   Plans to continue exercise at Home (comment)    Frequency Add 2 additional days to program exercise sessions.    Initial Home Exercises Provided 01/18/23             Nutrition:  Target Goals: Understanding of nutrition guidelines, daily intake of sodium 1500mg , cholesterol 200mg , calories 30% from fat and 7% or less from saturated fats, daily to have 5 or more servings of fruits and vegetables.  Biometrics:  Pre Biometrics - 11/29/22 1055       Pre Biometrics   Waist Circumference 40.5 inches    Hip Circumference 40 inches    Waist to Hip Ratio 1.01 %     Triceps Skinfold 12 mm    Grip Strength 42 kg    Flexibility 0 in   could not reach   Single Leg Stand 2 seconds             Post Biometrics - 11/29/22 1055        Post  Biometrics   % Body Fat 26.2 %             Nutrition Therapy Plan and Nutrition Goals:  Nutrition Therapy & Goals - 01/25/23 1343       Nutrition Therapy   Diet Heart Healthy Diet    Drug/Food Interactions Statins/Certain Fruits      Personal Nutrition Goals   Nutrition Goal Patient to identify strategies for reducing cardiovascular risk by attending the [employer] education and nutrition series weekly.   goal in action.   Personal Goal #2 Patient to improve diet quality by using the plate method as a guide for meal planning to include lean protein/plant protein, fruits, vegetables, whole grains, nonfat dairy as part of a well-balanced diet.   goal in action.   Personal Goal #3 Patient to reduce sodium to 1500mg  per day   goal in action.   Personal Goal #4 Patient to identify strategies for weight maintenance and weight gain of 0.5-2.0# as needed.   goal in progress.   Comments Goals in action. Theodore Patton and his wife continue to attend the [employer] education and nutrition series regularly. They have started making many dietary changes including implemented many Pritikin recipes, increased dietary fiber, and reduced saturated fat intake. Theodore Patton does report history of unwanted weight loss since shoulder surgery 03/2022 and cardiac surgery in 10/2022; his weight prior to first surgery was 196# per documentation on 02/12/2022. He is motivated to maintain his current weight and possibly gain a little weight back. We discussed strategies for weight maintenance/weight gain including increasing portion sizes of fat/protein/carbohydrates and increasing eating frequency. He is down 5.3# since starting with our program; BMI remains appropriate for age. Patient will benefit from participation in intensive cardiac rehab for nutrition,  exercise, and lifestyle modification.      Intervention Plan   Intervention Prescribe, educate and counsel regarding individualized specific dietary modifications aiming towards targeted core components such as weight, hypertension, lipid management, diabetes, heart failure and other comorbidities.;Nutrition handout(s) given to patient.    Expected Outcomes Short Term Goal: Understand basic principles of dietary content, such as calories, fat, sodium, cholesterol and nutrients.;Long Term Goal: Adherence to prescribed nutrition plan.             Nutrition Assessments:  Nutrition Assessments - 12/25/22 0937       Rate Your Plate Scores   Pre Score 58            MEDIFICTS Score  Key: ?70 Need to make dietary changes  40-70 Heart Healthy Diet ? 40 Therapeutic Level Cholesterol Diet   Flowsheet Row INTENSIVE CARDIAC REHAB from 12/24/2022 in [facility]  Picture Your Plate Total Score on Admission 58      Picture Your Plate Scores: <16 Unhealthy dietary pattern with much room for improvement. 41-50 Dietary pattern unlikely to meet recommendations for good health and room for improvement. 51-60 More healthful dietary pattern, with some room for improvement.  >60 Healthy dietary pattern, although there may be some specific behaviors that could be improved.    Nutrition Goals Re-Evaluation:  Nutrition Goals Re-Evaluation     Row Name 12/05/22 1329 12/24/22 0941 01/25/23 1343         Goals   Current Weight 190 lb 7.6 oz (86.4 kg) 187 lb 6.3 oz (85 kg) 185 lb 3 oz (84 kg)     Comment HDL 37, lipoprotein A WNL, hemoglobin 9.7, hematocrit 29.8 no new labs; most recent labs HDL 37, lipoprotein A WNL, hemoglobin 9.7, hematocrit 29.8 no new labs; most recent labs HDL 37, lipoprotein A WNL, hemoglobin 9.7, hematocrit 29.8     Expected Outcome Theodore Patton reports that he and his wife "eat the wrong things" but are receptive to making  dietary/lifestyle changes. Patient will benefit from participation in intensive cardiac rehab for nutrition, exercise, and lifestyle modification. Goals in action. Theodore Patton and his wife continue to attend the [employer] education and nutrition series regularly. They have started making many dietary changes including implemented many Pritikin recipes, increased dietary fiber, and reduced saturated fat intake. Theodore Patton does report history of weight loss since shoulder surgery 03/2022 and cardiac surgery in 10/2022; his weight prior to first surgery was 196# per documentation on 02/12/2022. He is motivated to maintain his current weight and possibly gain a little weight back. We discussed strategies for weight maintenance including increasing portion sizes of fat/protein/carbohydrates and increasing eating frequency. Patient will benefit from participation in intensive cardiac rehab for nutrition, exercise, and lifestyle modification. Goals in action. Theodore Patton and his wife continue to attend the [employer] education and nutrition series regularly. They have started making many dietary changes including implemented many Pritikin recipes, increased dietary fiber, and reduced saturated fat intake. Theodore Patton does report history of unwanted weight loss since shoulder surgery 03/2022 and cardiac surgery in 10/2022; his weight prior to first surgery was 196# per documentation on 02/12/2022. He is motivated to maintain his current weight and possibly gain a little weight back. We discussed strategies for weight maintenance/weight gain including increasing portion sizes of fat/protein/carbohydrates and increasing eating frequency. He is down 5.3# since starting with our program; BMI remains appropriate for age. Patient will benefit from participation in intensive cardiac rehab for nutrition, exercise, and lifestyle modification.              Nutrition Goals Re-Evaluation:  Nutrition Goals Re-Evaluation     Row Name 12/05/22 1329 12/24/22  0941 01/25/23 1343         Goals   Current Weight 190 lb 7.6 oz (86.4 kg) 187 lb 6.3 oz (85 kg) 185 lb 3 oz (84 kg)     Comment HDL 37, lipoprotein A WNL, hemoglobin 9.7, hematocrit 29.8 no new labs; most recent labs HDL 37, lipoprotein A WNL, hemoglobin 9.7, hematocrit 29.8 no new labs; most recent labs HDL 37, lipoprotein A WNL, hemoglobin 9.7, hematocrit 29.8     Expected Outcome Theodore Patton reports that he and  his wife "eat the wrong things" but are receptive to making dietary/lifestyle changes. Patient will benefit from participation in intensive cardiac rehab for nutrition, exercise, and lifestyle modification. Goals in action. Theodore Patton and his wife continue to attend the [employer] education and nutrition series regularly. They have started making many dietary changes including implemented many Pritikin recipes, increased dietary fiber, and reduced saturated fat intake. Theodore Patton does report history of weight loss since shoulder surgery 03/2022 and cardiac surgery in 10/2022; his weight prior to first surgery was 196# per documentation on 02/12/2022. He is motivated to maintain his current weight and possibly gain a little weight back. We discussed strategies for weight maintenance including increasing portion sizes of fat/protein/carbohydrates and increasing eating frequency. Patient will benefit from participation in intensive cardiac rehab for nutrition, exercise, and lifestyle modification. Goals in action. Theodore Patton and his wife continue to attend the [employer] education and nutrition series regularly. They have started making many dietary changes including implemented many Pritikin recipes, increased dietary fiber, and reduced saturated fat intake. Theodore Patton does report history of unwanted weight loss since shoulder surgery 03/2022 and cardiac surgery in 10/2022; his weight prior to first surgery was 196# per documentation on 02/12/2022. He is motivated to maintain his current weight and possibly gain a little weight back. We  discussed strategies for weight maintenance/weight gain including increasing portion sizes of fat/protein/carbohydrates and increasing eating frequency. He is down 5.3# since starting with our program; BMI remains appropriate for age. Patient will benefit from participation in intensive cardiac rehab for nutrition, exercise, and lifestyle modification.              Nutrition Goals Discharge (Final Nutrition Goals Re-Evaluation):  Nutrition Goals Re-Evaluation - 01/25/23 1343       Goals   Current Weight 185 lb 3 oz (84 kg)    Comment no new labs; most recent labs HDL 37, lipoprotein A WNL, hemoglobin 9.7, hematocrit 29.8    Expected Outcome Goals in action. Theodore Patton and his wife continue to attend the [employer] education and nutrition series regularly. They have started making many dietary changes including implemented many Pritikin recipes, increased dietary fiber, and reduced saturated fat intake. Theodore Patton does report history of unwanted weight loss since shoulder surgery 03/2022 and cardiac surgery in 10/2022; his weight prior to first surgery was 196# per documentation on 02/12/2022. He is motivated to maintain his current weight and possibly gain a little weight back. We discussed strategies for weight maintenance/weight gain including increasing portion sizes of fat/protein/carbohydrates and increasing eating frequency. He is down 5.3# since starting with our program; BMI remains appropriate for age. Patient will benefit from participation in intensive cardiac rehab for nutrition, exercise, and lifestyle modification.             Psychosocial: Target Goals: Acknowledge presence or absence of significant depression and/or stress, maximize coping skills, provide positive support system. Participant is able to verbalize types and ability to use techniques and skills needed for reducing stress and depression.  Initial Review & Psychosocial Screening:  Initial Psych Review & Screening - 11/29/22  1225       Initial Review   Current issues with Current Sleep Concerns;History of Depression   Voices he is not currently depressed. Pt feels his Wellbutrin is working well for him.     Family Dynamics   Good Support System? Yes   Has spouse for support     Barriers   Psychosocial barriers to participate in program There are no identifiable barriers or  psychosocial needs.      Screening Interventions   Interventions Encouraged to exercise             Quality of Life Scores:  Quality of Life - 11/29/22 1349       Quality of Life   Select Quality of Life      Quality of Life Scores   Health/Function Pre 21 %    Socioeconomic Pre 22.14 %    Psych/Spiritual Pre 24 %    Family Pre 20.5 %    GLOBAL Pre 21.78 %            Scores of 19 and below usually indicate a poorer quality of life in these areas.  A difference of  2-3 points is a clinically meaningful difference.  A difference of 2-3 points in the total score of the Quality of Life Index has been associated with significant improvement in overall quality of life, self-image, physical symptoms, and general health in studies assessing change in quality of life.  PHQ-9: Review Flowsheet       11/29/2022  Depression screen PHQ 2/9  Decreased Interest 1  Down, Depressed, Hopeless 0  PHQ - 2 Score 1  Altered sleeping 1  Tired, decreased energy 1  Change in appetite 0  Feeling bad or failure about yourself  0  Trouble concentrating 2  Moving slowly or fidgety/restless 2  Suicidal thoughts 0  PHQ-9 Score 7  Difficult doing work/chores Somewhat difficult    Details           Interpretation of Total Score  Total Score Depression Severity:  1-4 = Minimal depression, 5-9 = Mild depression, 10-14 = Moderate depression, 15-19 = Moderately severe depression, 20-27 = Severe depression   Psychosocial Evaluation and Intervention:   Psychosocial Re-Evaluation:  Psychosocial Re-Evaluation     Row Name 01/01/23  1640 01/29/23 1629           Psychosocial Re-Evaluation   Current issues with Current Stress Concerns;History of Depression Current Stress Concerns;History of Depression      Comments Theodore Patton says he feels better and does not have any current concerns or stressors No concerns or stressors have been voiced. Theodore Patton will complete cardiac rehab on 03/01/23      Expected Outcomes Theodore Patton will have decreased or controlled stress and depressio upon completion of cardiac rehab Theodore Patton will have decreased or controlled stress and depressio upon completion of cardiac rehab      Interventions Stress management education;Encouraged to attend Cardiac Rehabilitation for the exercise;Relaxation education Stress management education;Encouraged to attend Cardiac Rehabilitation for the exercise;Relaxation education      Continue Psychosocial Services  Follow up required by staff Follow up required by staff        Initial Review   Source of Stress Concerns Chronic Illness;Unable to participate in former interests or hobbies;Unable to perform yard/household activities Chronic Illness;Unable to participate in former interests or hobbies;Unable to perform yard/household activities      Comments Will continue to monitor and offer support as needed Will continue to monitor and offer support as needed               Psychosocial Discharge (Final Psychosocial Re-Evaluation):  Psychosocial Re-Evaluation - 01/29/23 1629       Psychosocial Re-Evaluation   Current issues with Current Stress Concerns;History of Depression    Comments No concerns or stressors have been voiced. Theodore Patton will complete cardiac rehab on 03/01/23    Expected Outcomes Theodore Patton will have decreased  or controlled stress and depressio upon completion of cardiac rehab    Interventions Stress management education;Encouraged to attend Cardiac Rehabilitation for the exercise;Relaxation education    Continue Psychosocial Services  Follow up required by staff       Initial Review   Source of Stress Concerns Chronic Illness;Unable to participate in former interests or hobbies;Unable to perform yard/household activities    Comments Will continue to monitor and offer support as needed             Vocational Rehabilitation: Provide vocational rehab assistance to qualifying candidates.   Vocational Rehab Evaluation & Intervention:  Vocational Rehab - 11/29/22 1234       Initial Vocational Rehab Evaluation & Intervention   Assessment shows need for Vocational Rehabilitation No   Pt is retired            Education: Education Goals: Education classes will be provided on a weekly basis, covering required topics. Participant will state understanding/return demonstration of topics presented.    Education     Row Name 12/05/22 1600     Education   Cardiac Education Topics Pritikin   Environmental education officer - Meals in a Snap   Instruction Review Code 1- Verbalizes Understanding   Class Start Time 1400   Class Stop Time 1445   Class Time Calculation (min) 45 min    Row Name 12/07/22 1400     Education   Cardiac Education Topics Pritikin   Psychologist, forensic Exercise Education   Exercise Education Move It!   Instruction Review Code 1- Verbalizes Understanding   Class Start Time 1400   Class Stop Time 1445   Class Time Calculation (min) 45 min    Row Name 12/10/22 1400     Education   Cardiac Education Topics Pritikin   Immunologist Exercise Physiologist   Select Psychosocial   Psychosocial Workshop Focused Goals, Sustainable Changes   Instruction Review Code 1- Verbalizes Understanding   Class Start Time 1355   Class Stop Time 1437   Class Time Calculation (min) 42 min    Row Name 12/12/22 1600     Education   Cardiac Education Topics Pritikin   Research scientist (life sciences)   Weekly Topic One-Pot Wonders   Instruction Review Code 1- Verbalizes Understanding   Class Start Time 1400   Class Stop Time 1450   Class Time Calculation (min) 50 min    Row Name 12/14/22 1500     Education   Cardiac Education Topics Pritikin   Hospital doctor Education   General Education Hypertension and Heart Disease   Instruction Review Code 1- Verbalizes Understanding   Class Start Time 1400   Class Stop Time 1450   Class Time Calculation (min) 50 min    Row Name 12/19/22 1600     Education   Cardiac Education Topics Pritikin   Customer service manager   Weekly Topic Comforting Weekend Breakfasts   Instruction Review Code 1- Verbalizes Understanding   Class Start Time 1400   Class Stop Time 1445   Class Time  Calculation (min) 45 min    Row Name 12/24/22 1500     Education   Cardiac Education Topics Pritikin   Select Core Videos     Core Videos   Educator Dietitian   Select Nutrition   Nutrition Facts on Fat   Instruction Review Code 1- Verbalizes Understanding   Class Start Time 1400   Class Stop Time 1440   Class Time Calculation (min) 40 min    Row Name 12/26/22 1500     Education   Cardiac Education Topics Pritikin   Customer service manager   Weekly Topic Fast Evening Meals   Instruction Review Code 1- Verbalizes Understanding   Class Start Time 1400   Class Stop Time 1440   Class Time Calculation (min) 40 min    Row Name 12/28/22 1500     Education   Cardiac Education Topics Pritikin   Licensed conveyancer Nutrition   Nutrition Vitamins and Minerals   Instruction Review Code 1- Verbalizes Understanding   Class Start Time 1400   Class Stop Time 1445   Class Time Calculation (min) 45 min     Row Name 12/31/22 1600     Education   Cardiac Education Topics Pritikin   Geographical information systems officer Psychosocial   Psychosocial Workshop Healthy Sleep for a Healthy Heart   Instruction Review Code 1- Verbalizes Understanding   Class Start Time 1400   Class Stop Time 1455   Class Time Calculation (min) 55 min    Row Name 01/02/23 1600     Education   Cardiac Education Topics Pritikin   Customer service manager   Weekly Topic International Cuisine- Spotlight on the [employer] Zones   Instruction Review Code 1- Verbalizes Understanding   Class Start Time 1400   Class Stop Time 1448   Class Time Calculation (min) 48 min    Row Name 01/04/23 1500     Education   Cardiac Education Topics Pritikin   Psychologist, forensic Exercise Education   Exercise Education Improving Performance   Instruction Review Code 1- Verbalizes Understanding   Class Start Time 1400   Class Stop Time 1455   Class Time Calculation (min) 55 min    Row Name 01/07/23 1500     Education   Cardiac Education Topics Pritikin   Glass blower/designer Nutrition   Nutrition Workshop Fueling a Forensic psychologist   Instruction Review Code 1- Tax inspector   Class Start Time 1400   Class Stop Time 1452   Class Time Calculation (min) 52 min    Row Name 01/09/23 1500     Education   Cardiac Education Topics Pritikin   Customer service manager   Weekly Topic Simple Sides and Sauces   Instruction Review Code 1- Verbalizes Understanding   Class Start Time 1400   Class Stop Time 1445   Class Time Calculation (min) 45 min    Row Name 01/16/23 1500     Education   Cardiac Education Topics Pritikin   [employer]  Secondary school teacher   Weekly Topic  Powerhouse Plant-Based Proteins   Instruction Review Code 1- Verbalizes Understanding   Class Start Time 1400   Class Stop Time 1440   Class Time Calculation (min) 40 min    Row Name 01/18/23 1400     Education   Cardiac Education Topics Pritikin   Hospital doctor Education   General Education Hypertension and Heart Disease   Instruction Review Code 1- Verbalizes Understanding   Class Start Time 1410   Class Stop Time 1445   Class Time Calculation (min) 35 min    Row Name 01/21/23 1300     Education   Cardiac Education Topics Pritikin   Geographical information systems officer Psychosocial   Psychosocial Workshop From Head to Heart: The Power of a Healthy Outlook   Instruction Review Code 1- Verbalizes Understanding   Class Start Time 1400   Class Stop Time 1446   Class Time Calculation (min) 46 min    Row Name 01/23/23 1500     Education   Cardiac Education Topics Pritikin   Customer service manager   Weekly Topic Adding Flavor - Sodium-Free   Instruction Review Code 1- Verbalizes Understanding   Class Start Time 1400   Class Stop Time 1437   Class Time Calculation (min) 37 min    Row Name 01/25/23 1400     Education   Cardiac Education Topics Pritikin   Hospital doctor Education   General Education Heart Disease Risk Reduction   Instruction Review Code 1- Verbalizes Understanding   Class Start Time 1355   Class Stop Time 1436   Class Time Calculation (min) 41 min    Row Name 01/28/23 1500     Education   Cardiac Education Topics Pritikin   Licensed conveyancer Nutrition   Nutrition Overview of the Pritikin Eating Plan   Instruction Review Code 1- Verbalizes Understanding   Class Start Time  1400   Class Stop Time 1452   Class Time Calculation (min) 52 min            Core Videos: Exercise    Move It!  Clinical staff conducted group or individual video education with verbal and written material and guidebook.  Patient learns the recommended Pritikin exercise program. Exercise with the goal of living a long, healthy life. Some of the health benefits of exercise include controlled diabetes, healthier blood pressure levels, improved cholesterol levels, improved heart and lung capacity, improved sleep, and better body composition. Everyone should speak with their doctor before starting or changing an exercise routine.  Biomechanical Limitations Clinical staff conducted group or individual video education with verbal and written material and guidebook.  Patient learns how biomechanical limitations can impact exercise and how we can mitigate and possibly overcome limitations to have an impactful and balanced exercise routine.  Body Composition Clinical staff conducted group or individual video education with verbal and written material and guidebook.  Patient learns that body composition (ratio of muscle mass to fat mass) is a key component to assessing overall fitness, rather than body weight alone. Increased fat mass, especially  visceral belly fat, can put [place] at increased risk for metabolic syndrome, type 2 diabetes, heart disease, and even death. It is recommended to combine diet and exercise (cardiovascular and resistance training) to improve your body composition. Seek guidance from your physician and exercise physiologist before implementing an exercise routine.  Exercise Action Plan Clinical staff conducted group or individual video education with verbal and written material and guidebook.  Patient learns the recommended strategies to achieve and enjoy long-term exercise adherence, including variety, self-motivation, self-efficacy, and positive decision making. Benefits of  exercise include fitness, good health, weight management, more energy, better sleep, less stress, and overall well-being.  Medical   Heart Disease Risk Reduction Clinical staff conducted group or individual video education with verbal and written material and guidebook.  Patient learns our heart is our most vital organ as it circulates oxygen, nutrients, white blood cells, and hormones throughout the entire body, and carries waste away. Data supports a plant-based eating plan like the Pritikin Program for its effectiveness in slowing progression of and reversing heart disease. The video provides a number of recommendations to address heart disease.   Metabolic Syndrome and Belly Fat  Clinical staff conducted group or individual video education with verbal and written material and guidebook.  Patient learns what metabolic syndrome is, how it leads to heart disease, and how one can reverse it and keep it from coming back. You have metabolic syndrome if you have 3 of the following 5 criteria: abdominal obesity, high blood pressure, high triglycerides, low HDL cholesterol, and high blood sugar.  Hypertension and Heart Disease Clinical staff conducted group or individual video education with verbal and written material and guidebook.  Patient learns that high blood pressure, or hypertension, is very common in the [place]. Hypertension is largely due to excessive salt intake, but other important risk factors include being overweight, physical inactivity, drinking too much alcohol, smoking, and not eating enough potassium from fruits and vegetables. High blood pressure is a leading risk factor for heart attack, stroke, congestive heart failure, dementia, kidney failure, and premature death. Long-term effects of excessive salt intake include stiffening of the arteries and thickening of heart muscle and organ damage. Recommendations include ways to reduce hypertension and the risk of heart  disease.  Diseases of Our Time - Focusing on Diabetes Clinical staff conducted group or individual video education with verbal and written material and guidebook.  Patient learns why the best way to stop diseases of our time is prevention, through food and other lifestyle changes. Medicine (such as prescription pills and surgeries) is often only a Band-Aid on the problem, not a long-term solution. Most common diseases of our time include obesity, type 2 diabetes, hypertension, heart disease, and cancer. The Pritikin Program is recommended and has been proven to help reduce, reverse, and/or prevent the damaging effects of metabolic syndrome.  Nutrition   Overview of the Pritikin Eating Plan  Clinical staff conducted group or individual video education with verbal and written material and guidebook.  Patient learns about the Pritikin Eating Plan for disease risk reduction. The Pritikin Eating Plan emphasizes a wide variety of unrefined, minimally-processed carbohydrates, like fruits, vegetables, whole grains, and legumes. Go, Caution, and Stop food choices are explained. Plant-based and lean animal proteins are emphasized. Rationale provided for low sodium intake for blood pressure control, low added sugars for blood sugar stabilization, and low added fats and oils for coronary artery disease risk reduction and weight management.  Calorie Density  Clinical staff conducted  group or individual video education with verbal and written material and guidebook.  Patient learns about calorie density and how it impacts the Pritikin Eating Plan. Knowing the characteristics of the food you choose will help you decide whether those foods will lead to weight gain or weight loss, and whether you want to consume more or less of them. Weight loss is usually a side effect of the Pritikin Eating Plan because of its focus on low calorie-dense foods.  Label Reading  Clinical staff conducted group or individual video  education with verbal and written material and guidebook.  Patient learns about the Pritikin recommended label reading guidelines and corresponding recommendations regarding calorie density, added sugars, sodium content, and whole grains.  Dining Out - Part 1  Clinical staff conducted group or individual video education with verbal and written material and guidebook.  Patient learns that restaurant meals can be sabotaging because they can be so high in calories, fat, sodium, and/or sugar. Patient learns recommended strategies on how to positively address this and avoid unhealthy pitfalls.  Facts on Fats  Clinical staff conducted group or individual video education with verbal and written material and guidebook.  Patient learns that lifestyle modifications can be just as effective, if not more so, as many medications for lowering your risk of heart disease. A Pritikin lifestyle can help to reduce your risk of inflammation and atherosclerosis (cholesterol build-up, or plaque, in the artery walls). Lifestyle interventions such as dietary choices and physical activity address the cause of atherosclerosis. A review of the types of fats and their impact on blood cholesterol levels, along with dietary recommendations to reduce fat intake is also included.  Nutrition Action Plan  Clinical staff conducted group or individual video education with verbal and written material and guidebook.  Patient learns how to incorporate Pritikin recommendations into their lifestyle. Recommendations include planning and keeping personal health goals in mind as an important part of their success.  Healthy Mind-Set    Healthy Minds, Bodies, Hearts  Clinical staff conducted group or individual video education with verbal and written material and guidebook.  Patient learns how to identify when they are stressed. Video will discuss the impact of that stress, as well as the many benefits of stress management. Patient will also  be introduced to stress management techniques. The way we think, act, and feel has an impact on our hearts.  How Our Thoughts Can Heal Our Hearts  Clinical staff conducted group or individual video education with verbal and written material and guidebook.  Patient learns that negative thoughts can cause depression and anxiety. This can result in negative lifestyle behavior and serious health problems. Cognitive behavioral therapy is an effective method to help control our thoughts in order to change and improve our emotional outlook.  Additional Videos:  Exercise    Improving Performance  Clinical staff conducted group or individual video education with verbal and written material and guidebook.  Patient learns to use a non-linear approach by alternating intensity levels and lengths of time spent exercising to help burn more calories and lose more body fat. Cardiovascular exercise helps improve heart health, metabolism, hormonal balance, blood sugar control, and recovery from fatigue. Resistance training improves strength, endurance, balance, coordination, reaction time, metabolism, and muscle mass. Flexibility exercise improves circulation, posture, and balance. Seek guidance from your physician and exercise physiologist before implementing an exercise routine and learn your capabilities and proper form for all exercise.  Introduction to Yoga  Clinical staff conducted group or individual  video education with verbal and written material and guidebook.  Patient learns about yoga, a discipline of the coming together of mind, breath, and body. The benefits of yoga include improved flexibility, improved range of motion, better posture and core strength, increased lung function, weight loss, and positive self-image. Yoga's heart health benefits include lowered blood pressure, healthier heart rate, decreased cholesterol and triglyceride levels, improved immune function, and reduced stress. Seek guidance  from your physician and exercise physiologist before implementing an exercise routine and learn your capabilities and proper form for all exercise.  Medical   Aging: Enhancing Your Quality of Life  Clinical staff conducted group or individual video education with verbal and written material and guidebook.  Patient learns key strategies and recommendations to stay in good physical health and enhance quality of life, such as prevention strategies, having an advocate, securing a Health Care Proxy and Power of Attorney, and keeping a list of medications and system for tracking them. It also discusses how to avoid risk for bone loss.  Biology of Weight Control  Clinical staff conducted group or individual video education with verbal and written material and guidebook.  Patient learns that weight gain occurs because we consume more calories than we burn (eating more, moving less). Even if your body weight is normal, you may have higher ratios of fat compared to muscle mass. Too much body fat puts you at increased risk for cardiovascular disease, heart attack, stroke, type 2 diabetes, and obesity-related cancers. In addition to exercise, following the Pritikin Eating Plan can help reduce your risk.  Decoding Lab Results  Clinical staff conducted group or individual video education with verbal and written material and guidebook.  Patient learns that lab test reflects one measurement whose values change over time and are influenced by many factors, including medication, stress, sleep, exercise, food, hydration, pre-existing medical conditions, and more. It is recommended to use the knowledge from this video to become more involved with your lab results and evaluate your numbers to speak with your doctor.   Diseases of Our Time - Overview  Clinical staff conducted group or individual video education with verbal and written material and guidebook.  Patient learns that according to the CDC, 50% to 70% of  chronic diseases (such as obesity, type 2 diabetes, elevated lipids, hypertension, and heart disease) are avoidable through lifestyle improvements including healthier food choices, listening to satiety cues, and increased physical activity.  Sleep Disorders Clinical staff conducted group or individual video education with verbal and written material and guidebook.  Patient learns how good quality and duration of sleep are important to overall health and well-being. Patient also learns about sleep disorders and how they impact health along with recommendations to address them, including discussing with a physician.  Nutrition  Dining Out - Part 2 Clinical staff conducted group or individual video education with verbal and written material and guidebook.  Patient learns how to plan ahead and communicate in order to maximize their dining experience in a healthy and nutritious manner. Included are recommended food choices based on the type of restaurant the patient is visiting.   Fueling a Banker conducted group or individual video education with verbal and written material and guidebook.  There is a strong connection between our food choices and our health. Diseases like obesity and type 2 diabetes are very prevalent and are in large-part due to lifestyle choices. The Pritikin Eating Plan provides plenty of food and hunger-curbing satisfaction. It is easy  to follow, affordable, and helps reduce health risks.  Menu Workshop  Clinical staff conducted group or individual video education with verbal and written material and guidebook.  Patient learns that restaurant meals can sabotage health goals because they are often packed with calories, fat, sodium, and sugar. Recommendations include strategies to plan ahead and to communicate with the manager, chef, or server to help order a healthier meal.  Planning Your Eating Strategy  Clinical staff conducted group or individual video  education with verbal and written material and guidebook.  Patient learns about the Pritikin Eating Plan and its benefit of reducing the risk of disease. The Pritikin Eating Plan does not focus on calories. Instead, it emphasizes high-quality, nutrient-rich foods. By knowing the characteristics of the foods, we choose, we can determine their calorie density and make informed decisions.  Targeting Your Nutrition Priorities  Clinical staff conducted group or individual video education with verbal and written material and guidebook.  Patient learns that lifestyle habits have a tremendous impact on disease risk and progression. This video provides eating and physical activity recommendations based on your personal health goals, such as reducing LDL cholesterol, losing weight, preventing or controlling type 2 diabetes, and reducing high blood pressure.  Vitamins and Minerals  Clinical staff conducted group or individual video education with verbal and written material and guidebook.  Patient learns different ways to obtain key vitamins and minerals, including through a recommended healthy diet. It is important to discuss all supplements you take with your doctor.   Healthy Mind-Set    Smoking Cessation  Clinical staff conducted group or individual video education with verbal and written material and guidebook.  Patient learns that cigarette smoking and tobacco addiction pose a serious health risk which affects millions of people. Stopping smoking will significantly reduce the risk of heart disease, lung disease, and many forms of cancer. Recommended strategies for quitting are covered, including working with your doctor to develop a successful plan.  Culinary   Becoming a Set designer conducted group or individual video education with verbal and written material and guidebook.  Patient learns that cooking at home can be healthy, cost-effective, quick, and puts them in control. Keys to  cooking healthy recipes will include looking at your recipe, assessing your equipment needs, planning ahead, making it simple, choosing cost-effective seasonal ingredients, and limiting the use of added fats, salts, and sugars.  Cooking - Breakfast and Snacks  Clinical staff conducted group or individual video education with verbal and written material and guidebook.  Patient learns how important breakfast is to satiety and nutrition through the entire day. Recommendations include key foods to eat during breakfast to help stabilize blood sugar levels and to prevent overeating at meals later in the day. Planning ahead is also a key component.  Cooking - Educational psychologist conducted group or individual video education with verbal and written material and guidebook.  Patient learns eating strategies to improve overall health, including an approach to cook more at home. Recommendations include thinking of animal protein as a side on your plate rather than center stage and focusing instead on lower calorie dense options like vegetables, fruits, whole grains, and plant-based proteins, such as beans. Making sauces in large quantities to freeze for later and leaving the skin on your vegetables are also recommended to maximize your experience.  Cooking - Healthy Salads and Dressing Clinical staff conducted group or individual video education with verbal and written material and guidebook.  Patient learns that vegetables, fruits, whole grains, and legumes are the foundations of the Pritikin Eating Plan. Recommendations include how to incorporate each of these in flavorful and healthy salads, and how to create homemade salad dressings. Proper handling of ingredients is also covered. Cooking - Soups and [employer] - Soups and Desserts Clinical staff conducted group or individual video education with verbal and written material and guidebook.  Patient learns that Pritikin soups and desserts  make for easy, nutritious, and delicious snacks and meal components that are low in sodium, fat, sugar, and calorie density, while high in vitamins, minerals, and filling fiber. Recommendations include simple and healthy ideas for soups and desserts.   Overview     The Pritikin Solution Program Overview Clinical staff conducted group or individual video education with verbal and written material and guidebook.  Patient learns that the results of the Pritikin Program have been documented in more than 100 articles published in peer-reviewed journals, and the benefits include reducing risk factors for (and, in some cases, even reversing) high cholesterol, high blood pressure, type 2 diabetes, obesity, and more! An overview of the three key pillars of the Pritikin Program will be covered: eating well, doing regular exercise, and having a healthy mind-set.  WORKSHOPS  Exercise: Exercise Basics: Building Your Action Plan Clinical staff led group instruction and group discussion with PowerPoint presentation and patient guidebook. To enhance the learning environment the use of posters, models and videos may be added. At the conclusion of this workshop, patients will comprehend the difference between physical activity and exercise, as well as the benefits of incorporating both, into their routine. Patients will understand the FITT (Frequency, Intensity, Time, and Type) principle and how to use it to build an exercise action plan. In addition, safety concerns and other considerations for exercise and cardiac rehab will be addressed by the presenter. The purpose of this lesson is to promote a comprehensive and effective weekly exercise routine in order to improve patients' overall level of fitness.   Managing Heart Disease: Your Path to a Healthier Heart Clinical staff led group instruction and group discussion with PowerPoint presentation and patient guidebook. To enhance the learning environment the use  of posters, models and videos may be added.At the conclusion of this workshop, patients will understand the anatomy and physiology of the heart. Additionally, they will understand how Pritikin's three pillars impact the risk factors, the progression, and the management of heart disease.  The purpose of this lesson is to provide a high-level overview of the heart, heart disease, and how the Pritikin lifestyle positively impacts risk factors.  Exercise Biomechanics Clinical staff led group instruction and group discussion with PowerPoint presentation and patient guidebook. To enhance the learning environment the use of posters, models and videos may be added. Patients will learn how the structural parts of their bodies function and how these functions impact their daily activities, movement, and exercise. Patients will learn how to promote a neutral spine, learn how to manage pain, and identify ways to improve their physical movement in order to promote healthy living. The purpose of this lesson is to expose patients to common physical limitations that impact physical activity. Participants will learn practical ways to adapt and manage aches and pains, and to minimize their effect on regular exercise. Patients will learn how to maintain good posture while sitting, walking, and lifting.  Balance Training and Fall Prevention  Clinical staff led group instruction and group discussion with PowerPoint presentation and patient guidebook.  To enhance the learning environment the use of posters, models and videos may be added. At the conclusion of this workshop, patients will understand the importance of their sensorimotor skills (vision, proprioception, and the vestibular system) in maintaining their ability to balance as they age. Patients will apply a variety of balancing exercises that are appropriate for their current level of function. Patients will understand the common causes for poor balance,  possible solutions to these problems, and ways to modify their physical environment in order to minimize their fall risk. The purpose of this lesson is to teach patients about the importance of maintaining balance as they age and ways to minimize their risk of falling.  WORKSHOPS   Nutrition:  Fueling a Ship broker led group instruction and group discussion with PowerPoint presentation and patient guidebook. To enhance the learning environment the use of posters, models and videos may be added. Patients will review the foundational principles of the Pritikin Eating Plan and understand what constitutes a serving size in each of the food groups. Patients will also learn Pritikin-friendly foods that are better choices when away from home and review make-ahead meal and snack options. Calorie density will be reviewed and applied to three nutrition priorities: weight maintenance, weight loss, and weight gain. The purpose of this lesson is to reinforce (in a group setting) the key concepts around what patients are recommended to eat and how to apply these guidelines when away from home by planning and selecting Pritikin-friendly options. Patients will understand how calorie density may be adjusted for different weight management goals.  Mindful Eating  Clinical staff led group instruction and group discussion with PowerPoint presentation and patient guidebook. To enhance the learning environment the use of posters, models and videos may be added. Patients will briefly review the concepts of the Pritikin Eating Plan and the importance of low-calorie dense foods. The concept of mindful eating will be introduced as well as the importance of paying attention to internal hunger signals. Triggers for non-hunger eating and techniques for dealing with triggers will be explored. The purpose of this lesson is to provide patients with the opportunity to review the basic principles of the Pritikin Eating  Plan, discuss the value of eating mindfully and how to measure internal cues of hunger and fullness using the Hunger Scale. Patients will also discuss reasons for non-hunger eating and learn strategies to use for controlling emotional eating.  Targeting Your Nutrition Priorities Clinical staff led group instruction and group discussion with PowerPoint presentation and patient guidebook. To enhance the learning environment the use of posters, models and videos may be added. Patients will learn how to determine their genetic susceptibility to disease by reviewing their family history. Patients will gain insight into the importance of diet as part of an overall healthy lifestyle in mitigating the impact of genetics and other environmental insults. The purpose of this lesson is to provide patients with the opportunity to assess their personal nutrition priorities by looking at their family history, their own health history and current risk factors. Patients will also be able to discuss ways of prioritizing and modifying the Pritikin Eating Plan for their highest risk areas  Menu  Clinical staff led group instruction and group discussion with PowerPoint presentation and patient guidebook. To enhance the learning environment the use of posters, models and videos may be added. Using menus brought in from [employer], or printed from [employer], patients will apply the Pritikin dining out guidelines that were presented in  the [employer] video. Patients will also be able to practice these guidelines in a variety of provided scenarios. The purpose of this lesson is to provide patients with the opportunity to practice hands-on learning of the Pritikin Dining Out guidelines with actual menus and practice scenarios.  Label Reading Clinical staff led group instruction and group discussion with PowerPoint presentation and patient guidebook. To enhance the learning environment the use of  posters, models and videos may be added. Patients will review and discuss the Pritikin label reading guidelines presented in Pritikin's Label Reading Educational series video. Using fool labels brought in from local grocery stores and markets, patients will apply the label reading guidelines and determine if the packaged food meet the Pritikin guidelines. The purpose of this lesson is to provide patients with the opportunity to review, discuss, and practice hands-on learning of the Pritikin Label Reading guidelines with actual packaged food labels. Cooking School  Pritikin's [employer] are designed to teach patients ways to prepare quick, simple, and affordable recipes at home. The importance of nutrition's role in chronic disease risk reduction is reflected in its emphasis in the overall Pritikin program. By learning how to prepare essential core Pritikin Eating Plan recipes, patients will increase control over what they eat; be able to customize the flavor of foods without the use of added salt, sugar, or fat; and improve the quality of the food they consume. By learning a set of core recipes which are easily assembled, quickly prepared, and affordable, patients are more likely to prepare more healthy foods at home. These workshops focus on convenient breakfasts, simple entres, side dishes, and desserts which can be prepared with minimal effort and are consistent with nutrition recommendations for cardiovascular risk reduction. Cooking [employer] are taught by a Armed forces logistics/support/administrative officer (RD) who has been trained by the [employer]. The chef or RD has a clear understanding of the importance of minimizing - if not completely eliminating - added fat, sugar, and sodium in recipes. Throughout the series of Cooking School Workshop sessions, patients will learn about healthy ingredients and efficient methods of cooking to build confidence in their capability to  prepare    Cooking School weekly topics:  Adding Flavor- Sodium-Free  Fast and Healthy Breakfasts  Powerhouse Plant-Based Proteins  Satisfying Salads and Dressings  Simple Sides and Sauces  International Cuisine-Spotlight on the [employer] Zones  Delicious Desserts  Savory Soups  [employer] - Meals in a Astronomer Appetizers and Snacks  Comforting Weekend Breakfasts  One-Pot Wonders   Fast Evening Meals  Landscape architect Your Pritikin Plate  WORKSHOPS   Healthy Mindset (Psychosocial):  Focused Goals, Sustainable Changes Clinical staff led group instruction and group discussion with PowerPoint presentation and patient guidebook. To enhance the learning environment the use of posters, models and videos may be added. Patients will be able to apply effective goal setting strategies to establish at least one personal goal, and then take consistent, meaningful action toward that goal. They will learn to identify common barriers to achieving personal goals and develop strategies to overcome them. Patients will also gain an understanding of how our mind-set can impact our ability to achieve goals and the importance of cultivating a positive and growth-oriented mind-set. The purpose of this lesson is to provide patients with a deeper understanding of how to set and achieve personal goals, as well as the tools and strategies needed to overcome common obstacles which may arise along  the way.  From Head to Heart: The Power of a Healthy Outlook  Clinical staff led group instruction and group discussion with PowerPoint presentation and patient guidebook. To enhance the learning environment the use of posters, models and videos may be added. Patients will be able to recognize and describe the impact of emotions and mood on physical health. They will discover the importance of self-care and explore self-care practices which may work for them. Patients will also learn how to utilize  the 4 C's to cultivate a healthier outlook and better manage stress and challenges. The purpose of this lesson is to demonstrate to patients how a healthy outlook is an essential part of maintaining good health, especially as they continue their cardiac rehab journey.  Healthy Sleep for a Healthy Heart Clinical staff led group instruction and group discussion with PowerPoint presentation and patient guidebook. To enhance the learning environment the use of posters, models and videos may be added. At the conclusion of this workshop, patients will be able to demonstrate knowledge of the importance of sleep to overall health, well-being, and quality of life. They will understand the symptoms of, and treatments for, common sleep disorders. Patients will also be able to identify daytime and nighttime behaviors which impact sleep, and they will be able to apply these tools to help manage sleep-related challenges. The purpose of this lesson is to provide patients with a general overview of sleep and outline the importance of quality sleep. Patients will learn about a few of the most common sleep disorders. Patients will also be introduced to the concept of "sleep hygiene," and discover ways to self-manage certain sleeping problems through simple daily behavior changes. Finally, the workshop will motivate patients by clarifying the links between quality sleep and their goals of heart-healthy living.   Recognizing and Reducing Stress Clinical staff led group instruction and group discussion with PowerPoint presentation and patient guidebook. To enhance the learning environment the use of posters, models and videos may be added. At the conclusion of this workshop, patients will be able to understand the types of stress reactions, differentiate between acute and chronic stress, and recognize the impact that chronic stress has on their health. They will also be able to apply different coping mechanisms, such as reframing  negative self-talk. Patients will have the opportunity to practice a variety of stress management techniques, such as deep abdominal breathing, progressive muscle relaxation, and/or guided imagery.  The purpose of this lesson is to educate patients on the role of stress in their lives and to provide healthy techniques for coping with it.  Learning Barriers/Preferences:  Learning Barriers/Preferences - 11/29/22 1227       Learning Barriers/Preferences   Learning Barriers Sight;Hearing   wears glasses/ hearing loss left ear   Learning Preferences Skilled Demonstration;Individual Instruction;Computer/Internet;Video;Pictoral             Education Topics:  Knowledge Questionnaire Score:  Knowledge Questionnaire Score - 11/29/22 1352       Knowledge Questionnaire Score   Pre Score 20/24             Core Components/Risk Factors/Patient Goals at Admission:  Personal Goals and Risk Factors at Admission - 11/29/22 1234       Core Components/Risk Factors/Patient Goals on Admission   Hypertension Yes    Intervention Provide education on lifestyle modifcations including regular physical activity/exercise, weight management, moderate sodium restriction and increased consumption of fresh fruit, vegetables, and low fat dairy, alcohol moderation, and smoking cessation.;Monitor prescription use  compliance.    Expected Outcomes Short Term: Continued assessment and intervention until BP is < 140/82mm HG in hypertensive participants. < 130/26mm HG in hypertensive participants with diabetes, heart failure or chronic kidney disease.;Long Term: Maintenance of blood pressure at goal levels.    Lipids Yes    Intervention Provide education and support for participant on nutrition & aerobic/resistive exercise along with prescribed medications to achieve LDL 70mg , HDL >40mg .    Expected Outcomes Short Term: Participant states understanding of desired cholesterol values and is compliant with medications  prescribed. Participant is following exercise prescription and nutrition guidelines.;Long Term: Cholesterol controlled with medications as prescribed, with individualized exercise RX and with personalized nutrition plan. Value goals: LDL < 70mg , HDL > 40 mg.             Core Components/Risk Factors/Patient Goals Review:   Goals and Risk Factor Review     Row Name 01/29/23 1632             Core Components/Risk Factors/Patient Goals Review   Personal Goals Review Weight Management/Obesity;Hypertension;Lipids       Review Theodore Patton is doing well with exercise at cardiac rehab. Vital signs have been stalbe. Theodore Patton has lost 2.4 since startting cardiac rehab and reports feeling much stonger       Expected Outcomes Theodore Patton will continue to participate in cardiac rehab for exercise, nutrition and lifestyle modifications                Core Components/Risk Factors/Patient Goals at Discharge (Final Review):   Goals and Risk Factor Review - 01/29/23 1632       Core Components/Risk Factors/Patient Goals Review   Personal Goals Review Weight Management/Obesity;Hypertension;Lipids    Review Theodore Patton is doing well with exercise at cardiac rehab. Vital signs have been stalbe. Theodore Patton has lost 2.4 since startting cardiac rehab and reports feeling much stonger    Expected Outcomes Theodore Patton will continue to participate in cardiac rehab for exercise, nutrition and lifestyle modifications             ITP Comments:  ITP Comments     Row Name 11/29/22 1045 12/05/22 1631 01/01/23 1630 01/29/23 1628     ITP Comments Armanda Magic, MD:  Medical Director. Introduction to the [employer] / Intensive Cardiac Rehab.  Initial orientation packet reviewed with the patient. 30 Day ITP Review. Theodore Patton started cardiac rehab on 12/05/22 and did well with exercise 30 Day ITP Review. Theodore Patton has good attendance and participation in cardiac rehab 30 Day ITP Review. Theodore Patton  has good attendance and participation in cardiac  rehab. Theodore Patton will complete cardiac rehab on 03/01/23             Comments: See ITP comments.Thayer Headings RN BSN

## 2023-01-30 ENCOUNTER — Encounter (HOSPITAL_COMMUNITY): Admission: RE | Admit: 2023-01-30 | Payer: Medicare Other | Source: Ambulatory Visit

## 2023-01-30 DIAGNOSIS — Z951 Presence of aortocoronary bypass graft: Secondary | ICD-10-CM

## 2023-01-30 DIAGNOSIS — Z952 Presence of prosthetic heart valve: Secondary | ICD-10-CM

## 2023-01-31 ENCOUNTER — Encounter: Payer: Self-pay | Admitting: Cardiology

## 2023-01-31 ENCOUNTER — Ambulatory Visit: Payer: Medicare Other | Attending: Cardiology | Admitting: Cardiology

## 2023-01-31 VITALS — BP 98/60 | HR 64 | Ht 71.75 in | Wt 184.2 lb

## 2023-01-31 DIAGNOSIS — I10 Essential (primary) hypertension: Secondary | ICD-10-CM

## 2023-01-31 DIAGNOSIS — I9789 Other postprocedural complications and disorders of the circulatory system, not elsewhere classified: Secondary | ICD-10-CM

## 2023-01-31 DIAGNOSIS — I77810 Thoracic aortic ectasia: Secondary | ICD-10-CM

## 2023-01-31 DIAGNOSIS — Z952 Presence of prosthetic heart valve: Secondary | ICD-10-CM | POA: Diagnosis not present

## 2023-01-31 DIAGNOSIS — E785 Hyperlipidemia, unspecified: Secondary | ICD-10-CM | POA: Diagnosis not present

## 2023-01-31 DIAGNOSIS — I2510 Atherosclerotic heart disease of native coronary artery without angina pectoris: Secondary | ICD-10-CM

## 2023-01-31 DIAGNOSIS — I4891 Unspecified atrial fibrillation: Secondary | ICD-10-CM

## 2023-01-31 NOTE — Addendum Note (Signed)
Addended by: Luellen Pucker on: 01/31/2023 09:37 AM   Modules accepted: Orders

## 2023-01-31 NOTE — Patient Instructions (Signed)
Medication Instructions:  Your physician recommends that you continue on your current medications as directed. Please refer to the Current Medication list given to you today.  *If you need a refill on your cardiac medications before your next appointment, please call your pharmacy*   Lab Work: None.  If you have labs (blood work) drawn today and your tests are completely normal, you will receive your results only by: MyChart Message (if you have MyChart) OR A paper copy in the mail If you have any lab test that is abnormal or we need to change your treatment, we will call you to review the results.   Testing/Procedures: Your physician has requested that you have an echocardiogram at end of September 2024. Echocardiography is a painless test that uses sound waves to create images of your heart. It provides your doctor with information about the size and shape of your heart and how well your heart's chambers and valves are working. This procedure takes approximately one hour. There are no restrictions for this procedure. Please do NOT wear cologne, perfume, aftershave, or lotions (deodorant is allowed). Please arrive 15 minutes prior to your appointment time.    Follow-Up: At [facility], you and your health needs are our priority.  As part of our continuing mission to provide you with exceptional heart care, we have created designated Provider Care Teams.  These Care Teams include your primary Cardiologist (physician) and Advanced Practice Providers (APPs -  Physician Assistants and Nurse Practitioners) who all work together to provide you with the care you need, when you need it.  We recommend signing up for the patient portal called "MyChart".  Sign up information is provided on this After Visit Summary.  MyChart is used to connect with patients for Virtual Visits (Telemedicine).  Patients are able to view lab/test results, encounter notes, upcoming appointments, etc.  Non-urgent  messages can be sent to your provider as well.   To learn more about what you can do with MyChart, go to [email or web address].    Your next appointment:   6 month(s)  Provider:   Armanda Magic, MD

## 2023-01-31 NOTE — Progress Notes (Signed)
Date:  01/31/2023   ID:  Theodore Patton, DOB 1946/02/17, MRN [id number] The patient was identified using 2 identifiers. PCP:  Chilton Greathouse, MD   [facility] Providers Cardiologist:  Armanda Magic, MD     Evaluation Performed:  Follow-Up Visit  Chief Complaint: AI s/p AVR, CAD s/p CABG LIMA>LAD  History of Present Illness:    Theodore Patton is a 77 y.o. male with  a past medical history significant for bicuspid aortic valve with mild aortic stenosis  and mildly dilated ascending aorta at 39 mm.  He also has a history of GERD and arthritis.  He had 2D echo 01/20/2021 showed normal LV function with EF 60 to 65% with mild LVH, grade 1 diastolic dysfunction with trileaflet aortic valve and calcification of the right coronary cusp leading to mild miscoaptation and an eccentric jet of AI with moderate AI.  There was no evidence of aortic valve stenosis.  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  There was a highly eccentric ejection jet directed the anterior mitral valve leaflet with a vena contract of 6 mm and regurgitant volume 104 cc and regurgitant fraction 68% with ERO 0.56 cm and pressure half-time of AR 258.    Seen on 02/12/2022 for cardiac risk assessment for surgery.  TTE 01/02/2022 with LVEF 60 to 65%, grade 1 DD, restricted motion of the right coronary cusp.  Aortic valve is tricuspid, aortic valve regurgitation is moderate to severe, moderate dilatation of the aortic root measuring 43 mm, mild dilatation of the ascending aorta measuring 38 mm.  Plan for repeat echocardiogram in 6 months.   Repeat echocardiogram 07/26/2022 with LVEF 60 to 65%.  Severe AI and mildly dilated aortic root at 41 mm.  Plan for follow-up TEE to evaluate aortic insufficiency further.   He underwent TEE 08/24/22 which revealed normal LVEF 65 to 70%, no RWMA, normal RV, mildly  dilated LA with no LA/LAA appendage thrombus, mild MR, mild prolapse of the left coronary cusp and dilatation of the aortic annulus, aortic valve is tricuspid with mild calcification and mild thickening, AI is severe, AV sclerosis is present with no evidence of stenosis, mild dilatation of the aortic root measuring 41 mm mild (grade II) layered plaque involving the descending aorta He underwent cardiac catheterization on 09/28/2022 which revealed moderate to severe multivessel disease consisting of LAD, circumflex, and right coronary artery lesions.     Seen by Dr. Laneta Simmers, cardiac surgeon, on 10/10/22 with recommendation to undergo CABG and aortic valve replacement using a bioprosthetic valve.    [facility]2022 and underwent CABG x 1 utilizing LIMA to LAD and aortic valve replacement utilizing 27 mm Inspiris aortic valve as well as endoscopic harvest of the right greater saphenous vein which was too small to be used as a graft.  Repeat echo 7/24 showed  EF 45-50% with mid to apical septal and apica HK with improved EF from 40-45% in June with stable AVR and mean AVG and Vmax 1.20m/s.  Aortic root was 40mm and ascending aorta 41mm.  He is here today for followup and is doing well.  Since his surgery he has had problems with low BPs and has weaned off his Carvedilol with improvement.  His SBP at home now is in the low 100's but at home in the 120's.He denies any chest  pain or pressure, SOB, DOE, PND, orthopnea, LE edema, dizziness, palpitations or syncope. He is compliant with his meds and is tolerating meds with no SE.    Past Medical History:  Diagnosis Date   Aortic regurgitation 01/07/2019   severe and eccentric by TEE 32024 with RF 69%.   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, completely asymptomatic   Arthritis    Complication of anesthesia    Coronary artery calcification seen on CAT scan    GERD (gastroesophageal reflux  disease)    Gout    Heart murmur    Hyperlipemia    PONV (postoperative nausea and vomiting)    Primary localized osteoarthritis of right knee 04/14/2019   Primary localized osteoarthrosis of right shoulder 07/24/2016   Past Surgical History:  Procedure Laterality Date   AORTIC VALVE REPLACEMENT N/A 10/18/2022   Procedure: AORTIC VALVE REPLACEMENT (AVR) USING INSPIRIS AORTIC VALVE SIZE ;  Surgeon: Alleen Borne, MD;  Location: [facility] OR;  Service: Open Heart Surgery;  Laterality: N/A;   COLONOSCOPY     CORONARY ARTERY BYPASS GRAFT N/A 10/18/2022   Procedure: CORONARY ARTERY BYPASS GRAFTING (CABG) X 1 USING LEFT INTERNAL MAMMARY  ARTERY AND ENDOSCOPICALLY HARVESTED LEFT GREATER SAPHENOUS VEIN;  Surgeon: Alleen Borne, MD;  Location: [facility] OR;  Service: Open Heart Surgery;  Laterality: N/A;   PARTIAL KNEE ARTHROPLASTY Right 04/14/2019   Procedure: UNICOMPARTMENTAL KNEE;  Surgeon: Teryl Lucy, MD;  Location: [facility] ORS;  Service: Orthopedics;  Laterality: Right;   PILONIDAL CYST EXCISION     RIGHT/LEFT HEART CATH AND CORONARY ANGIOGRAPHY N/A 09/28/2022   Procedure: RIGHT/LEFT HEART CATH AND CORONARY ANGIOGRAPHY;  Surgeon: Orbie Pyo, MD;  Location: [facility] INVASIVE CV LAB;  Service: Cardiovascular;  Laterality: N/A;   SHOULDER ARTHROSCOPY Right 2008   TEE WITHOUT CARDIOVERSION N/A 08/24/2022   Procedure: TRANSESOPHAGEAL ECHOCARDIOGRAM (TEE);  Surgeon: Thurmon Fair, MD;  Location[facility] ENDOSCOPY;  Service: Cardiovascular;  Laterality: N/A;   TEE WITHOUT CARDIOVERSION N/A 10/18/2022   Procedure: TRANSESOPHAGEAL ECHOCARDIOGRAM;  Surgeon: Alleen Borne, MD;  Location: [facility] OR;  Service: Open Heart Surgery;  Laterality: N/A;   TOTAL SHOULDER ARTHROPLASTY Right 07/24/2016   Procedure: TOTAL SHOULDER ARTHROPLASTY;  Surgeon: Teryl Lucy, MD;  Location: [facility] OR;  Service: Orthopedics;  Laterality: Right;   TOTAL SHOULDER ARTHROPLASTY Left 03/06/2022   Procedure: TOTAL SHOULDER ARTHROPLASTY;  Surgeon:  Teryl Lucy, MD;  Location: [facility] ORS;  Service: Orthopedics;  Laterality: Left;   UPPER GI ENDOSCOPY       Current Meds  Medication Sig   acetaminophen (TYLENOL) 650 MG CR tablet Take 1,300 mg by mouth 2 (two) times daily.   allopurinol (ZYLOPRIM) 300 MG tablet Take 300 mg by mouth in the morning.   aspirin EC 81 MG tablet Take 1 tablet (81 mg total) by mouth daily. Swallow whole.   buPROPion (WELLBUTRIN XL) 150 MG 24 hr tablet Take 300 mg by mouth in the morning.   cetirizine (ZYRTEC) 10 MG tablet Take 10 mg by mouth at bedtime.   colchicine 0.6 MG tablet Take 0.6 mg by mouth every 6 (six) hours as needed (gout flares).    famotidine (PEPCID) 20 MG tablet Take 20 mg by mouth at bedtime.   fluticasone (FLONASE) 50 MCG/ACT nasal spray Place 1-2 sprays into both nostrils at bedtime as needed for allergies.   gabapentin (NEURONTIN) 300 MG capsule Take 600 mg by mouth 3 (three) times daily.  Glucosamine-Chondroitin (COSAMIN DS PO) Take 1 tablet by mouth 2 (two) times daily. Triple Strength   Multiple Vitamin (MULTIVITAMIN WITH MINERALS) TABS tablet Take 1 tablet by mouth daily. Complete Multivitamin   Probiotic Product (ALIGN) 4 MG CAPS Take 4 mg by mouth daily as needed (gut health).   rosuvastatin (CRESTOR) 10 MG tablet Take 10 mg by mouth in the morning.   SALINE NASAL MIST NA Place 1 spray into the nose 3 (three) times daily as needed (congestion).   tamsulosin (FLOMAX) 0.4 MG CAPS capsule Take 0.4 mg by mouth every evening.   TURMERIC PO Take 1,000 mg by mouth in the morning and at bedtime.     Allergies:   Sulfa antibiotics   Social History   Tobacco Use   Smoking status: Former    Current packs/day: 0.00    Types: Cigarettes    Quit date: 07/12/1980    Years since quitting: 42.5   Smokeless tobacco: Never  Vaping Use   Vaping status: Never Used  Substance Use Topics   Alcohol use: Yes    Alcohol/week: 1.0 standard drink of alcohol    Types: 1 Glasses of wine per week     Comment: occ   Drug use: No     Family Hx: The patient's family history includes Healthy in his sister; Heart Problems in his sister; Liver cancer in his brother.  ROS:   Please see the history of present illness.     All other systems reviewed and are negative.   Prior CV studies:   The following studies were reviewed today:  TEE 08/2022 IMPRESSIONS    1. Left ventricular ejection fraction, by estimation, is 65 to 70%. The  left ventricle has normal function. The left ventricle has no regional  wall motion abnormalities. The left ventricular internal cavity size was  mildly dilated.   2. Right ventricular systolic function is normal. The right ventricular  size is normal. Tricuspid regurgitation signal is inadequate for assessing  PA pressure.   3. Left atrial size was mildly dilated. No left atrial/left atrial  appendage thrombus was detected.   4. The mitral valve is normal in structure. Mild mitral valve  regurgitation.   5. 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 The aortic ejection jet is highly eccentric,  directed at the anterior mitral leaflet. The AR vena contracta is 6 mm.  The regurgitant volume is 104 ml,  regurgitant fraction is 68%. The 3D guided ERO area is 0.56 cm sq. The  pressure half time of the AR jet is 258 ms. There is holodiastolic  reversal of flow in the descending aorta. The aortic valve is tricuspid.  There is mild calcification of the aortic  valve. There is mild thickening of the aortic valve. Aortic valve  regurgitation is severe. Aortic valve sclerosis is present, with no  evidence of aortic valve stenosis.   6. The aortic annulus is dilated (3.2 cm) and the sinuses of Valsalva are  mildly dilated (4.1 cm) , but the remainder of the aorta is normal in  caliber. Aortic dilatation noted. There is mild dilatation of the aortic  root, measuring 41 mm. There is mild   (Grade II) layered  plaque involving the descending aorta.   Labs/Other Tests and Data Reviewed:    EKG:  No ECG reviewed.  Recent Labs: 10/20/2022: ALT 13 10/21/2022: Magnesium 2.2 10/23/2022: BUN 27; Creatinine, Ser 1.14; Potassium 3.8; Sodium  134 11/02/2022: Hemoglobin 9.7; Platelets 271   Recent Lipid Panel Lab Results  Component Value Date/Time   CHOL 90 (L) 11/02/2022 10:55 AM   TRIG 113 11/02/2022 10:55 AM   HDL 37 (L) 11/02/2022 10:55 AM   CHOLHDL 2.4 11/02/2022 10:55 AM   LDLCALC 32 11/02/2022 10:55 AM    Wt Readings from Last 3 Encounters:  01/31/23 184 lb 3.2 oz (83.6 kg)  11/29/22 190 lb 7.6 oz (86.4 kg)  11/28/22 189 lb (85.7 kg)     Risk Assessment/Calculations:          Objective:    Vital Signs:  BP 98/60 (BP Location: Left Arm, Patient Position: Sitting, Cuff Size: Large)   Pulse 64   Ht 5' 11.75" (1.822 m)   Wt 184 lb 3.2 oz (83.6 kg)   SpO2 94%   BMI 25.16 kg/m   GEN: Well nourished, well developed in no acute distress HEENT: Normal NECK: No JVD; No carotid bruits LYMPHATICS: No lymphadenopathy CARDIAC:RRR, no murmurs, rubs, gallops RESPIRATORY:  Clear to auscultation without rales, wheezing or rhonchi  ABDOMEN: Soft, non-tender, non-distended MUSCULOSKELETAL:  No edema; No deformity  SKIN: Warm and dry NEUROLOGIC:  Alert and oriented x 3 PSYCHIATRIC:  Normal affect   ASSESSMENT & PLAN:    Aortic valve disease -This was previously felt to be a bicuspid aortic valve and in the past mild AS  -Status post bioprosthetic AVR 10/22/2022 for severe AR  Aortic root and ascending aortic dilatation -Chest CTA 02/2021 showed 41mm -repeat Chest CTA 01/2022 demonstrated no aortic aneurysm.  The aorta at the sinus of Valsalva was 32 mm x 34 x 33 mm unchanged, STJ 35 mm unchanged, ascending aorta 36 mm unchanged and aortic arch 35 mm unchanged.   ASCAD -Cardiac cath 09/28/2022 for workup for AVR revealed proximal LAD lesion 20%, mid LAD-1 lesion 50% and mid LAD-2 lesion 50%,  mid Cx lesion 60% and proximal RCA lesion 70%.  -Status post CABG x 1 with LIMA to the LAD at the time of AVR -He denies any anginal symptoms -Continue prescription drug management with aspirin 81 mg daily and statin therapy with as needed refills  Hyperlipidemia -LDL goal less than 70 -I have personally reviewed and interpreted outside labs performed by patient's PCP which showed LDL 41 and HDL 34 on 12/31/2022 -Continue prescription drug management with rosuvastatin 10 mg daily with as needed refills  Postop atrial fibrillation -He was transiently on amiodarone postop but this is now been stopped -He denies any palpitations -Maintaining normal sinus rhythm on exam today  Hypertension -BP controlled on exam today>>soft in the upper 90's systolic but at home in the 120's -He had been having some problems post op with lightheadedness and we have had to slowly decrease his blood pressure medications and is now off BP meds -Ultimately his carvedilol was stopped -encouraged him to drink 64ox of fluids daily and avoid extreme heat where he could get dehydrated.   DCM -EF was normal prior to AVR -EF initially post op was 40-45% initially and improved to 45-50% on echo July 2024 -will repeat echo at end of Sept 2024 -Bp too soft for GDMT   Medication Adjustments/Labs and Tests Ordered: Current medicines are reviewed at length with the patient today.  Concerns regarding medicines are outlined above.   Tests Ordered: No orders of the defined types were placed in this encounter.   Medication Changes: No orders of the defined types were placed in this encounter.   Follow  Up:  In Personin 6 months  Signed, Armanda Magic, MD  01/31/2023 9:23 AM    [facility]

## 2023-02-01 ENCOUNTER — Encounter (HOSPITAL_COMMUNITY)
Admission: RE | Admit: 2023-02-01 | Discharge: 2023-02-01 | Disposition: A | Discharge status: Home / Self Care | Payer: Medicare Other | Source: Ambulatory Visit | Attending: Cardiology | Admitting: Cardiology

## 2023-02-01 DIAGNOSIS — Z952 Presence of prosthetic heart valve: Secondary | ICD-10-CM

## 2023-02-01 DIAGNOSIS — Z951 Presence of aortocoronary bypass graft: Secondary | ICD-10-CM

## 2023-02-06 ENCOUNTER — Encounter (HOSPITAL_COMMUNITY): Payer: Medicare Other

## 2023-02-08 ENCOUNTER — Encounter (HOSPITAL_COMMUNITY): Payer: Medicare Other

## 2023-02-11 ENCOUNTER — Encounter (HOSPITAL_COMMUNITY)
Admission: RE | Admit: 2023-02-11 | Discharge: 2023-02-11 | Disposition: A | Discharge status: Home / Self Care | Payer: Medicare Other | Source: Ambulatory Visit | Attending: Cardiology | Admitting: Cardiology

## 2023-02-11 DIAGNOSIS — Z952 Presence of prosthetic heart valve: Secondary | ICD-10-CM | POA: Diagnosis present

## 2023-02-11 DIAGNOSIS — Z951 Presence of aortocoronary bypass graft: Secondary | ICD-10-CM | POA: Diagnosis not present

## 2023-02-13 ENCOUNTER — Encounter (HOSPITAL_COMMUNITY)
Admission: RE | Admit: 2023-02-13 | Discharge: 2023-02-13 | Disposition: A | Discharge status: Home / Self Care | Payer: Medicare Other | Source: Ambulatory Visit | Attending: Cardiology | Admitting: Cardiology

## 2023-02-13 DIAGNOSIS — Z952 Presence of prosthetic heart valve: Secondary | ICD-10-CM

## 2023-02-13 DIAGNOSIS — Z951 Presence of aortocoronary bypass graft: Secondary | ICD-10-CM | POA: Diagnosis not present

## 2023-02-15 ENCOUNTER — Encounter (HOSPITAL_COMMUNITY)
Admission: RE | Admit: 2023-02-15 | Discharge: 2023-02-15 | Disposition: A | Discharge status: Home / Self Care | Payer: Medicare Other | Source: Ambulatory Visit | Attending: Cardiology

## 2023-02-15 DIAGNOSIS — Z951 Presence of aortocoronary bypass graft: Secondary | ICD-10-CM | POA: Diagnosis not present

## 2023-02-15 DIAGNOSIS — Z952 Presence of prosthetic heart valve: Secondary | ICD-10-CM

## 2023-02-18 ENCOUNTER — Encounter (HOSPITAL_COMMUNITY)
Admission: RE | Admit: 2023-02-18 | Discharge: 2023-02-18 | Disposition: A | Discharge status: Home / Self Care | Payer: Medicare Other | Source: Ambulatory Visit | Attending: Cardiology | Admitting: Cardiology

## 2023-02-18 DIAGNOSIS — Z951 Presence of aortocoronary bypass graft: Secondary | ICD-10-CM

## 2023-02-18 DIAGNOSIS — Z952 Presence of prosthetic heart valve: Secondary | ICD-10-CM

## 2023-02-20 ENCOUNTER — Encounter (HOSPITAL_COMMUNITY)
Admission: RE | Admit: 2023-02-20 | Discharge: 2023-02-20 | Disposition: A | Discharge status: Home / Self Care | Payer: Medicare Other | Source: Ambulatory Visit | Attending: Cardiology | Admitting: Cardiology

## 2023-02-20 DIAGNOSIS — Z951 Presence of aortocoronary bypass graft: Secondary | ICD-10-CM

## 2023-02-20 DIAGNOSIS — Z952 Presence of prosthetic heart valve: Secondary | ICD-10-CM

## 2023-02-22 ENCOUNTER — Encounter (HOSPITAL_COMMUNITY)
Admission: RE | Admit: 2023-02-22 | Discharge: 2023-02-22 | Disposition: A | Discharge status: Home / Self Care | Payer: Medicare Other | Source: Ambulatory Visit | Attending: Cardiology | Admitting: Cardiology

## 2023-02-22 DIAGNOSIS — Z951 Presence of aortocoronary bypass graft: Secondary | ICD-10-CM

## 2023-02-22 DIAGNOSIS — Z952 Presence of prosthetic heart valve: Secondary | ICD-10-CM

## 2023-02-25 ENCOUNTER — Encounter (HOSPITAL_COMMUNITY)
Admission: RE | Admit: 2023-02-25 | Discharge: 2023-02-25 | Disposition: A | Discharge status: Home / Self Care | Payer: Medicare Other | Source: Ambulatory Visit | Attending: Cardiology

## 2023-02-25 DIAGNOSIS — Z951 Presence of aortocoronary bypass graft: Secondary | ICD-10-CM | POA: Diagnosis not present

## 2023-02-25 DIAGNOSIS — Z952 Presence of prosthetic heart valve: Secondary | ICD-10-CM

## 2023-02-26 NOTE — Progress Notes (Signed)
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 Discontinue once prescription runs out (Patient not taking: Reported on 01/31/2023), Disp: , Rfl:    aspirin EC 81 MG tablet, Take 1 tablet (81 mg total) by mouth daily. Swallow whole., Disp: 150 tablet, Rfl: 2   buPROPion (WELLBUTRIN XL) 150 MG 24 hr tablet, Take 300 mg by mouth in the morning., Disp: , Rfl:    cetirizine (ZYRTEC) 10 MG tablet, Take 10 mg by mouth at bedtime., Disp: , Rfl:    colchicine 0.6 MG tablet, Take 0.6 mg by mouth every 6 (six) hours as needed (gout flares). , Disp: , Rfl:    famotidine (PEPCID) 20 MG tablet, Take 20 mg by mouth at bedtime., Disp: , Rfl:    Fe Fum-Vit C-Vit B12-FA (TRIGELS-F FORTE) CAPS capsule, Take 1 capsule by mouth daily after breakfast. May substitute with similar generic medication (Patient not taking: Reported on 01/31/2023), Disp: 30 capsule, Rfl: 0   fluticasone (FLONASE) 50 MCG/ACT nasal spray, Place 1-2 sprays into both nostrils at bedtime as needed for allergies., Disp: , Rfl:    gabapentin (NEURONTIN) 300 MG capsule, Take 600 mg by  mouth 3 (three) times daily., Disp: , Rfl:    Glucosamine-Chondroitin (COSAMIN DS PO), Take 1 tablet by mouth 2 (two) times daily. Triple Strength, Disp: , Rfl:    Multiple Vitamin (MULTIVITAMIN WITH MINERALS) TABS tablet, Take 1 tablet by mouth daily. Complete Multivitamin, Disp: , Rfl:    Probiotic Product (ALIGN) 4 MG CAPS, Take 4 mg by mouth daily as needed (gut health)., Disp: , Rfl:    rosuvastatin (CRESTOR) 10 MG tablet, Take 10 mg by mouth in the morning., Disp: , Rfl:    SALINE NASAL MIST NA, Place 1 spray into the nose 3 (three) times daily as needed (congestion)., Disp: , Rfl:    tamsulosin (FLOMAX) 0.4 MG CAPS capsule, Take 0.4 mg by mouth every evening., Disp: , Rfl:    TURMERIC PO, Take 1,000 mg by mouth in the morning and at bedtime., Disp: , Rfl:   Past Medical History: Past Medical History:  Diagnosis Date   Aortic regurgitation 01/07/2019   severe and eccentric by TEE [id number] with RF 69%.   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    Tobacco Use: Social History   Tobacco Use  Smoking Status Former   Current packs/day: 0.00   Types: Cigarettes   Quit date: 07/12/1980   Years since quitting: 42.6  Smokeless Tobacco Never    Labs: Review Flowsheet  More data exists      Latest Ref Rng & Units 09/28/2022 10/16/2022 10/18/2022 10/19/2022 11/02/2022  Labs for ITP Cardiac and Pulmonary Rehab  Cholestrol 100 - 199 mg/dL - - - - 90   LDL (calc) 0 - 99 mg/dL - - - - 32   HDL-C >40 mg/dL - - - - 37   Trlycerides 0 - 149 mg/dL - - - - 981   Hemoglobin A1c 4.8 -  5.6 % - 5.5  - - -  PH, Arterial 7.35 - 7.45 7.326  7.4  7.411  7.396  7.335  7.371  7.365  7.390  -  PCO2 arterial 32 - 48 mmHg 46.3  35  32.8  35.9  41.7  42.2  40.2  38.7  -  Bicarbonate 20.0 - 28.0 mmol/L 23.1  24.4  24.2  21.7  21.2  22.0  22.2  24.4  22.9  23.2  -  TCO2 22 - 32 mmol/L 24  26  26   - 22  22  23  24  24  23  26  26  27  24  24   -  Acid-base deficit 0.0 - 2.0 mmol/L 3.0  2.0  2.0  2.5  3.0  3.0  3.0  1.0  2.0  1.0  -  O2 Saturation % 67  69  93  98  97  99  100  100  99  99  -    Details       Multiple values from one day are sorted in reverse-chronological order         Capillary Blood Glucose: Lab Results  Component Value Date   GLUCAP 106 (H) 10/23/2022   GLUCAP 124 (H) 10/22/2022   GLUCAP 112 (H) 10/21/2022   GLUCAP 114 (H) 10/21/2022   GLUCAP 133 (H) 10/21/2022     Exercise Target Goals: Exercise Program Goal: Individual exercise prescription set using results from initial 6 min walk test and THRR while considering  patient's activity barriers and safety.   Exercise Prescription Goal: Initial exercise prescription builds to 30-45 minutes a day of aerobic activity, 2-3 days per week.  Home exercise guidelines will be given to patient during program as part of exercise prescription that the participant will acknowledge.  Activity Barriers & Risk Stratification:  Activity Barriers & Cardiac Risk Stratification - 11/29/22 1224       Activity Barriers & Cardiac Risk Stratification   Activity Barriers Arthritis;Back Problems;Right Knee Replacement;Joint Problems;Deconditioning;Muscular Weakness;Balance Concerns;Other (comment)    Comments Sternal Precautions    Cardiac Risk Stratification High             6 Minute Walk:  6 Minute Walk     Row Name 11/29/22 1142         6 Minute Walk   Phase Initial  Used Go-cart     Distance 1233 feet     Walk Time 6 minutes     # of Rest Breaks 0     MPH 2.34     METS 2.45     RPE 10  Perceived  Dyspnea  0     VO2 Peak 8.6     Symptoms No     Resting HR 75 bpm     Resting BP 116/80     Resting Oxygen Saturation  98 %     Exercise Oxygen Saturation  during 6 min walk 98 %     Max Ex. HR 90 bpm     Max Ex. BP 110/70     2 Minute Post BP 120/80              Oxygen Initial Assessment:   Oxygen Re-Evaluation:   Oxygen Discharge (Final Oxygen Re-Evaluation):   Initial Exercise Prescription:  Initial Exercise Prescription - 11/29/22 1200       Date of Initial Exercise RX and Referring Provider   Date 11/29/22    Referring Provider Armanda Magic, MD    Expected Discharge Date 02/08/23      NuStep   Level 1    SPM 80    Minutes 15    METs 2.45      Arm Ergometer   Level 1    Watts 25    RPM 60    Minutes 15    METs 2.45      Prescription Details   Frequency (times per week) 3    Duration Progress to 30 minutes of continuous aerobic without signs/symptoms of physical distress      Intensity   THRR 40-80% of Max Heartrate 58-116    Ratings of Perceived Exertion 11-13    Perceived Dyspnea 0-4      Progression   Progression Continue progressive overload as per policy without signs/symptoms or physical distress.      Resistance Training   Training Prescription Yes    Weight 3 lbs    Reps 10-15             Perform Capillary Blood Glucose checks as needed.  Exercise Prescription Changes:   Exercise Prescription Changes     Row Name 12/05/22 1400 12/24/22 1500 12/26/22 1000 01/16/23 1400 01/18/23 1500     Response to Exercise   Blood Pressure (Admit) 114/80 120/68 -- 118/62 104/60   Blood Pressure (Exercise) 120/80 146/84 -- 136/82 122/68   Blood Pressure (Exit) 102/68 122/64 -- 102/78 104/64   Heart Rate (Admit) 69 bpm 79 bpm -- 71 bpm 89 bpm   Heart Rate (Exercise) 96 bpm 108 bpm -- 110 bpm 102 bpm   Heart Rate (Exit) 78 bpm 79 bpm -- 82 bpm 89 bpm   Rating of Perceived Exertion (Exercise) 11.5 11 -- 11 12   Symptoms None None -- None  None   Comments Pt's first day in the CRP2 program Reviewed METs -- Reveiwed METs and goals Reviewed home exercsie Rx   Duration Continue with 30 min of aerobic exercise without signs/symptoms of physical distress. Continue with 30 min of aerobic exercise without signs/symptoms of physical distress. -- Continue with 30 min of aerobic exercise without signs/symptoms of physical distress. Continue with 30 min of aerobic exercise without signs/symptoms of physical distress.   Intensity THRR unchanged THRR unchanged -- THRR unchanged THRR unchanged     Progression   Progression Continue to progress workloads to maintain intensity without signs/symptoms of physical distress. Continue to progress workloads to maintain intensity without signs/symptoms of physical distress. -- Continue to progress workloads to maintain intensity without signs/symptoms of physical distress. Continue to progress workloads to maintain intensity without signs/symptoms of physical distress.  Average METs 1.85 2.2 -- 2.35 2.45     Resistance Training   Training Prescription No Yes -- Yes Yes   Weight No weights on Wednesdays 3 lbs -- 3 lbs 3 lbs   Reps -- 10-15 -- 10-15 10-15   Time -- 10 Minutes -- 10 Minutes 10 Minutes     Interval Training   Interval Training No No -- No No     NuStep   Level 1 1 -- 3 3   SPM -- 98 -- 106 105   Minutes 15 15 -- 15 15   METs 2.2 2.7 -- 3.1 3.1     Arm Ergometer   Level 1 1 -- 1 1   Watts 9 11 -- 11 13   RPM 39 -- -- 45 49   Minutes 15 15 -- 15 15   METs 1.5 1.7 -- 1.6 1.8     Home Exercise Plan   Plans to continue exercise at -- -- -- -- Home (comment)   Frequency -- -- -- -- Add 2 additional days to program exercise sessions.   Initial Home Exercises Provided -- -- -- -- 01/18/23    Row Name 02/01/23 1600 02/13/23 1500           Response to Exercise   Blood Pressure (Admit) 124/64 105/64      Blood Pressure (Exercise) 132/80 126/72      Blood Pressure (Exit) 104/70  98/62      Heart Rate (Admit) 86 bpm 73 bpm      Heart Rate (Exercise) 109 bpm 113 bpm      Heart Rate (Exit) 91 bpm 82 bpm      Rating of Perceived Exertion (Exercise) 13 12      Symptoms None None      Comments Reviewed METs Reviewed METs and goals      Duration Continue with 30 min of aerobic exercise without signs/symptoms of physical distress. Continue with 30 min of aerobic exercise without signs/symptoms of physical distress.      Intensity THRR unchanged THRR unchanged        Progression   Progression Continue to progress workloads to maintain intensity without signs/symptoms of physical distress. Continue to progress workloads to maintain intensity without signs/symptoms of physical distress.      Average METs 2.6 2.85        Resistance Training   Training Prescription Yes No      Weight 4 lbs No weights on wednesdays      Reps 10-15 --      Time 10 Minutes --        Interval Training   Interval Training No No        NuStep   Level 4 4      SPM 95 98      Minutes 15 15      METs 3.4 3.4        Arm Ergometer   Level 1 2      Watts -- 21      RPM -- 62      Minutes 15 15      METs 1.8 2.3        Home Exercise Plan   Plans to continue exercise at Home (comment) Home (comment)      Frequency Add 2 additional days to program exercise sessions. Add 2 additional days to program exercise sessions.      Initial Home Exercises Provided 01/18/23 01/18/23  Exercise Comments:   Exercise Comments     Row Name 12/05/22 1422 12/24/22 1500 12/26/22 1059 01/16/23 1412 01/18/23 1557   Exercise Comments Pt's first day in the CRP2 program. Pt exercised without complaints. Reviewed METs. Pt is progressing. -- Reviewed METs and goals. Pt is making slow, but steady progress. Will encourage increasing workloads on modalities next session. Reviewed Home exercise Rx. Discussed walking and using the recumbent elliptical he has at home. Encouraged 30 minutes per day  5-7x/week. Pt verbalized understanding of the home exercise Rx and was provided a copy.    Row Name 02/01/23 1427 02/13/23 1540         Exercise Comments Reviewed METs. Continues to progress without complaints. Increased workload on nustep today. Reviewed METs and goals. Pt continues to progess and voices more motivation and interest in his hobbies.               Exercise Goals and Review:   Exercise Goals     Row Name 11/29/22 1214             Exercise Goals   Increase Physical Activity Yes       Intervention Provide advice, education, support and counseling about physical activity/exercise needs.;Develop an individualized exercise prescription for aerobic and resistive training based on initial evaluation findings, risk stratification, comorbidities and participant's personal goals.       Expected Outcomes Short Term: Attend rehab on a regular basis to increase amount of physical activity.;Long Term: Add in home exercise to make exercise part of routine and to increase amount of physical activity.;Long Term: Exercising regularly at least 3-5 days a week.       Increase Strength and Stamina Yes       Intervention Provide advice, education, support and counseling about physical activity/exercise needs.;Develop an individualized exercise prescription for aerobic and resistive training based on initial evaluation findings, risk stratification, comorbidities and participant's personal goals.       Expected Outcomes Short Term: Increase workloads from initial exercise prescription for resistance, speed, and METs.;Short Term: Perform resistance training exercises routinely during rehab and add in resistance training at home;Long Term: Improve cardiorespiratory fitness, muscular endurance and strength as measured by increased METs and functional capacity ( )       Able to understand and use rate of perceived exertion (RPE) scale Yes       Intervention Provide education and explanation on  how to use RPE scale       Expected Outcomes Short Term: Able to use RPE daily in rehab to express subjective intensity level;Long Term:  Able to use RPE to guide intensity level when exercising independently       Knowledge and understanding of Target Heart Rate Range (THRR) Yes       Intervention Provide education and explanation of THRR including how the numbers were predicted and where they are located for reference       Expected Outcomes Short Term: Able to state/look up THRR;Short Term: Able to use daily as guideline for intensity in rehab;Long Term: Able to use THRR to govern intensity when exercising independently       Understanding of Exercise Prescription Yes       Intervention Provide education, explanation, and written materials on patient's individual exercise prescription       Expected Outcomes Short Term: Able to explain program exercise prescription;Long Term: Able to explain home exercise prescription to exercise independently  Exercise Goals Re-Evaluation :  Exercise Goals Re-Evaluation     Row Name 12/05/22 1420 01/16/23 1407 02/13/23 1539         Exercise Goal Re-Evaluation   Exercise Goals Review Increase Physical Activity;Understanding of Exercise Prescription;Increase Strength and Stamina;Knowledge and understanding of Target Heart Rate Range (THRR);Able to understand and use rate of perceived exertion (RPE) scale Increase Physical Activity;Understanding of Exercise Prescription;Increase Strength and Stamina;Knowledge and understanding of Target Heart Rate Range (THRR);Able to understand and use rate of perceived exertion (RPE) scale Increase Physical Activity;Understanding of Exercise Prescription;Increase Strength and Stamina;Knowledge and understanding of Target Heart Rate Range (THRR);Able to understand and use rate of perceived exertion (RPE) scale     Comments Pt's first day in the CRP2 program. Pt understands the exercise Rx, REP scale and THRR.  Reviewed METs and goals. Pt has peak METs of 3.2. Pt voices he is making progress on his goals. Pt voices improvement in upperbody and leg strength, increased stamia, endurance and energy. Pt plans to join a gym. Will discuss home exercise with hime soon. Reviewed METs and goals. Pt has peak METs of 3.6. Pt voices he is making progress on his goals. Pt continues to voice improvement in upperbody and leg strength, increased stamia, endurance and energy. Pt plans to join a gym, possibly [employer] or [employer].     Expected Outcomes Will continue to monitor patient and progress exercise workloads as tolerated. Will continue to monitor patient and progress exercise workloads as tolerated. Will continue to monitor patient and progress exercise workloads as tolerated.              Discharge Exercise Prescription (Final Exercise Prescription Changes):  Exercise Prescription Changes - 02/13/23 1500       Response to Exercise   Blood Pressure (Admit) 105/64    Blood Pressure (Exercise) 126/72    Blood Pressure (Exit) 98/62    Heart Rate (Admit) 73 bpm    Heart Rate (Exercise) 113 bpm    Heart Rate (Exit) 82 bpm    Rating of Perceived Exertion (Exercise) 12    Symptoms None    Comments Reviewed METs and goals    Duration Continue with 30 min of aerobic exercise without signs/symptoms of physical distress.    Intensity THRR unchanged      Progression   Progression Continue to progress workloads to maintain intensity without signs/symptoms of physical distress.    Average METs 2.85      Resistance Training   Training Prescription No    Weight No weights on wednesdays      Interval Training   Interval Training No      NuStep   Level 4    SPM 98    Minutes 15    METs 3.4      Arm Ergometer   Level 2    Watts 21    RPM 62    Minutes 15    METs 2.3      Home Exercise Plan   Plans to continue exercise at Home (comment)    Frequency Add 2 additional days to program exercise sessions.     Initial Home Exercises Provided 01/18/23             Nutrition:  Target Goals: Understanding of nutrition guidelines, daily intake of sodium 1500mg , cholesterol 200mg , calories 30% from fat and 7% or less from saturated fats, daily to have 5 or more servings of fruits and vegetables.  Biometrics:  Pre Biometrics - 11/29/22  1055       Pre Biometrics   Waist Circumference 40.5 inches    Hip Circumference 40 inches    Waist to Hip Ratio 1.01 %    Triceps Skinfold 12 mm    Grip Strength 42 kg    Flexibility 0 in   could not reach   Single Leg Stand 2 seconds             Post Biometrics - 11/29/22 1055        Post  Biometrics   % Body Fat 26.2 %             Nutrition Therapy Plan and Nutrition Goals:  Nutrition Therapy & Goals - 02/25/23 1402       Nutrition Therapy   Diet Heart Healthy Diet    Drug/Food Interactions Statins/Certain Fruits      Personal Nutrition Goals   Nutrition Goal Patient to identify strategies for reducing cardiovascular risk by attending the [employer] education and nutrition series weekly.   goal in action.   Personal Goal #2 Patient to improve diet quality by using the plate method as a guide for meal planning to include lean protein/plant protein, fruits, vegetables, whole grains, nonfat dairy as part of a well-balanced diet.   goal in action.   Personal Goal #3 Patient to reduce sodium to 1500mg  per day   goal in action.   Personal Goal #4 Patient to identify strategies for weight maintenance and weight gain of 0.5-2.0# as needed.   goal in not met.   Comments Goals in action. Theodore Patton and his wife continue to attend the [employer] education and nutrition series regularly. They have started making many dietary changes including implemented many Pritikin recipes, increased dietary fiber, and reduced saturated fat intake. Theodore Patton does report history of unwanted weight loss since shoulder surgery 03/2022 and cardiac surgery in 10/2022; his weight prior  to first surgery was 196# per documentation on 02/12/2022. He is motivated to maintain his current weight and possibly gain a little weight back. We have discussed multiple strategies for weight maintenance/weight gain including calorie density, increasing portion sizes of fat/protein/carbohydrates and increasing eating frequency. He is down 6.6# since starting with our program; BMI remains appropriate for age. He does report normal appetite. Patient will benefit from participation in intensive cardiac rehab for nutrition, exercise, and lifestyle modification.      Intervention Plan   Intervention Prescribe, educate and counsel regarding individualized specific dietary modifications aiming towards targeted core components such as weight, hypertension, lipid management, diabetes, heart failure and other comorbidities.;Nutrition handout(s) given to patient.    Expected Outcomes Short Term Goal: Understand basic principles of dietary content, such as calories, fat, sodium, cholesterol and nutrients.;Long Term Goal: Adherence to prescribed nutrition plan.             Nutrition Assessments:  Nutrition Assessments - 12/25/22 0937       Rate Your Plate Scores   Pre Score 58            MEDIFICTS Score Key: >=70 Need to make dietary changes  40-70 Heart Healthy Diet <= 40 Therapeutic Level Cholesterol Diet   Flowsheet Row INTENSIVE CARDIAC REHAB from 12/24/2022 in [facility], [facility], [facility]  Picture Your Plate Total Score on Admission 58      Picture Your Plate Scores: <16 Unhealthy dietary pattern with much room for improvement. 41-50 Dietary pattern unlikely to meet recommendations for good health and room for  improvement. 51-60 More healthful dietary pattern, with some room for improvement.  >60 Healthy dietary pattern, although there may be some specific behaviors that could be improved.    Nutrition Goals Re-Evaluation:  Nutrition Goals  Re-Evaluation     Row Name 12/05/22 1329 12/24/22 0941 01/25/23 1343 02/25/23 1402       Goals   Current Weight 190 lb 7.6 oz (86.4 kg) 187 lb 6.3 oz (85 kg) 185 lb 3 oz (84 kg) 183 lb 13.8 oz (83.4 kg)    Comment HDL 37, lipoprotein A WNL, hemoglobin 9.7, hematocrit 29.8 no new labs; most recent labs HDL 37, lipoprotein A WNL, hemoglobin 9.7, hematocrit 29.8 no new labs; most recent labs HDL 37, lipoprotein A WNL, hemoglobin 9.7, hematocrit 29.8 no new labs; most recent labs HDL 37, lipoprotein A WNL, hemoglobin 9.7, hematocrit 29.8    Expected Outcome Theodore Patton reports that he and his wife "eat the wrong things" but are receptive to making dietary/lifestyle changes. Patient will benefit from participation in intensive cardiac rehab for nutrition, exercise, and lifestyle modification. Goals in action. Theodore Patton and his wife continue to attend the [employer] education and nutrition series regularly. They have started making many dietary changes including implemented many Pritikin recipes, increased dietary fiber, and reduced saturated fat intake. Theodore Patton does report history of weight loss since shoulder surgery 03/2022 and cardiac surgery in 10/2022; his weight prior to first surgery was 196# per documentation on 02/12/2022. He is motivated to maintain his current weight and possibly gain a little weight back. We discussed strategies for weight maintenance including increasing portion sizes of fat/protein/carbohydrates and increasing eating frequency. Patient will benefit from participation in intensive cardiac rehab for nutrition, exercise, and lifestyle modification. Goals in action. Theodore Patton and his wife continue to attend the [employer] education and nutrition series regularly. They have started making many dietary changes including implemented many Pritikin recipes, increased dietary fiber, and reduced saturated fat intake. Theodore Patton does report history of unwanted weight loss since shoulder surgery 03/2022 and cardiac surgery in  10/2022; his weight prior to first surgery was 196# per documentation on 02/12/2022. He is motivated to maintain his current weight and possibly gain a little weight back. We discussed strategies for weight maintenance/weight gain including increasing portion sizes of fat/protein/carbohydrates and increasing eating frequency. He is down 5.3# since starting with our program; BMI remains appropriate for age. Patient will benefit from participation in intensive cardiac rehab for nutrition, exercise, and lifestyle modification. Goals in action. Theodore Patton and his wife continue to attend the [employer] education and nutrition series regularly. They have started making many dietary changes including implemented many Pritikin recipes, increased dietary fiber, and reduced saturated fat intake. Theodore Patton does report history of unwanted weight loss since shoulder surgery 03/2022 and cardiac surgery in 10/2022; his weight prior to first surgery was 196# per documentation on 02/12/2022. He is motivated to maintain his current weight and possibly gain a little weight back. We have discussed multiple strategies for weight maintenance/weight gain including calorie density, increasing portion sizes of fat/protein/carbohydrates and increasing eating frequency. He is down 6.6# since starting with our program; BMI remains appropriate for age. He does report normal appetite. Patient will benefit from participation in intensive cardiac rehab for nutrition, exercise, and lifestyle modification.             Nutrition Goals Re-Evaluation:  Nutrition Goals Re-Evaluation     Row Name 12/05/22 1329 12/24/22 0941 01/25/23 1343 02/25/23 1402       Goals  Current Weight 190 lb 7.6 oz (86.4 kg) 187 lb 6.3 oz (85 kg) 185 lb 3 oz (84 kg) 183 lb 13.8 oz (83.4 kg)    Comment HDL 37, lipoprotein A WNL, hemoglobin 9.7, hematocrit 29.8 no new labs; most recent labs HDL 37, lipoprotein A WNL, hemoglobin 9.7, hematocrit 29.8 no new labs; most recent labs  HDL 37, lipoprotein A WNL, hemoglobin 9.7, hematocrit 29.8 no new labs; most recent labs HDL 37, lipoprotein A WNL, hemoglobin 9.7, hematocrit 29.8    Expected Outcome Theodore Patton reports that he and his wife "eat the wrong things" but are receptive to making dietary/lifestyle changes. Patient will benefit from participation in intensive cardiac rehab for nutrition, exercise, and lifestyle modification. Goals in action. Theodore Patton and his wife continue to attend the [facility] education and nutrition series regularly. They have started making many dietary changes including implemented many Pritikin recipes, increased dietary fiber, and reduced saturated fat intake. Theodore Patton does report history of weight loss since shoulder surgery 03/2022 and cardiac surgery in 10/2022; his weight prior to first surgery was 196# per documentation on 02/12/2022. He is motivated to maintain his current weight and possibly gain a little weight back. We discussed strategies for weight maintenance including increasing portion sizes of fat/protein/carbohydrates and increasing eating frequency. Patient will benefit from participation in intensive cardiac rehab for nutrition, exercise, and lifestyle modification. Goals in action. Theodore Patton and his wife continue to attend the [facility] education and nutrition series regularly. They have started making many dietary changes including implemented many Pritikin recipes, increased dietary fiber, and reduced saturated fat intake. Theodore Patton does report history of unwanted weight loss since shoulder surgery 03/2022 and cardiac surgery in 10/2022; his weight prior to first surgery was 196# per documentation on 02/12/2022. He is motivated to maintain his current weight and possibly gain a little weight back. We discussed strategies for weight maintenance/weight gain including increasing portion sizes of fat/protein/carbohydrates and increasing eating frequency. He is down 5.3# since starting with our program; BMI remains appropriate  for age. Patient will benefit from participation in intensive cardiac rehab for nutrition, exercise, and lifestyle modification. Goals in action. Theodore Patton and his wife continue to attend the [facility] education and nutrition series regularly. They have started making many dietary changes including implemented many Pritikin recipes, increased dietary fiber, and reduced saturated fat intake. Theodore Patton does report history of unwanted weight loss since shoulder surgery 03/2022 and cardiac surgery in 10/2022; his weight prior to first surgery was 196# per documentation on 02/12/2022. He is motivated to maintain his current weight and possibly gain a little weight back. We have discussed multiple strategies for weight maintenance/weight gain including calorie density, increasing portion sizes of fat/protein/carbohydrates and increasing eating frequency. He is down 6.6# since starting with our program; BMI remains appropriate for age. He does report normal appetite. Patient will benefit from participation in intensive cardiac rehab for nutrition, exercise, and lifestyle modification.             Nutrition Goals Discharge (Final Nutrition Goals Re-Evaluation):  Nutrition Goals Re-Evaluation - 02/25/23 1402       Goals   Current Weight 183 lb 13.8 oz (83.4 kg)    Comment no new labs; most recent labs HDL 37, lipoprotein A WNL, hemoglobin 9.7, hematocrit 29.8    Expected Outcome Goals in action. Theodore Patton and his wife continue to attend the [facility] education and nutrition series regularly. They have started making many dietary changes including implemented many Pritikin recipes, increased dietary fiber, and reduced saturated  fat intake. Theodore Patton does report history of unwanted weight loss since shoulder surgery 03/2022 and cardiac surgery in 10/2022; his weight prior to first surgery was 196# per documentation on 02/12/2022. He is motivated to maintain his current weight and possibly gain a little weight back. We have discussed  multiple strategies for weight maintenance/weight gain including calorie density, increasing portion sizes of fat/protein/carbohydrates and increasing eating frequency. He is down 6.6# since starting with our program; BMI remains appropriate for age. He does report normal appetite. Patient will benefit from participation in intensive cardiac rehab for nutrition, exercise, and lifestyle modification.             Psychosocial: Target Goals: Acknowledge presence or absence of significant depression and/or stress, maximize coping skills, provide positive support system. Participant is able to verbalize types and ability to use techniques and skills needed for reducing stress and depression.  Initial Review & Psychosocial Screening:  Initial Psych Review & Screening - 11/29/22 1225       Initial Review   Current issues with Current Sleep Concerns;History of Depression   Voices he is not currently depressed. Pt feels his Wellbutrin is working well for him.     Family Dynamics   Good Support System? Yes   Has spouse for support     Barriers   Psychosocial barriers to participate in program There are no identifiable barriers or psychosocial needs.      Screening Interventions   Interventions Encouraged to exercise             Quality of Life Scores:  Quality of Life - 11/29/22 1349       Quality of Life   Select Quality of Life      Quality of Life Scores   Health/Function Pre 21 %    Socioeconomic Pre 22.14 %    Psych/Spiritual Pre 24 %    Family Pre 20.5 %    GLOBAL Pre 21.78 %            Scores of 19 and below usually indicate a poorer quality of life in these areas.  A difference of  2-3 points is a clinically meaningful difference.  A difference of 2-3 points in the total score of the Quality of Life Index has been associated with significant improvement in overall quality of life, self-image, physical symptoms, and general health in studies assessing change in  quality of life.  PHQ-9: Review Flowsheet       11/29/2022  Depression screen PHQ 2/9  Decreased Interest 1  Down, Depressed, Hopeless 0  PHQ - 2 Score 1  Altered sleeping 1  Tired, decreased energy 1  Change in appetite 0  Feeling bad or failure about yourself  0  Trouble concentrating 2  Moving slowly or fidgety/restless 2  Suicidal thoughts 0  PHQ-9 Score 7  Difficult doing work/chores Somewhat difficult    Details           Interpretation of Total Score  Total Score Depression Severity:  1-4 = Minimal depression, 5-9 = Mild depression, 10-14 = Moderate depression, 15-19 = Moderately severe depression, 20-27 = Severe depression   Psychosocial Evaluation and Intervention:   Psychosocial Re-Evaluation:  Psychosocial Re-Evaluation     Row Name 01/01/23 1640 01/29/23 1629 02/26/23 1535         Psychosocial Re-Evaluation   Current issues with Current Stress Concerns;History of Depression Current Stress Concerns;History of Depression Current Stress Concerns;History of Depression     Comments Algernon Huxley says  he feels better and does not have any current concerns or stressors No concerns or stressors have been voiced. Theodore Patton will complete cardiac rehab on 03/01/23 No concerns or stressors have been voiced. Theodore Patton will complete cardiac rehab on 03/11/23.Theodore Patton says that participating in cardiac rehab has been helpful for him. Theodore Patton says he feels stronger.     Expected Outcomes Algernon Huxley will have decreased or controlled stress and depressio upon completion of cardiac rehab Theodore Patton will have decreased or controlled stress and depressio upon completion of cardiac rehab Theodore Patton will have decreased or controlled stress and depressio upon completion of cardiac rehab     Interventions Stress management education;Encouraged to attend Cardiac Rehabilitation for the exercise;Relaxation education Stress management education;Encouraged to attend Cardiac Rehabilitation for the exercise;Relaxation education Stress  management education;Encouraged to attend Cardiac Rehabilitation for the exercise;Relaxation education     Continue Psychosocial Services  Follow up required by staff Follow up required by staff No Follow up required       Initial Review   Source of Stress Concerns Chronic Illness;Unable to participate in former interests or hobbies;Unable to perform yard/household activities Chronic Illness;Unable to participate in former interests or hobbies;Unable to perform yard/household activities Chronic Illness;Unable to participate in former interests or hobbies;Unable to perform yard/household activities     Comments Will continue to monitor and offer support as needed Will continue to monitor and offer support as needed Will continue to monitor and offer support as needed              Psychosocial Discharge (Final Psychosocial Re-Evaluation):  Psychosocial Re-Evaluation - 02/26/23 1535       Psychosocial Re-Evaluation   Current issues with Current Stress Concerns;History of Depression    Comments No concerns or stressors have been voiced. Theodore Patton will complete cardiac rehab on 03/11/23.Theodore Patton says that participating in cardiac rehab has been helpful for him. Theodore Patton says he feels stronger.    Expected Outcomes Theodore Patton will have decreased or controlled stress and depressio upon completion of cardiac rehab    Interventions Stress management education;Encouraged to attend Cardiac Rehabilitation for the exercise;Relaxation education    Continue Psychosocial Services  No Follow up required      Initial Review   Source of Stress Concerns Chronic Illness;Unable to participate in former interests or hobbies;Unable to perform yard/household activities    Comments Will continue to monitor and offer support as needed             Vocational Rehabilitation: Provide vocational rehab assistance to qualifying candidates.   Vocational Rehab Evaluation & Intervention:  Vocational Rehab - 11/29/22 1234        Initial Vocational Rehab Evaluation & Intervention   Assessment shows need for Vocational Rehabilitation No   Pt is retired            Education: Education Goals: Education classes will be provided on a weekly basis, covering required topics. Participant will state understanding/return demonstration of topics presented.    Education     Row Name 12/05/22 1600     Education   Cardiac Education Topics Pritikin   Orthoptist   Educator Dietitian   Weekly Topic Efficiency Cooking - Meals in a Snap   Instruction Review Code 1- Verbalizes Understanding   Class Start Time 1400   Class Stop Time 1445   Class Time Calculation (min) 45 min    Row Name 12/07/22 1400     Education   Cardiac Education Topics Pritikin  Select Core Videos     Core Videos   Educator Exercise Physiologist   Select Exercise Education   Exercise Education Move It!   Instruction Review Code 1- Verbalizes Understanding   Class Start Time 1400   Class Stop Time 1445   Class Time Calculation (min) 45 min    Row Name 12/10/22 1400     Education   Cardiac Education Topics Pritikin   Immunologist Exercise Physiologist   Select Psychosocial   Psychosocial Workshop Focused Goals, Sustainable Changes   Instruction Review Code 1- Verbalizes Understanding   Class Start Time 1355   Class Stop Time 1437   Class Time Calculation (min) 42 min    Row Name 12/12/22 1600     Education   Cardiac Education Topics Pritikin   Customer service manager   Weekly Topic One-Pot Wonders   Instruction Review Code 1- Verbalizes Understanding   Class Start Time 1400   Class Stop Time 1450   Class Time Calculation (min) 50 min    Row Name 12/14/22 1500     Education   Cardiac Education Topics Pritikin   Hospital doctor Education   General  Education Hypertension and Heart Disease   Instruction Review Code 1- Verbalizes Understanding   Class Start Time 1400   Class Stop Time 1450   Class Time Calculation (min) 50 min    Row Name 12/19/22 1600     Education   Cardiac Education Topics Pritikin   Customer service manager   Weekly Topic Comforting Weekend Breakfasts   Instruction Review Code 1- Verbalizes Understanding   Class Start Time 1400   Class Stop Time 1445   Class Time Calculation (min) 45 min    Row Name 12/24/22 1500     Education   Cardiac Education Topics Pritikin   Licensed conveyancer Nutrition   Nutrition Facts on Fat   Instruction Review Code 1- Verbalizes Understanding   Class Start Time 1400   Class Stop Time 1440   Class Time Calculation (min) 40 min    Row Name 12/26/22 1500     Education   Cardiac Education Topics Pritikin   Customer service manager   Weekly Topic Fast Evening Meals   Instruction Review Code 1- Verbalizes Understanding   Class Start Time 1400   Class Stop Time 1440   Class Time Calculation (min) 40 min    Row Name 12/28/22 1500     Education   Cardiac Education Topics Pritikin   Licensed conveyancer Nutrition   Nutrition Vitamins and Minerals   Instruction Review Code 1- Verbalizes Understanding   Class Start Time 1400   Class Stop Time 1445   Class Time Calculation (min) 45 min    Row Name 12/31/22 1600     Education   Cardiac Education Topics Pritikin   Geographical information systems officer Psychosocial   Psychosocial Workshop Healthy Sleep for a Healthy Heart   Instruction Review Code 1- Tax inspector  Class Start Time 1400   Class Stop Time 1455   Class Time Calculation (min) 55 min    Row Name 01/02/23 1600     Education    Cardiac Education Topics Pritikin   Customer service manager   Weekly Topic International Cuisine- Spotlight on the [employer] Zones   Instruction Review Code 1- Verbalizes Understanding   Class Start Time 1400   Class Stop Time 1448   Class Time Calculation (min) 48 min    Row Name 01/04/23 1500     Education   Cardiac Education Topics Pritikin   Psychologist, forensic Exercise Education   Exercise Education Improving Performance   Instruction Review Code 1- Verbalizes Understanding   Class Start Time 1400   Class Stop Time 1455   Class Time Calculation (min) 55 min    Row Name 01/07/23 1500     Education   Cardiac Education Topics Pritikin   Glass blower/designer Nutrition   Nutrition Workshop Fueling a Forensic psychologist   Instruction Review Code 1- Tax inspector   Class Start Time 1400   Class Stop Time 1452   Class Time Calculation (min) 52 min    Row Name 01/09/23 1500     Education   Cardiac Education Topics Pritikin   Customer service manager   Weekly Topic Simple Sides and Sauces   Instruction Review Code 1- Verbalizes Understanding   Class Start Time 1400   Class Stop Time 1445   Class Time Calculation (min) 45 min    Row Name 01/16/23 1500     Education   Cardiac Education Topics Pritikin   Customer service manager   Weekly Topic Powerhouse Plant-Based Proteins   Instruction Review Code 1- Verbalizes Understanding   Class Start Time 1400   Class Stop Time 1440   Class Time Calculation (min) 40 min    Row Name 01/18/23 1400     Education   Cardiac Education Topics Pritikin   Hospital doctor Education   General Education Hypertension and Heart Disease    Instruction Review Code 1- Verbalizes Understanding   Class Start Time 1410   Class Stop Time 1445   Class Time Calculation (min) 35 min    Row Name 01/21/23 1300     Education   Cardiac Education Topics Pritikin   Geographical information systems officer Psychosocial   Psychosocial Workshop From Head to Heart: The Power of a Healthy Outlook   Instruction Review Code 1- Verbalizes Understanding   Class Start Time 1400   Class Stop Time 1446   Class Time Calculation (min) 46 min    Row Name 01/23/23 1500     Education   Cardiac Education Topics Pritikin   Customer service manager   Weekly Topic Adding Flavor - Sodium-Free   Instruction Review Code 1- Verbalizes Understanding   Class Start Time 1400   Class Stop Time 1437   Class Time Calculation (min) 37 min    Row Name  01/25/23 1400     Education   Cardiac Education Topics Pritikin   Hospital doctor Education   General Education Heart Disease Risk Reduction   Instruction Review Code 1- Verbalizes Understanding   Class Start Time 1355   Class Stop Time 1436   Class Time Calculation (min) 41 min    Row Name 01/28/23 1500     Education   Cardiac Education Topics Pritikin   Licensed conveyancer Nutrition   Nutrition Overview of the Pritikin Eating Plan   Instruction Review Code 1- Verbalizes Understanding   Class Start Time 1400   Class Stop Time 1452   Class Time Calculation (min) 52 min    Row Name 01/30/23 1500     Education   Cardiac Education Topics Pritikin   Customer service manager   Weekly Topic Fast and Healthy Breakfasts   Instruction Review Code 1- Verbalizes Understanding   Class Start Time 1400   Class Stop Time 1445   Class Time Calculation (min) 45 min    Row  Name 02/01/23 1400     Education   Cardiac Education Topics Pritikin   Nurse, children's Exercise Physiologist   Select Psychosocial   Psychosocial Healthy Minds, Bodies, Hearts   Instruction Review Code 1- Verbalizes Understanding   Class Start Time 1402   Class Stop Time 1445   Class Time Calculation (min) 43 min    Row Name 02/11/23 1600     Education   Cardiac Education Topics Pritikin   Glass blower/designer Nutrition   Nutrition Workshop Label Reading   Instruction Review Code 1- Tax inspector   Class Start Time 1400   Class Stop Time 1445   Class Time Calculation (min) 45 min    Row Name 02/13/23 1600     Education   Cardiac Education Topics Pritikin   Customer service manager   Weekly Topic [employer] Desserts   Instruction Review Code 1- Verbalizes Understanding   Class Start Time 1400   Class Stop Time 1442   Class Time Calculation (min) 42 min    Row Name 02/15/23 1600     Education   Cardiac Education Topics Pritikin   Licensed conveyancer Nutrition   Nutrition Other  label reading   Instruction Review Code 1- Verbalizes Understanding   Class Start Time 1400   Class Stop Time 1500   Class Time Calculation (min) 60 min    Row Name 02/18/23 1500     Education   Cardiac Education Topics Pritikin   Select Workshops     Workshops   Educator Exercise Physiologist   Select Psychosocial   Psychosocial Workshop Recognizing and Reducing Stress   Instruction Review Code 1- Verbalizes Understanding   Class Start Time 1400   Class Stop Time 1447   Class Time Calculation (min) 47 min    Row Name 02/20/23 1500     Education   Cardiac Education Topics Pritikin   Customer service manager  Weekly Topic Tasty Appetizers and Snacks   Instruction  Review Code 1- Verbalizes Understanding   Class Start Time 1355   Class Stop Time 1433   Class Time Calculation (min) 38 min    Row Name 02/22/23 1400     Education   Cardiac Education Topics Pritikin   Select Core Videos     Core Videos   Educator Exercise Physiologist   Select Nutrition   Nutrition Calorie Density   Instruction Review Code 1- Verbalizes Understanding   Class Start Time 1358   Class Stop Time 1432   Class Time Calculation (min) 34 min    Row Name 02/25/23 1500     Education   Cardiac Education Topics Pritikin   Select Workshops     Workshops   Educator Exercise Physiologist   Select Exercise   Exercise Workshop Exercise Basics: Building Your Action Plan   Instruction Review Code 1- Verbalizes Understanding   Class Start Time 1358   Class Stop Time 1449   Class Time Calculation (min) 51 min            Core Videos: Exercise    Move It!  Clinical staff conducted group or individual video education with verbal and written material and guidebook.  Patient learns the recommended Pritikin exercise program. Exercise with the goal of living a long, healthy life. Some of the health benefits of exercise include controlled diabetes, healthier blood pressure levels, improved cholesterol levels, improved heart and lung capacity, improved sleep, and better body composition. Everyone should speak with their doctor before starting or changing an exercise routine.  Biomechanical Limitations Clinical staff conducted group or individual video education with verbal and written material and guidebook.  Patient learns how biomechanical limitations can impact exercise and how we can mitigate and possibly overcome limitations to have an impactful and balanced exercise routine.  Body Composition Clinical staff conducted group or individual video education with verbal and written material and guidebook.  Patient learns that body composition (ratio of muscle mass to fat  mass) is a key component to assessing overall fitness, rather than body weight alone. Increased fat mass, especially visceral belly fat, can put [place] at increased risk for metabolic syndrome, type 2 diabetes, heart disease, and even death. It is recommended to combine diet and exercise (cardiovascular and resistance training) to improve your body composition. Seek guidance from your physician and exercise physiologist before implementing an exercise routine.  Exercise Action Plan Clinical staff conducted group or individual video education with verbal and written material and guidebook.  Patient learns the recommended strategies to achieve and enjoy long-term exercise adherence, including variety, self-motivation, self-efficacy, and positive decision making. Benefits of exercise include fitness, good health, weight management, more energy, better sleep, less stress, and overall well-being.  Medical   Heart Disease Risk Reduction Clinical staff conducted group or individual video education with verbal and written material and guidebook.  Patient learns our heart is our most vital organ as it circulates oxygen, nutrients, white blood cells, and hormones throughout the entire body, and carries waste away. Data supports a plant-based eating plan like the Pritikin Program for its effectiveness in slowing progression of and reversing heart disease. The video provides a number of recommendations to address heart disease.   Metabolic Syndrome and Belly Fat  Clinical staff conducted group or individual video education with verbal and written material and guidebook.  Patient learns what metabolic syndrome is, how it leads to heart disease, and how one can reverse it  and keep it from coming back. You have metabolic syndrome if you have 3 of the following 5 criteria: abdominal obesity, high blood pressure, high triglycerides, low HDL cholesterol, and high blood sugar.  Hypertension and Heart Disease Clinical staff  conducted group or individual video education with verbal and written material and guidebook.  Patient learns that high blood pressure, or hypertension, is very common in the [place]. Hypertension is largely due to excessive salt intake, but other important risk factors include being overweight, physical inactivity, drinking too much alcohol, smoking, and not eating enough potassium from fruits and vegetables. High blood pressure is a leading risk factor for heart attack, stroke, congestive heart failure, dementia, kidney failure, and premature death. Long-term effects of excessive salt intake include stiffening of the arteries and thickening of heart muscle and organ damage. Recommendations include ways to reduce hypertension and the risk of heart disease.  Diseases of Our Time - Focusing on Diabetes Clinical staff conducted group or individual video education with verbal and written material and guidebook.  Patient learns why the best way to stop diseases of our time is prevention, through food and other lifestyle changes. Medicine (such as prescription pills and surgeries) is often only a Band-Aid on the problem, not a long-term solution. Most common diseases of our time include obesity, type 2 diabetes, hypertension, heart disease, and cancer. The Pritikin Program is recommended and has been proven to help reduce, reverse, and/or prevent the damaging effects of metabolic syndrome.  Nutrition   Overview of the Pritikin Eating Plan  Clinical staff conducted group or individual video education with verbal and written material and guidebook.  Patient learns about the Pritikin Eating Plan for disease risk reduction. The Pritikin Eating Plan emphasizes a wide variety of unrefined, minimally-processed carbohydrates, like fruits, vegetables, whole grains, and legumes. Go, Caution, and Stop food choices are explained. Plant-based and lean animal proteins are emphasized. Rationale provided for low sodium  intake for blood pressure control, low added sugars for blood sugar stabilization, and low added fats and oils for coronary artery disease risk reduction and weight management.  Calorie Density  Clinical staff conducted group or individual video education with verbal and written material and guidebook.  Patient learns about calorie density and how it impacts the Pritikin Eating Plan. Knowing the characteristics of the food you choose will help you decide whether those foods will lead to weight gain or weight loss, and whether you want to consume more or less of them. Weight loss is usually a side effect of the Pritikin Eating Plan because of its focus on low calorie-dense foods.  Label Reading  Clinical staff conducted group or individual video education with verbal and written material and guidebook.  Patient learns about the Pritikin recommended label reading guidelines and corresponding recommendations regarding calorie density, added sugars, sodium content, and whole grains.  Dining Out - Part 1  Clinical staff conducted group or individual video education with verbal and written material and guidebook.  Patient learns that restaurant meals can be sabotaging because they can be so high in calories, fat, sodium, and/or sugar. Patient learns recommended strategies on how to positively address this and avoid unhealthy pitfalls.  Facts on Fats  Clinical staff conducted group or individual video education with verbal and written material and guidebook.  Patient learns that lifestyle modifications can be just as effective, if not more so, as many medications for lowering your risk of heart disease. A Pritikin lifestyle can help to reduce your risk  of inflammation and atherosclerosis (cholesterol build-up, or plaque, in the artery walls). Lifestyle interventions such as dietary choices and physical activity address the cause of atherosclerosis. A review of the types of fats and their impact on blood  cholesterol levels, along with dietary recommendations to reduce fat intake is also included.  Nutrition Action Plan  Clinical staff conducted group or individual video education with verbal and written material and guidebook.  Patient learns how to incorporate Pritikin recommendations into their lifestyle. Recommendations include planning and keeping personal health goals in mind as an important part of their success.  Healthy Mind-Set    Healthy Minds, Bodies, Hearts  Clinical staff conducted group or individual video education with verbal and written material and guidebook.  Patient learns how to identify when they are stressed. Video will discuss the impact of that stress, as well as the many benefits of stress management. Patient will also be introduced to stress management techniques. The way we think, act, and feel has an impact on our hearts.  How Our Thoughts Can Heal Our Hearts  Clinical staff conducted group or individual video education with verbal and written material and guidebook.  Patient learns that negative thoughts can cause depression and anxiety. This can result in negative lifestyle behavior and serious health problems. Cognitive behavioral therapy is an effective method to help control our thoughts in order to change and improve our emotional outlook.  Additional Videos:  Exercise    Improving Performance  Clinical staff conducted group or individual video education with verbal and written material and guidebook.  Patient learns to use a non-linear approach by alternating intensity levels and lengths of time spent exercising to help burn more calories and lose more body fat. Cardiovascular exercise helps improve heart health, metabolism, hormonal balance, blood sugar control, and recovery from fatigue. Resistance training improves strength, endurance, balance, coordination, reaction time, metabolism, and muscle mass. Flexibility exercise improves circulation, posture, and  balance. Seek guidance from your physician and exercise physiologist before implementing an exercise routine and learn your capabilities and proper form for all exercise.  Introduction to Yoga  Clinical staff conducted group or individual video education with verbal and written material and guidebook.  Patient learns about yoga, a discipline of the coming together of mind, breath, and body. The benefits of yoga include improved flexibility, improved range of motion, better posture and core strength, increased lung function, weight loss, and positive self-image. Yoga's heart health benefits include lowered blood pressure, healthier heart rate, decreased cholesterol and triglyceride levels, improved immune function, and reduced stress. Seek guidance from your physician and exercise physiologist before implementing an exercise routine and learn your capabilities and proper form for all exercise.  Medical   Aging: Enhancing Your Quality of Life  Clinical staff conducted group or individual video education with verbal and written material and guidebook.  Patient learns key strategies and recommendations to stay in good physical health and enhance quality of life, such as prevention strategies, having an advocate, securing a Health Care Proxy and Power of Attorney, and keeping a list of medications and system for tracking them. It also discusses how to avoid risk for bone loss.  Biology of Weight Control  Clinical staff conducted group or individual video education with verbal and written material and guidebook.  Patient learns that weight gain occurs because we consume more calories than we burn (eating more, moving less). Even if your body weight is normal, you may have higher ratios of fat compared to muscle mass.  Too much body fat puts you at increased risk for cardiovascular disease, heart attack, stroke, type 2 diabetes, and obesity-related cancers. In addition to exercise, following the Pritikin Eating  Plan can help reduce your risk.  Decoding Lab Results  Clinical staff conducted group or individual video education with verbal and written material and guidebook.  Patient learns that lab test reflects one measurement whose values change over time and are influenced by many factors, including medication, stress, sleep, exercise, food, hydration, pre-existing medical conditions, and more. It is recommended to use the knowledge from this video to become more involved with your lab results and evaluate your numbers to speak with your doctor.   Diseases of Our Time - Overview  Clinical staff conducted group or individual video education with verbal and written material and guidebook.  Patient learns that according to the CDC, 50% to 70% of chronic diseases (such as obesity, type 2 diabetes, elevated lipids, hypertension, and heart disease) are avoidable through lifestyle improvements including healthier food choices, listening to satiety cues, and increased physical activity.  Sleep Disorders Clinical staff conducted group or individual video education with verbal and written material and guidebook.  Patient learns how good quality and duration of sleep are important to overall health and well-being. Patient also learns about sleep disorders and how they impact health along with recommendations to address them, including discussing with a physician.  Nutrition  Dining Out - Part 2 Clinical staff conducted group or individual video education with verbal and written material and guidebook.  Patient learns how to plan ahead and communicate in order to maximize their dining experience in a healthy and nutritious manner. Included are recommended food choices based on the type of restaurant the patient is visiting.   Fueling a Banker conducted group or individual video education with verbal and written material and guidebook.  There is a strong connection between our food choices  and our health. Diseases like obesity and type 2 diabetes are very prevalent and are in large-part due to lifestyle choices. The Pritikin Eating Plan provides plenty of food and hunger-curbing satisfaction. It is easy to follow, affordable, and helps reduce health risks.  Menu Workshop  Clinical staff conducted group or individual video education with verbal and written material and guidebook.  Patient learns that restaurant meals can sabotage health goals because they are often packed with calories, fat, sodium, and sugar. Recommendations include strategies to plan ahead and to communicate with the manager, chef, or server to help order a healthier meal.  Planning Your Eating Strategy  Clinical staff conducted group or individual video education with verbal and written material and guidebook.  Patient learns about the Pritikin Eating Plan and its benefit of reducing the risk of disease. The Pritikin Eating Plan does not focus on calories. Instead, it emphasizes high-quality, nutrient-rich foods. By knowing the characteristics of the foods, we choose, we can determine their calorie density and make informed decisions.  Targeting Your Nutrition Priorities  Clinical staff conducted group or individual video education with verbal and written material and guidebook.  Patient learns that lifestyle habits have a tremendous impact on disease risk and progression. This video provides eating and physical activity recommendations based on your personal health goals, such as reducing LDL cholesterol, losing weight, preventing or controlling type 2 diabetes, and reducing high blood pressure.  Vitamins and Minerals  Clinical staff conducted group or individual video education with verbal and written material and guidebook.  Patient learns different  ways to obtain key vitamins and minerals, including through a recommended healthy diet. It is important to discuss all supplements you take with your  doctor.   Healthy Mind-Set    Smoking Cessation  Clinical staff conducted group or individual video education with verbal and written material and guidebook.  Patient learns that cigarette smoking and tobacco addiction pose a serious health risk which affects millions of people. Stopping smoking will significantly reduce the risk of heart disease, lung disease, and many forms of cancer. Recommended strategies for quitting are covered, including working with your doctor to develop a successful plan.  Culinary   Becoming a Set designer conducted group or individual video education with verbal and written material and guidebook.  Patient learns that cooking at home can be healthy, cost-effective, quick, and puts them in control. Keys to cooking healthy recipes will include looking at your recipe, assessing your equipment needs, planning ahead, making it simple, choosing cost-effective seasonal ingredients, and limiting the use of added fats, salts, and sugars.  Cooking - Breakfast and Snacks  Clinical staff conducted group or individual video education with verbal and written material and guidebook.  Patient learns how important breakfast is to satiety and nutrition through the entire day. Recommendations include key foods to eat during breakfast to help stabilize blood sugar levels and to prevent overeating at meals later in the day. Planning ahead is also a key component.  Cooking - Educational psychologist conducted group or individual video education with verbal and written material and guidebook.  Patient learns eating strategies to improve overall health, including an approach to cook more at home. Recommendations include thinking of animal protein as a side on your plate rather than center stage and focusing instead on lower calorie dense options like vegetables, fruits, whole grains, and plant-based proteins, such as beans. Making sauces in large quantities to freeze  for later and leaving the skin on your vegetables are also recommended to maximize your experience.  Cooking - Healthy Salads and Dressing Clinical staff conducted group or individual video education with verbal and written material and guidebook.  Patient learns that vegetables, fruits, whole grains, and legumes are the foundations of the Pritikin Eating Plan. Recommendations include how to incorporate each of these in flavorful and healthy salads, and how to create homemade salad dressings. Proper handling of ingredients is also covered. Cooking - Soups and [employer] - Soups and Desserts Clinical staff conducted group or individual video education with verbal and written material and guidebook.  Patient learns that Pritikin soups and desserts make for easy, nutritious, and delicious snacks and meal components that are low in sodium, fat, sugar, and calorie density, while high in vitamins, minerals, and filling fiber. Recommendations include simple and healthy ideas for soups and desserts.   Overview     The Pritikin Solution Program Overview Clinical staff conducted group or individual video education with verbal and written material and guidebook.  Patient learns that the results of the Pritikin Program have been documented in more than 100 articles published in peer-reviewed journals, and the benefits include reducing risk factors for (and, in some cases, even reversing) high cholesterol, high blood pressure, type 2 diabetes, obesity, and more! An overview of the three key pillars of the Pritikin Program will be covered: eating well, doing regular exercise, and having a healthy mind-set.  WORKSHOPS  Exercise: Exercise Basics: Building Your Action Plan Clinical staff led group instruction and group discussion with  PowerPoint presentation and patient guidebook. To enhance the learning environment the use of posters, models and videos may be added. At the conclusion of this workshop,  patients will comprehend the difference between physical activity and exercise, as well as the benefits of incorporating both, into their routine. Patients will understand the FITT (Frequency, Intensity, Time, and Type) principle and how to use it to build an exercise action plan. In addition, safety concerns and other considerations for exercise and cardiac rehab will be addressed by the presenter. The purpose of this lesson is to promote a comprehensive and effective weekly exercise routine in order to improve patients' overall level of fitness.   Managing Heart Disease: Your Path to a Healthier Heart Clinical staff led group instruction and group discussion with PowerPoint presentation and patient guidebook. To enhance the learning environment the use of posters, models and videos may be added.At the conclusion of this workshop, patients will understand the anatomy and physiology of the heart. Additionally, they will understand how Pritikin's three pillars impact the risk factors, the progression, and the management of heart disease.  The purpose of this lesson is to provide a high-level overview of the heart, heart disease, and how the Pritikin lifestyle positively impacts risk factors.  Exercise Biomechanics Clinical staff led group instruction and group discussion with PowerPoint presentation and patient guidebook. To enhance the learning environment the use of posters, models and videos may be added. Patients will learn how the structural parts of their bodies function and how these functions impact their daily activities, movement, and exercise. Patients will learn how to promote a neutral spine, learn how to manage pain, and identify ways to improve their physical movement in order to promote healthy living. The purpose of this lesson is to expose patients to common physical limitations that impact physical activity. Participants will learn practical ways to adapt and manage aches and  pains, and to minimize their effect on regular exercise. Patients will learn how to maintain good posture while sitting, walking, and lifting.  Balance Training and Fall Prevention  Clinical staff led group instruction and group discussion with PowerPoint presentation and patient guidebook. To enhance the learning environment the use of posters, models and videos may be added. At the conclusion of this workshop, patients will understand the importance of their sensorimotor skills (vision, proprioception, and the vestibular system) in maintaining their ability to balance as they age. Patients will apply a variety of balancing exercises that are appropriate for their current level of function. Patients will understand the common causes for poor balance, possible solutions to these problems, and ways to modify their physical environment in order to minimize their fall risk. The purpose of this lesson is to teach patients about the importance of maintaining balance as they age and ways to minimize their risk of falling.  WORKSHOPS   Nutrition:  Fueling a Ship broker led group instruction and group discussion with PowerPoint presentation and patient guidebook. To enhance the learning environment the use of posters, models and videos may be added. Patients will review the foundational principles of the Pritikin Eating Plan and understand what constitutes a serving size in each of the food groups. Patients will also learn Pritikin-friendly foods that are better choices when away from home and review make-ahead meal and snack options. Calorie density will be reviewed and applied to three nutrition priorities: weight maintenance, weight loss, and weight gain. The purpose of this lesson is to reinforce (in a group setting) the key concepts  around what patients are recommended to eat and how to apply these guidelines when away from home by planning and selecting Pritikin-friendly options.  Patients will understand how calorie density may be adjusted for different weight management goals.  Mindful Eating  Clinical staff led group instruction and group discussion with PowerPoint presentation and patient guidebook. To enhance the learning environment the use of posters, models and videos may be added. Patients will briefly review the concepts of the Pritikin Eating Plan and the importance of low-calorie dense foods. The concept of mindful eating will be introduced as well as the importance of paying attention to internal hunger signals. Triggers for non-hunger eating and techniques for dealing with triggers will be explored. The purpose of this lesson is to provide patients with the opportunity to review the basic principles of the Pritikin Eating Plan, discuss the value of eating mindfully and how to measure internal cues of hunger and fullness using the Hunger Scale. Patients will also discuss reasons for non-hunger eating and learn strategies to use for controlling emotional eating.  Targeting Your Nutrition Priorities Clinical staff led group instruction and group discussion with PowerPoint presentation and patient guidebook. To enhance the learning environment the use of posters, models and videos may be added. Patients will learn how to determine their genetic susceptibility to disease by reviewing their family history. Patients will gain insight into the importance of diet as part of an overall healthy lifestyle in mitigating the impact of genetics and other environmental insults. The purpose of this lesson is to provide patients with the opportunity to assess their personal nutrition priorities by looking at their family history, their own health history and current risk factors. Patients will also be able to discuss ways of prioritizing and modifying the Pritikin Eating Plan for their highest risk areas  Menu  Clinical staff led group instruction and group discussion with PowerPoint  presentation and patient guidebook. To enhance the learning environment the use of posters, models and videos may be added. Using menus brought in from [employer], or printed from [employer], patients will apply the Pritikin dining out guidelines that were presented in the [employer] video. Patients will also be able to practice these guidelines in a variety of provided scenarios. The purpose of this lesson is to provide patients with the opportunity to practice hands-on learning of the Pritikin Dining Out guidelines with actual menus and practice scenarios.  Label Reading Clinical staff led group instruction and group discussion with PowerPoint presentation and patient guidebook. To enhance the learning environment the use of posters, models and videos may be added. Patients will review and discuss the Pritikin label reading guidelines presented in Pritikin's Label Reading Educational series video. Using fool labels brought in from local grocery stores and markets, patients will apply the label reading guidelines and determine if the packaged food meet the Pritikin guidelines. The purpose of this lesson is to provide patients with the opportunity to review, discuss, and practice hands-on learning of the Pritikin Label Reading guidelines with actual packaged food labels. Cooking School  Pritikin's [employer] are designed to teach patients ways to prepare quick, simple, and affordable recipes at home. The importance of nutrition's role in chronic disease risk reduction is reflected in its emphasis in the overall Pritikin program. By learning how to prepare essential core Pritikin Eating Plan recipes, patients will increase control over what they eat; be able to customize the flavor of foods without the use of added salt, sugar,  or fat; and improve the quality of the food they consume. By learning a set of core recipes which are easily assembled, quickly prepared, and  affordable, patients are more likely to prepare more healthy foods at home. These workshops focus on convenient breakfasts, simple entres, side dishes, and desserts which can be prepared with minimal effort and are consistent with nutrition recommendations for cardiovascular risk reduction. Cooking [employer] are taught by a Armed forces logistics/support/administrative officer (RD) who has been trained by the [employer]. The chef or RD has a clear understanding of the importance of minimizing - if not completely eliminating - added fat, sugar, and sodium in recipes. Throughout the series of Cooking School Workshop sessions, patients will learn about healthy ingredients and efficient methods of cooking to build confidence in their capability to prepare    Cooking School weekly topics:  Adding Flavor- Sodium-Free  Fast and Healthy Breakfasts  Powerhouse Plant-Based Proteins  Satisfying Salads and Dressings  Simple Sides and Sauces  International Cuisine-Spotlight on the [employer] Zones  Delicious Desserts  Savory Soups  [employer] - Meals in a Astronomer Appetizers and Snacks  Comforting Weekend Breakfasts  One-Pot Wonders   Fast Evening Meals  Landscape architect Your Pritikin Plate  WORKSHOPS   Healthy Mindset (Psychosocial):  Focused Goals, Sustainable Changes Clinical staff led group instruction and group discussion with PowerPoint presentation and patient guidebook. To enhance the learning environment the use of posters, models and videos may be added. Patients will be able to apply effective goal setting strategies to establish at least one personal goal, and then take consistent, meaningful action toward that goal. They will learn to identify common barriers to achieving personal goals and develop strategies to overcome them. Patients will also gain an understanding of how our mind-set can impact our ability to achieve goals and the importance of cultivating a positive  and growth-oriented mind-set. The purpose of this lesson is to provide patients with a deeper understanding of how to set and achieve personal goals, as well as the tools and strategies needed to overcome common obstacles which may arise along the way.  From Head to Heart: The Power of a Healthy Outlook  Clinical staff led group instruction and group discussion with PowerPoint presentation and patient guidebook. To enhance the learning environment the use of posters, models and videos may be added. Patients will be able to recognize and describe the impact of emotions and mood on physical health. They will discover the importance of self-care and explore self-care practices which may work for them. Patients will also learn how to utilize the 4 C's to cultivate a healthier outlook and better manage stress and challenges. The purpose of this lesson is to demonstrate to patients how a healthy outlook is an essential part of maintaining good health, especially as they continue their cardiac rehab journey.  Healthy Sleep for a Healthy Heart Clinical staff led group instruction and group discussion with PowerPoint presentation and patient guidebook. To enhance the learning environment the use of posters, models and videos may be added. At the conclusion of this workshop, patients will be able to demonstrate knowledge of the importance of sleep to overall health, well-being, and quality of life. They will understand the symptoms of, and treatments for, common sleep disorders. Patients will also be able to identify daytime and nighttime behaviors which impact sleep, and they will be able to apply these tools to help manage sleep-related challenges. The purpose of this  lesson is to provide patients with a general overview of sleep and outline the importance of quality sleep. Patients will learn about a few of the most common sleep disorders. Patients will also be introduced to the concept of "sleep hygiene," and  discover ways to self-manage certain sleeping problems through simple daily behavior changes. Finally, the workshop will motivate patients by clarifying the links between quality sleep and their goals of heart-healthy living.   Recognizing and Reducing Stress Clinical staff led group instruction and group discussion with PowerPoint presentation and patient guidebook. To enhance the learning environment the use of posters, models and videos may be added. At the conclusion of this workshop, patients will be able to understand the types of stress reactions, differentiate between acute and chronic stress, and recognize the impact that chronic stress has on their health. They will also be able to apply different coping mechanisms, such as reframing negative self-talk. Patients will have the opportunity to practice a variety of stress management techniques, such as deep abdominal breathing, progressive muscle relaxation, and/or guided imagery.  The purpose of this lesson is to educate patients on the role of stress in their lives and to provide healthy techniques for coping with it.  Learning Barriers/Preferences:  Learning Barriers/Preferences - 11/29/22 1227       Learning Barriers/Preferences   Learning Barriers Sight;Hearing   wears glasses/ hearing loss left ear   Learning Preferences Skilled Demonstration;Individual Instruction;Computer/Internet;Video;Pictoral             Education Topics:  Knowledge Questionnaire Score:  Knowledge Questionnaire Score - 11/29/22 1352       Knowledge Questionnaire Score   Pre Score 20/24             Core Components/Risk Factors/Patient Goals at Admission:  Personal Goals and Risk Factors at Admission - 11/29/22 1234       Core Components/Risk Factors/Patient Goals on Admission   Hypertension Yes    Intervention Provide education on lifestyle modifcations including regular physical activity/exercise, weight management, moderate sodium  restriction and increased consumption of fresh fruit, vegetables, and low fat dairy, alcohol moderation, and smoking cessation.;Monitor prescription use compliance.    Expected Outcomes Short Term: Continued assessment and intervention until BP is < 140/50mm HG in hypertensive participants. < 130/21mm HG in hypertensive participants with diabetes, heart failure or chronic kidney disease.;Long Term: Maintenance of blood pressure at goal levels.    Lipids Yes    Intervention Provide education and support for participant on nutrition & aerobic/resistive exercise along with prescribed medications to achieve LDL 70mg , HDL >40mg .    Expected Outcomes Short Term: Participant states understanding of desired cholesterol values and is compliant with medications prescribed. Participant is following exercise prescription and nutrition guidelines.;Long Term: Cholesterol controlled with medications as prescribed, with individualized exercise RX and with personalized nutrition plan. Value goals: LDL < 70mg , HDL > 40 mg.             Core Components/Risk Factors/Patient Goals Review:   Goals and Risk Factor Review     Row Name 01/29/23 1632 02/26/23 1537           Core Components/Risk Factors/Patient Goals Review   Personal Goals Review Weight Management/Obesity;Hypertension;Lipids Weight Management/Obesity;Hypertension;Lipids      Review Theodore Patton is doing well with exercise at cardiac rehab. Vital signs have been stalbe. Theodore Patton has lost 2.4 since startting cardiac rehab and reports feeling much stonger Theodore Patton is doing well with exercise at cardiac rehab. Vital signs have been stable. Theodore Patton has  lost 3.0  since startting cardiac rehab and reports feeling much stonger      Expected Outcomes Theodore Patton will continue to participate in cardiac rehab for exercise, nutrition and lifestyle modifications Theodore Patton will continue to participate in cardiac rehab for exercise, nutrition and lifestyle modifications               Core  Components/Risk Factors/Patient Goals at Discharge (Final Review):   Goals and Risk Factor Review - 02/26/23 1537       Core Components/Risk Factors/Patient Goals Review   Personal Goals Review Weight Management/Obesity;Hypertension;Lipids    Review Theodore Patton is doing well with exercise at cardiac rehab. Vital signs have been stable. Theodore Patton has lost 3.0  since startting cardiac rehab and reports feeling much stonger    Expected Outcomes Theodore Patton will continue to participate in cardiac rehab for exercise, nutrition and lifestyle modifications             ITP Comments:  ITP Comments     Row Name 11/29/22 1045 12/05/22 1631 01/01/23 1630 01/29/23 1628 02/26/23 1534   ITP Comments Armanda Magic, MD:  Medical Director. Introduction to the [facility] / [facility].  Initial orientation packet reviewed with the patient. 30 Day ITP Review. Algernon Huxley started cardiac rehab on 12/05/22 and did well with exercise 30 Day ITP Review. Algernon Huxley has good attendance and participation in cardiac rehab 30 Day ITP Review. Theodore Patton  has good attendance and participation in cardiac rehab. Theodore Patton will complete cardiac rehab on 03/01/23 30 Day ITP Review. Theodore Patton  has good attendance and participation in cardiac rehab. Theodore Patton will complete cardiac rehab on 03/11/23            Comments: See ITP review.Thayer Headings RN BSN

## 2023-02-27 ENCOUNTER — Ambulatory Visit: Payer: Medicare Other | Admitting: Cardiology

## 2023-02-27 ENCOUNTER — Encounter (HOSPITAL_COMMUNITY)
Admission: RE | Admit: 2023-02-27 | Discharge: 2023-02-27 | Disposition: A | Discharge status: Home / Self Care | Payer: Medicare Other | Source: Ambulatory Visit | Attending: Cardiology

## 2023-02-27 DIAGNOSIS — Z951 Presence of aortocoronary bypass graft: Secondary | ICD-10-CM | POA: Diagnosis not present

## 2023-02-27 DIAGNOSIS — Z952 Presence of prosthetic heart valve: Secondary | ICD-10-CM

## 2023-03-01 ENCOUNTER — Encounter (HOSPITAL_COMMUNITY)
Admission: RE | Admit: 2023-03-01 | Discharge: 2023-03-01 | Disposition: A | Discharge status: Home / Self Care | Payer: Medicare Other | Source: Ambulatory Visit | Attending: Cardiology | Admitting: Cardiology

## 2023-03-01 DIAGNOSIS — Z951 Presence of aortocoronary bypass graft: Secondary | ICD-10-CM

## 2023-03-01 DIAGNOSIS — Z952 Presence of prosthetic heart valve: Secondary | ICD-10-CM

## 2023-03-04 ENCOUNTER — Encounter (HOSPITAL_COMMUNITY)
Admission: RE | Admit: 2023-03-04 | Discharge: 2023-03-04 | Disposition: A | Discharge status: Home / Self Care | Payer: Medicare Other | Source: Ambulatory Visit | Attending: Cardiology

## 2023-03-04 ENCOUNTER — Ambulatory Visit (HOSPITAL_COMMUNITY): Payer: Medicare Other | Attending: Cardiology

## 2023-03-04 DIAGNOSIS — Z952 Presence of prosthetic heart valve: Secondary | ICD-10-CM | POA: Insufficient documentation

## 2023-03-04 DIAGNOSIS — Z951 Presence of aortocoronary bypass graft: Secondary | ICD-10-CM

## 2023-03-04 LAB — ECHOCARDIOGRAM COMPLETE
AR max vel: 1.71 cm2
AV Area VTI: 1.81 cm2
AV Area mean vel: 1.81 cm2
AV Mean grad: 6 mm[Hg]
AV Peak grad: 12 mm[Hg]
Ao pk vel: 1.74 m/s
Area-P 1/2: 1.59 cm2
S' Lateral: 3.7 cm

## 2023-03-05 ENCOUNTER — Telehealth: Payer: Self-pay

## 2023-03-05 NOTE — Telephone Encounter (Signed)
Reviewed echo results with patient, which showed mildly reduced heart function and the main pumping chamber of the heart and mildly thickened heart muscle with increased stiffness of the heart normal for his age.  The right sided pumping chamber function is mildly reduced.  There is trivial leakiness of the mitral valve.  His aortic valve replacement appears stable.  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  Patient verbalizes understanding that heart function appears stable compared to echo 11/30/2022.

## 2023-03-05 NOTE — Telephone Encounter (Signed)
-----   Message from Armanda Magic sent at 03/04/2023  2:32 PM EDT ----- Echo showed mildly reduced heart function and the main pumping chamber of the heart and mildly thickened heart muscle with increased stiffness of the heart normal for his age.  The right sided pumping chamber function is mildly reduced.  There is trivial leakiness of the mitral valve.  His aortic valve replacement appears stable.  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  Heart function appears stable compared to echo 11/30/2022

## 2023-03-06 ENCOUNTER — Encounter (HOSPITAL_COMMUNITY)
Admission: RE | Admit: 2023-03-06 | Discharge: 2023-03-06 | Disposition: A | Discharge status: Home / Self Care | Payer: Medicare Other | Source: Ambulatory Visit | Attending: Cardiology | Admitting: Cardiology

## 2023-03-06 DIAGNOSIS — Z951 Presence of aortocoronary bypass graft: Secondary | ICD-10-CM | POA: Diagnosis present

## 2023-03-06 DIAGNOSIS — Z48812 Encounter for surgical aftercare following surgery on the circulatory system: Secondary | ICD-10-CM | POA: Diagnosis not present

## 2023-03-06 DIAGNOSIS — Z952 Presence of prosthetic heart valve: Secondary | ICD-10-CM

## 2023-03-06 NOTE — Progress Notes (Signed)
Entry BP 98/62 heart rate 81. Patient exercise without complaints today. Blood pressure 118/72 heart rate 108. Post exercise blood pressure 91/55 heart rate 89 via auto cuff. BP difficult to hear manually. Patient asymptomatic. Had drank 48 ounces of water prior to coming to exercise. Patient given 16 ounces of water. Repeat blood pressure 91/65. Standing blood pressure 101/68. Tammy Sours has lost 4.4 kg will forward to Dr Mayford Knife for review. Gladstone Lighter, RN,BSN 03/06/2023 5:27 PM

## 2023-03-08 ENCOUNTER — Encounter (HOSPITAL_COMMUNITY)
Admission: RE | Admit: 2023-03-08 | Discharge: 2023-03-08 | Disposition: A | Discharge status: Home / Self Care | Payer: Medicare Other | Source: Ambulatory Visit | Attending: Cardiology | Admitting: Cardiology

## 2023-03-08 VITALS — Ht 71.75 in | Wt 183.0 lb

## 2023-03-08 DIAGNOSIS — Z952 Presence of prosthetic heart valve: Secondary | ICD-10-CM

## 2023-03-08 DIAGNOSIS — Z951 Presence of aortocoronary bypass graft: Secondary | ICD-10-CM | POA: Diagnosis not present

## 2023-03-11 ENCOUNTER — Encounter (HOSPITAL_COMMUNITY): Payer: Medicare Other

## 2023-03-13 NOTE — Progress Notes (Signed)
Discharge Progress Report  Patient Details  Name: Theodore Patton MRN: [id number] Date of Birth: [birth year] Referring Provider:   Flowsheet Row INTENSIVE CARDIAC REHAB ORIENT from 11/29/2022 in [facility]  Referring Provider Armanda Magic, MD        Number of Visits: 56  Reason for Discharge:  Patient reached a stable level of exercise. Patient independent in their exercise. Patient has met program and personal goals.  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)  ADL UCSD:   Initial Exercise Prescription:  Initial Exercise Prescription - 11/29/22 1200       Date of Initial Exercise RX and Referring Provider   Date 11/29/22    Referring Provider Armanda Magic, MD    Expected Discharge Date 02/08/23      NuStep   Level 1    SPM 80    Minutes 15    METs 2.45      Arm Ergometer   Level 1    Watts 25    RPM 60    Minutes 15    METs 2.45      Prescription Details   Frequency (times per week) 3    Duration Progress to 30 minutes of continuous aerobic without signs/symptoms of physical distress      Intensity   THRR 40-80% of Max Heartrate 58-116    Ratings of Perceived Exertion 11-13    Perceived Dyspnea 0-4      Progression   Progression Continue progressive overload as per policy without signs/symptoms or physical distress.      Resistance Training   Training Prescription Yes    Weight 3 lbs    Reps 10-15             Discharge Exercise Prescription (Final Exercise Prescription Changes):  Exercise Prescription Changes - 03/08/23 1400       Response to Exercise   Blood Pressure (Admit) 104/62    Blood Pressure (Exercise) 124/66    Blood Pressure (Exit)  104/66    Heart Rate (Admit) 81 bpm    Heart Rate (Exercise) 111 bpm    Heart Rate (Exit) 93 bpm    Rating of Perceived Exertion (Exercise) 11    Symptoms None    Comments Pt last day in CRP2    Duration Continue with 30 min of aerobic exercise without signs/symptoms of physical distress.    Intensity THRR unchanged      Progression   Progression Continue to progress workloads to maintain intensity without signs/symptoms of physical distress.    Average METs 2.93      Resistance Training   Training Prescription Yes    Weight 5 lbs    Reps 10-15    Time 10 Minutes      Interval Training   Interval Training No      Arm Ergometer   Level 2.5    RPM 55    Minutes 15    METs 2.6      Track   Laps 1550   feet- discharge   Minutes 6    METs 3.25      Home Exercise Plan   Plans to continue exercise at  Home (comment)    Frequency Add 2 additional days to program exercise sessions.    Initial Home Exercises Provided 01/18/23             Functional Capacity:  6 Minute Walk     Row Name 11/29/22 1142 03/08/23 1426       6 Minute Walk   Phase Initial  Used Go-cart Initial  no go-cart    Distance 1233 feet 1550 feet    Distance % Change -- 27.71 %    Distance Feet Change -- 317 ft    Walk Time 6 minutes 6 minutes    # of Rest Breaks 0 0    MPH 2.34 2.94    METS 2.45 3.25    RPE 10 11    Perceived Dyspnea  0 0    VO2 Peak 8.6 11.39    Symptoms No No    Resting HR 75 bpm 76 bpm    Resting BP 116/80 104/62    Resting Oxygen Saturation  98 % --    Exercise Oxygen Saturation  during 6 min walk 98 % --    Max Ex. HR 90 bpm 100 bpm    Max Ex. BP 110/70 124/66    2 Minute Post BP 120/80 104/66             Psychological, QOL, Others - Outcomes: PHQ 2/9:    03/08/2023    1:53 PM 11/29/2022   12:08 PM  Depression screen PHQ 2/9  Decreased Interest 0 1  Down, Depressed, Hopeless 0 0  PHQ - 2 Score 0 1  Altered sleeping 0 1  Tired, decreased energy 0 1   Change in appetite 0 0  Feeling bad or failure about yourself  0 0  Trouble concentrating 0 2  Moving slowly or fidgety/restless 0 2  Suicidal thoughts 0 0  PHQ-9 Score 0 7  Difficult doing work/chores  Somewhat difficult    Quality of Life:  Quality of Life - 03/08/23 1424       Quality of Life   Select Quality of Life      Quality of Life Scores   Health/Function Pre 21 %    Health/Function Post 27.21 %    Health/Function % Change 29.57 %    Socioeconomic Pre 22.14 %    Socioeconomic Post 28.71 %    Socioeconomic % Change  29.67 %    Psych/Spiritual Pre 24 %    Psych/Spiritual Post 27.21 %    Psych/Spiritual % Change 13.38 %    Family Pre 20.5 %    Family Post 24 %    Family % Change 17.07 %    GLOBAL Pre 21.78 %    GLOBAL Post 27.05 %    GLOBAL % Change 24.2 %             Personal Goals: Goals established at orientation with interventions provided to work toward goal.  Personal Goals and Risk Factors at Admission - 11/29/22 1234       Core Components/Risk Factors/Patient Goals on Admission   Hypertension Yes    Intervention Provide education on lifestyle modifcations including regular physical activity/exercise, weight management, moderate sodium restriction and increased consumption of fresh fruit, vegetables, and low fat dairy, alcohol moderation, and smoking cessation.;Monitor prescription use compliance.    Expected Outcomes Short Term: Continued assessment and intervention until BP is < 140/74mm HG in hypertensive participants. < 130/87mm HG in hypertensive participants with diabetes, heart failure or  chronic kidney disease.;Long Term: Maintenance of blood pressure at goal levels.    Lipids Yes    Intervention Provide education and support for participant on nutrition & aerobic/resistive exercise along with prescribed medications to achieve LDL 70mg , HDL >40mg .    Expected Outcomes Short Term: Participant states understanding of desired cholesterol values  and is compliant with medications prescribed. Participant is following exercise prescription and nutrition guidelines.;Long Term: Cholesterol controlled with medications as prescribed, with individualized exercise RX and with personalized nutrition plan. Value goals: LDL < 70mg , HDL > 40 mg.              Personal Goals Discharge:  Goals and Risk Factor Review     Row Name 01/29/23 1632 02/26/23 1537           Core Components/Risk Factors/Patient Goals Review   Personal Goals Review Weight Management/Obesity;Hypertension;Lipids Weight Management/Obesity;Hypertension;Lipids      Review Theodore Patton is doing well with exercise at cardiac rehab. Vital signs have been stalbe. Theodore Patton has lost 2.4 since startting cardiac rehab and reports feeling much stonger Theodore Patton is doing well with exercise at cardiac rehab. Vital signs have been stable. Theodore Patton has lost 3.0  since startting cardiac rehab and reports feeling much stonger      Expected Outcomes Theodore Patton will continue to participate in cardiac rehab for exercise, nutrition and lifestyle modifications Theodore Patton will continue to participate in cardiac rehab for exercise, nutrition and lifestyle modifications               Exercise Goals and Review:  Exercise Goals     Row Name 11/29/22 1214             Exercise Goals   Increase Physical Activity Yes       Intervention Provide advice, education, support and counseling about physical activity/exercise needs.;Develop an individualized exercise prescription for aerobic and resistive training based on initial evaluation findings, risk stratification, comorbidities and participant's personal goals.       Expected Outcomes Short Term: Attend rehab on a regular basis to increase amount of physical activity.;Long Term: Add in home exercise to make exercise part of routine and to increase amount of physical activity.;Long Term: Exercising regularly at least 3-5 days a week.       Increase Strength and Stamina Yes        Intervention Provide advice, education, support and counseling about physical activity/exercise needs.;Develop an individualized exercise prescription for aerobic and resistive training based on initial evaluation findings, risk stratification, comorbidities and participant's personal goals.       Expected Outcomes Short Term: Increase workloads from initial exercise prescription for resistance, speed, and METs.;Short Term: Perform resistance training exercises routinely during rehab and add in resistance training at home;Long Term: Improve cardiorespiratory fitness, muscular endurance and strength as measured by increased METs and functional capacity ( )       Able to understand and use rate of perceived exertion (RPE) scale Yes       Intervention Provide education and explanation on how to use RPE scale       Expected Outcomes Short Term: Able to use RPE daily in rehab to express subjective intensity level;Long Term:  Able to use RPE to guide intensity level when exercising independently       Knowledge and understanding of Target Heart Rate Range (THRR) Yes       Intervention Provide education and explanation of THRR including how the numbers were predicted and where they are located for reference  Expected Outcomes Short Term: Able to state/look up THRR;Short Term: Able to use daily as guideline for intensity in rehab;Long Term: Able to use THRR to govern intensity when exercising independently       Understanding of Exercise Prescription Yes       Intervention Provide education, explanation, and written materials on patient's individual exercise prescription       Expected Outcomes Short Term: Able to explain program exercise prescription;Long Term: Able to explain home exercise prescription to exercise independently                Exercise Goals Re-Evaluation:  Exercise Goals Re-Evaluation     Row Name 12/05/22 1420 01/16/23 1407 02/13/23 1539 02/27/23 1302 03/08/23 1431      Exercise Goal Re-Evaluation   Exercise Goals Review Increase Physical Activity;Understanding of Exercise Prescription;Increase Strength and Stamina;Knowledge and understanding of Target Heart Rate Range (THRR);Able to understand and use rate of perceived exertion (RPE) scale Increase Physical Activity;Understanding of Exercise Prescription;Increase Strength and Stamina;Knowledge and understanding of Target Heart Rate Range (THRR);Able to understand and use rate of perceived exertion (RPE) scale Increase Physical Activity;Understanding of Exercise Prescription;Increase Strength and Stamina;Knowledge and understanding of Target Heart Rate Range (THRR);Able to understand and use rate of perceived exertion (RPE) scale Increase Physical Activity;Understanding of Exercise Prescription;Increase Strength and Stamina;Knowledge and understanding of Target Heart Rate Range (THRR);Able to understand and use rate of perceived exertion (RPE) scale Increase Physical Activity;Understanding of Exercise Prescription;Increase Strength and Stamina;Knowledge and understanding of Target Heart Rate Range (THRR);Able to understand and use rate of perceived exertion (RPE) scale   Comments Pt's first day in the CRP2 program. Pt understands the exercise Rx, REP scale and THRR. Reviewed METs and goals. Pt has peak METs of 3.2. Pt voices he is making progress on his goals. Pt voices improvement in upperbody and leg strength, increased stamia, endurance and energy. Pt plans to join a gym. Will discuss home exercise with hime soon. Reviewed METs and goals. Pt has peak METs of 3.6. Pt voices he is making progress on his goals. Pt continues to voice improvement in upperbody and leg strength, increased stamia, endurance and energy. Pt plans to join a gym, possibly [employer] or [employer]. Reviewed METs and goals with Theodore Patton today. He expressed interest in learning how to setup the Octane recumbent elliptical because they have that machine at the gym where  he plans to continue his exercise after cardiac rehab. I instructed him on equipment setup and he verbalizes understanding. He is interested in using the REL here if there's one available. He plans to exercise at the gym 30 minutes at least 3 days/week and use his elliptical machine at home 2 days/week. His goal is at least 30 minutes 5 days/week. He may try going to the gym on Tuesdays/ Thursdays on the days he doesn't attend cardiac rehab. Pt last day in CRP2 program. Pt achieved a peak MET level of 3.0 and completed 35 exercise sessions. Pt showed excellent improvement in by 25.71% and was able to complete without using walker. Pt showed improvements on metrics in grip strength, balance, and flexibility. Overall pt is very pleased with outcome of program and plans to continue exercising by going to the [facility] or walking at home 3-5 days a week for 30 minutes a day.   Expected Outcomes Will continue to monitor patient and progress exercise workloads as tolerated. Will continue to monitor patient and progress exercise workloads as tolerated. Will continue to monitor patient and progress  exercise workloads as tolerated. Will switch to REL if available. Continue to progress workoads as tolerated. Pt will continue to exercise on his own safely using knowledge learned in CRP2.            Nutrition & Weight - Outcomes:  Pre Biometrics - 11/29/22 1055       Pre Biometrics   Waist Circumference 40.5 inches    Hip Circumference 40 inches    Waist to Hip Ratio 1.01 %    Triceps Skinfold 12 mm    Grip Strength 42 kg    Flexibility 0 in   could not reach   Single Leg Stand 2 seconds             Post Biometrics - 03/08/23 1427        Post  Biometrics   Height 5' 11.75" (1.822 m)    Weight 83 kg    Waist Circumference 39.5 inches    Hip Circumference 40.25 inches    Waist to Hip Ratio 0.98 %    BMI (Calculated) 25    Triceps Skinfold 12 mm    % Body Fat 25.4 %    Grip Strength 48 kg     Flexibility 12 in    Single Leg Stand 7.41 seconds             Nutrition:  Nutrition Therapy & Goals - 02/25/23 1402       Nutrition Therapy   Diet Heart Healthy Diet    Drug/Food Interactions Statins/Certain Fruits      Personal Nutrition Goals   Nutrition Goal Patient to identify strategies for reducing cardiovascular risk by attending the [employer] education and nutrition series weekly.   goal in action.   Personal Goal #2 Patient to improve diet quality by using the plate method as a guide for meal planning to include lean protein/plant protein, fruits, vegetables, whole grains, nonfat dairy as part of a well-balanced diet.   goal in action.   Personal Goal #3 Patient to reduce sodium to 1500mg  per day   goal in action.   Personal Goal #4 Patient to identify strategies for weight maintenance and weight gain of 0.5-2.0# as needed.   goal in not met.   Comments Goals in action. Theodore Patton and his wife continue to attend the [employer] education and nutrition series regularly. They have started making many dietary changes including implemented many Pritikin recipes, increased dietary fiber, and reduced saturated fat intake. Theodore Patton does report history of unwanted weight loss since shoulder surgery 03/2022 and cardiac surgery in 10/2022; his weight prior to first surgery was 196# per documentation on 02/12/2022. He is motivated to maintain his current weight and possibly gain a little weight back. We have discussed multiple strategies for weight maintenance/weight gain including calorie density, increasing portion sizes of fat/protein/carbohydrates and increasing eating frequency. He is down 6.6# since starting with our program; BMI remains appropriate for age. He does report normal appetite. Patient will benefit from participation in intensive cardiac rehab for nutrition, exercise, and lifestyle modification.      Intervention Plan   Intervention Prescribe, educate and counsel regarding individualized  specific dietary modifications aiming towards targeted core components such as weight, hypertension, lipid management, diabetes, heart failure and other comorbidities.;Nutrition handout(s) given to patient.    Expected Outcomes Short Term Goal: Understand basic principles of dietary content, such as calories, fat, sodium, cholesterol and nutrients.;Long Term Goal: Adherence to prescribed nutrition plan.  Nutrition Discharge:  Nutrition Assessments - 03/08/23 1612       Rate Your Plate Scores   Pre Score 58    Post Score 80             Education Questionnaire Score:  Knowledge Questionnaire Score - 03/08/23 1421       Knowledge Questionnaire Score   Pre Score 20/24    Post Score 20/24             Goals reviewed with patient; copy given to patient.Pt graduates from  Intensive/Traditional cardiac rehab program on 03/08/23  with completion of  35  exercise and  35 education sessions. Pt maintained good attendance and progressed nicely during their participation in rehab as evidenced by increased MET level.  Theodore Patton increased his distance on his post exercise walk test by 317 feet and lost 3.4 kg.  Medication list reconciled. Repeat  PHQ score- 0  .  Pt has made significant lifestyle changes and should be commended for their success. Theodore Patton  achieved their goals during cardiac rehab.   Pt plans to continue exercise at the [facility] and walking. Theodore Patton says that he feels stronger since participating in cardiac rehab. We are proud of Theodore Patton's progress!Thayer Headings RN BSN

## 2023-03-15 ENCOUNTER — Telehealth: Payer: Self-pay

## 2023-03-15 NOTE — Telephone Encounter (Signed)
Call to patient to advise that Dr. Mayford Knife is asking that Patient talk with the MD that prescribed his Flomax as that can cause low BP - would recommend stopping it if ok with who prescribed it. At this time, patient declines to stop flomax. He says he has been eating more regularly, drinking adequate fluids per cardiac rehab guidance and he denies any lightheadedness/dizziness/ syncope or near syncope at this time. However, Patient agrees to send BPs twice daily for a week to monitor BP to acquire more data before he talks with Dr. Felipa Eth (PCP) who prescribed it.

## 2023-03-15 NOTE — Telephone Encounter (Signed)
-----   Message from Armanda Magic sent at 03/07/2023 10:54 AM EDT ----- Patient needs to talk with the MD that prescribed his Flomax as that can cause low BP - would recommend stopping it if ok with who prescribed it.  Please send BPs twice daily for a week ----- Message ----- From: Cammy Copa, RN Sent: 03/07/2023   8:51 AM EDT To: Quintella Reichert, MD  Good morning Dr Mayford Knife, Please review attached vital sings form Theodore Patton. Theodore Patton has had some resting systolic BP's in the 90's. He is not on any BP meds at this this time. Theodore Patton is asymptomatic.  Theodore Patton has lost 4.4 kg  since starting cardiac rehab and Theodore Patton has made dietary changes since having open heart surgery in May.  Theodore Patton will complete cardiac rehab on 03/11/23. I told him to make sure he is hydrating properly.  Thank you, Sincerely [facility]  Cardiac Rehab

## 2023-03-21 ENCOUNTER — Telehealth: Payer: Self-pay

## 2023-03-21 NOTE — Telephone Encounter (Signed)
-----   Message from Armanda Magic sent at 03/07/2023 10:54 AM EDT ----- Patient needs to talk with the MD that prescribed his Flomax as that can cause low BP - would recommend stopping it if ok with who prescribed it.  Please send BPs twice daily for a week ----- Message ----- From: Cammy Copa, RN Sent: 03/07/2023   8:51 AM EDT To: Quintella Reichert, MD  Good morning Dr Mayford Knife, Please review attached vital sings form Mr [employer]. Tammy Sours has had some resting systolic BP's in the 90's. He is not on any BP meds at this this time. Tammy Sours is asymptomatic.  Tammy Sours has lost 4.4 kg  since starting cardiac rehab and Tammy Sours has made dietary changes since having open heart surgery in May.  Tammy Sours will complete cardiac rehab on 03/11/23. I told him to make sure he is hydrating properly.  Thank you, Sincerely [facility]  Cardiac Rehab

## 2023-03-21 NOTE — Telephone Encounter (Signed)
Patient submitted the following BP log:  03/15/23:  113/73 bedtime  03/16/23: 108/70 noon 110/73 bedtime  03/17/23: 103/68 morning 99/68 noon 121/80 bedtime  03/18/23: 104/62 morning 97/61 bedtime  03/19/23:  115/68 morning 109/70 noon 106/67 bedtime  03/20/23: 112/68 bedtime  03/21/23: 122/686 morning  Patient denies dizziness or lightheadedness over the last week.

## 2023-03-26 NOTE — Telephone Encounter (Signed)
Called patient to advise that per Dr. Mayford Knife BPs are okay if he is not having any symptoms. Provided education that if he starts having problems with dizziness or syncope he needs to call back and we may have to put him on some low-dose midodrine. Patient verbalizes understanding.

## 2023-04-22 ENCOUNTER — Encounter: Payer: Self-pay | Admitting: Cardiology

## 2023-07-11 ENCOUNTER — Other Ambulatory Visit: Payer: Self-pay | Admitting: *Deleted

## 2023-07-11 ENCOUNTER — Ambulatory Visit: Payer: Medicare Other | Attending: Cardiology | Admitting: Cardiology

## 2023-07-11 ENCOUNTER — Encounter: Payer: Self-pay | Admitting: Cardiology

## 2023-07-11 VITALS — BP 118/82 | HR 77 | Ht 71.0 in | Wt 181.4 lb

## 2023-07-11 DIAGNOSIS — I9789 Other postprocedural complications and disorders of the circulatory system, not elsewhere classified: Secondary | ICD-10-CM

## 2023-07-11 DIAGNOSIS — E785 Hyperlipidemia, unspecified: Secondary | ICD-10-CM

## 2023-07-11 DIAGNOSIS — I420 Dilated cardiomyopathy: Secondary | ICD-10-CM | POA: Insufficient documentation

## 2023-07-11 DIAGNOSIS — Z952 Presence of prosthetic heart valve: Secondary | ICD-10-CM

## 2023-07-11 DIAGNOSIS — Z79899 Other long term (current) drug therapy: Secondary | ICD-10-CM

## 2023-07-11 DIAGNOSIS — I4891 Unspecified atrial fibrillation: Secondary | ICD-10-CM

## 2023-07-11 DIAGNOSIS — I77810 Thoracic aortic ectasia: Secondary | ICD-10-CM

## 2023-07-11 DIAGNOSIS — I2510 Atherosclerotic heart disease of native coronary artery without angina pectoris: Secondary | ICD-10-CM

## 2023-07-11 DIAGNOSIS — I10 Essential (primary) hypertension: Secondary | ICD-10-CM

## 2023-07-11 MED ORDER — LOSARTAN POTASSIUM 25 MG PO TABS: 12.5000 mg | ORAL_TABLET | Freq: Every day | ORAL | 3 refills | Status: DC

## 2023-07-11 NOTE — Patient Instructions (Signed)
 Medication Instructions:  Please start Losartan  25 mg - take 1/2 tablet daily. Continue all other medications as listed.  *If you need a refill on your cardiac medications before your next appointment, please call your pharmacy*   Lab Work: Please have blood work in 2 weeks (BMP) at your closest [place].  If you have labs (blood work) drawn today and your tests are completely normal, you will receive your results only by: MyChart Message (if you have MyChart) OR A paper copy in the mail If you have any lab test that is abnormal or we need to change your treatment, we will call you to review the results.   Follow-Up: At [facility], you and your health needs are our priority.  As part of our continuing mission to provide you with exceptional heart care, we have created designated Provider Care Teams.  These Care Teams include your primary Cardiologist (physician) and Advanced Practice Providers (APPs -  Physician Assistants and Nurse Practitioners) who all work together to provide you with the care you need, when you need it.  We recommend signing up for the patient portal called MyChart.  Sign up information is provided on this After Visit Summary.  MyChart is used to connect with patients for Virtual Visits (Telemedicine).  Patients are able to view lab/test results, encounter notes, upcoming appointments, etc.  Non-urgent messages can be sent to your provider as well.   To learn more about what you can do with MyChart, go to [email or web address].    Your next appointment:   2 week(s)  Provider:   Orren Fabry, PA-C, Dayna Dunn, PA-C, Jackee Alberts, NP, Olivia Pavy, PA-C, Rosaline Bane, NP, Glendia Ferrier, PA-C, or Artist Pouch, PA-C     Then, Wilbert Bihari, MD will plan to see you again in 3 month(s).

## 2023-07-11 NOTE — Addendum Note (Signed)
 Addended by: Thaddeus Filippo on: 07/11/2023 11:14 AM   Modules accepted: Orders

## 2023-07-11 NOTE — Progress Notes (Signed)
 Date:  07/11/2023   ID:  Theodore Patton, DOB 1945-11-16, MRN [id number] The patient was identified using 2 identifiers. PCP:  Avva, Ravisankar, MD   [facility] Providers Cardiologist:  Wilbert Bihari, MD     Evaluation Performed:  Follow-Up Visit  Chief Complaint: AI s/p AVR, CAD s/p CABG LIMA>LAD  History of Present Illness:    Theodore Patton is a 78 y.o. male with  a past medical history significant for bicuspid aortic valve with mild aortic stenosis  and mildly dilated ascending aorta at 39 mm.  He also has a history of GERD and arthritis.  He had 2D echo 01/20/2021 showed normal LV function with EF 60 to 65% with mild LVH, grade 1 diastolic dysfunction with trileaflet aortic valve and calcification of the right coronary cusp leading to mild miscoaptation and an eccentric jet of AI with moderate AI.  There was no evidence of aortic valve stenosis.  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  There was a highly eccentric ejection jet directed the anterior mitral valve leaflet with a vena contract of 6 mm and regurgitant volume 104 cc and regurgitant fraction 68% with ERO 0.56 cm and pressure half-time of AR 258.    Seen on 02/12/2022 for cardiac risk assessment for surgery.  TTE 01/02/2022 with LVEF 60 to 65%, grade 1 DD, restricted motion of the right coronary cusp.  Aortic valve is tricuspid, aortic valve regurgitation is moderate to severe, moderate dilatation of the aortic root measuring 43 mm, mild dilatation of the ascending aorta measuring 38 mm.  Plan for repeat echocardiogram in 6 months.   Repeat echocardiogram 07/26/2022 with LVEF 60 to 65%.  Severe AI and mildly dilated aortic root at 41 mm.  He underwent TEE 08/24/22 which revealed normal LVEF 65 to 70%, no RWMA, normal RV, mildly dilated LA with no LA/LAA appendage thrombus, mild MR, mild prolapse of  the left coronary cusp and dilatation of the aortic annulus, aortic valve is tricuspid with mild calcification and mild thickening, AI is severe, AV sclerosis is present with no evidence of stenosis, mild dilatation of the aortic root measuring 41 mm mild (grade II) layered plaque involving the descending aorta.  He underwent cardiac catheterization on 09/28/2022 which revealed moderate to severe multivessel disease consisting of LAD, circumflex, and right coronary artery lesions.     Seen by Dr. Lucas, cardiac surgeon, on 10/10/22 with recommendation to undergo CABG and aortic valve replacement using a bioprosthetic valve.    He presented to [facility] on 10/18/2022 and underwent CABG x 1 utilizing LIMA to LAD and aortic valve replacement utilizing 27 mm Inspiris aortic valve as well as endoscopic harvest of the right greater saphenous vein which was too small to be used as a graft.  Repeat echo 7/24 showed  EF 45-50% with mid to apical septal and apica HK with improved EF from 40-45% in June with stable AVR and mean AVG and Vmax 1.16m/s.  Aortic root was 40mm and ascending aorta 41mm.  Repeat echo 02/2023 showed EF 45-50% with normal function of AVR.  He is here today for followup and is doing well.  He denies any chest pain or pressure, SOB, DOE, PND, orthopnea, LE edema, dizziness, palpitations or syncope. He is compliant with his meds and is tolerating meds with no SE.    Past Medical History:  Diagnosis Date   Aortic regurgitation 01/07/2019   severe and eccentric by TEE 32024 with RF 69%..  S/P 27 mm Inspiris aortic valve 10/2022   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, s/p AVR for severe AR 5/2024c   Arthritis    CAD (coronary artery disease), native coronary artery    moderate to severe multivessel disease consisting of LAD, circumflex, and right coronary artery lesions. S/P LIMA>LAD 10/2022   Complication of anesthesia    DCM (dilated  cardiomyopathy) (HCC)    EF 45-50% by echo 02/2023   GERD (gastroesophageal reflux disease)    Gout    Hyperlipemia    PONV (postoperative nausea and vomiting)    Primary localized osteoarthritis of right knee 04/14/2019   Primary localized osteoarthrosis of right shoulder 07/24/2016   Past Surgical History:  Procedure Laterality Date   AORTIC VALVE REPLACEMENT N/A 10/18/2022   Procedure: AORTIC VALVE REPLACEMENT (AVR) USING INSPIRIS AORTIC VALVE SIZE ;  Surgeon: Lucas Dorise POUR, MD;  Location: [facility] OR;  Service: Open Heart Surgery;  Laterality: N/A;   COLONOSCOPY     CORONARY ARTERY BYPASS GRAFT N/A 10/18/2022   Procedure: CORONARY ARTERY BYPASS GRAFTING (CABG) X 1 USING LEFT INTERNAL MAMMARY  ARTERY AND ENDOSCOPICALLY HARVESTED LEFT GREATER SAPHENOUS VEIN;  Surgeon: Lucas Dorise POUR, MD;  Location: [facility] OR;  Service: Open Heart Surgery;  Laterality: N/A;   PARTIAL KNEE ARTHROPLASTY Right 04/14/2019   Procedure: UNICOMPARTMENTAL KNEE;  Surgeon: Josefina Chew, MD;  Location: [facility] ORS;  Service: Orthopedics;  Laterality: Right;   PILONIDAL CYST EXCISION     RIGHT/LEFT HEART CATH AND CORONARY ANGIOGRAPHY N/A 09/28/2022   Procedure: RIGHT/LEFT HEART CATH AND CORONARY ANGIOGRAPHY;  Surgeon: Wendel Lurena POUR, MD;  Location: [facility] INVASIVE CV LAB;  Service: Cardiovascular;  Laterality: N/A;   SHOULDER ARTHROSCOPY Right 2008   TEE WITHOUT CARDIOVERSION N/A 08/24/2022   Procedure: TRANSESOPHAGEAL ECHOCARDIOGRAM (TEE);  Surgeon: Francyne Headland, MD;  Location: [facility] ENDOSCOPY;  Service: Cardiovascular;  Laterality: N/A;   TEE WITHOUT CARDIOVERSION N/A 10/18/2022   Procedure: TRANSESOPHAGEAL ECHOCARDIOGRAM;  Surgeon: Lucas Dorise POUR, MD;  Location: [facility] OR;  Service: Open Heart Surgery;  Laterality: N/A;   TOTAL SHOULDER ARTHROPLASTY Right 07/24/2016   Procedure: TOTAL SHOULDER ARTHROPLASTY;  Surgeon: Chew Josefina, MD;  Location: [facility] OR;  Service: Orthopedics;  Laterality: Right;   TOTAL SHOULDER ARTHROPLASTY Left  03/06/2022   Procedure: TOTAL SHOULDER ARTHROPLASTY;  Surgeon: Josefina Chew, MD;  Location: [facility] ORS;  Service: Orthopedics;  Laterality: Left;   UPPER GI ENDOSCOPY       Current Meds  Medication Sig   acetaminophen  (TYLENOL ) 650 MG CR tablet Take 1,300 mg by mouth 2 (two) times daily.   allopurinol  (ZYLOPRIM ) 300 MG tablet Take 300 mg by mouth in the morning.   aspirin  EC 81 MG tablet Take 1 tablet (81 mg total) by mouth daily. Swallow whole.   buPROPion  (WELLBUTRIN  XL) 150 MG 24 hr tablet Take 300 mg by mouth in the morning.   cetirizine (ZYRTEC) 10 MG tablet Take 10 mg by mouth at bedtime.   colchicine  0.6 MG tablet Take 0.6 mg by mouth every 6 (six) hours as needed (gout flares).    famotidine  (PEPCID ) 20 MG tablet Take 20 mg by mouth at bedtime.   fluticasone  (FLONASE ) 50 MCG/ACT nasal spray Place 1-2 sprays into both nostrils at bedtime as needed for allergies.   gabapentin  (NEURONTIN ) 300 MG capsule Take 600 mg  by mouth 3 (three) times daily.   Glucosamine-Chondroitin (COSAMIN DS PO) Take 1 tablet by mouth 2 (two) times daily. Triple Strength   Multiple Vitamin (MULTIVITAMIN WITH MINERALS) TABS tablet Take 1 tablet by mouth daily. Complete Multivitamin   Probiotic Product (ALIGN) 4 MG CAPS Take 4 mg by mouth daily as needed (gut health).   rosuvastatin  (CRESTOR ) 10 MG tablet Take 10 mg by mouth in the morning.   SALINE NASAL MIST NA Place 1 spray into the nose 3 (three) times daily as needed (congestion).   tamsulosin  (FLOMAX ) 0.4 MG CAPS capsule Take 0.4 mg by mouth every evening.   TURMERIC PO Take 1,000 mg by mouth in the morning and at bedtime.     Allergies:   Sulfa antibiotics   Social History   Tobacco Use   Smoking status: Former    Current packs/day: 0.00    Types: Cigarettes    Quit date: 07/12/1980    Years since quitting: 43.0   Smokeless tobacco: Never  Vaping Use   Vaping status: Never Used  Substance Use Topics   Alcohol  use: Yes    Alcohol /week: 1.0  standard drink of alcohol     Types: 1 Glasses of wine per week    Comment: occ   Drug use: No     Family Hx: The patient's family history includes Healthy in his sister; Heart Problems in his sister; Liver cancer in his brother.  ROS:   Please see the history of present illness.     All other systems reviewed and are negative.   Prior CV studies:   The following studies were reviewed today:  TEE 08/2022 IMPRESSIONS    1. Left ventricular ejection fraction, by estimation, is 65 to 70%. The  left ventricle has normal function. The left ventricle has no regional  wall motion abnormalities. The left ventricular internal cavity size was  mildly dilated.   2. Right ventricular systolic function is normal. The right ventricular  size is normal. Tricuspid regurgitation signal is inadequate for assessing  PA pressure.   3. Left atrial size was mildly dilated. No left atrial/left atrial  appendage thrombus was detected.   4. The mitral valve is normal in structure. Mild mitral valve  regurgitation.   5. 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 The aortic ejection jet is highly eccentric,  directed at the anterior mitral leaflet. The AR vena contracta is 6 mm.  The regurgitant volume is 104 ml,  regurgitant fraction is 68%. The 3D guided ERO area is 0.56 cm sq. The  pressure half time of the AR jet is 258 ms. There is holodiastolic  reversal of flow in the descending aorta. The aortic valve is tricuspid.  There is mild calcification of the aortic  valve. There is mild thickening of the aortic valve. Aortic valve  regurgitation is severe. Aortic valve sclerosis is present, with no  evidence of aortic valve stenosis.   6. The aortic annulus is dilated (3.2 cm) and the sinuses of Valsalva are  mildly dilated (4.1 cm) , but the remainder of the aorta is normal in  caliber. Aortic dilatation noted. There is mild dilatation of the  aortic  root, measuring 41 mm. There is mild   (Grade II) layered plaque involving the descending aorta.   Labs/Other Tests and Data Reviewed:    EKG:  No ECG reviewed.  Recent Labs: 10/20/2022: ALT 13 10/21/2022: Magnesium  2.2 10/23/2022:  BUN 27; Creatinine, Ser 1.14; Potassium 3.8; Sodium 134 11/02/2022: Hemoglobin 9.7; Platelets 271   Recent Lipid Panel Lab Results  Component Value Date/Time   CHOL 90 (L) 11/02/2022 10:55 AM   TRIG 113 11/02/2022 10:55 AM   HDL 37 (L) 11/02/2022 10:55 AM   CHOLHDL 2.4 11/02/2022 10:55 AM   LDLCALC 32 11/02/2022 10:55 AM    Wt Readings from Last 3 Encounters:  07/11/23 181 lb 6.4 oz (82.3 kg)  03/08/23 182 lb 15.7 oz (83 kg)  01/31/23 184 lb 3.2 oz (83.6 kg)     Risk Assessment/Calculations:          Objective:    Vital Signs:  BP 118/82   Pulse 77   Ht 5' 11 (1.803 m)   Wt 181 lb 6.4 oz (82.3 kg)   SpO2 98%   BMI 25.30 kg/m   GEN: Well nourished, well developed in no acute distress HEENT: Normal NECK: No JVD; No carotid bruits LYMPHATICS: No lymphadenopathy CARDIAC:RRR, no murmurs, rubs, gallops RESPIRATORY:  Clear to auscultation without rales, wheezing or rhonchi  ABDOMEN: Soft, non-tender, non-distended MUSCULOSKELETAL:  No edema; No deformity  SKIN: Warm and dry NEUROLOGIC:  Alert and oriented x 3 PSYCHIATRIC:  Normal affect  ASSESSMENT & PLAN:    Aortic valve disease -This was previously felt to be a bicuspid aortic valve and in the past mild AS  -Status post bioprosthetic AVR 10/22/2022 for severe AR -2D echo 02/22/2023 showed EF 45 to 50%, G1 DD, mild RV dysfunction, stable bioprosthetic AVR with mean AV R gradient 6 mmHg with no perivalvular regurgitation and mildly dilated aortic root at 42 mm.  Aortic root and ascending aortic dilatation -Chest CTA 02/2021 showed 41mm -repeat Chest CTA 01/2022 demonstrated no aortic aneurysm.  The aorta at the sinus of Valsalva was 32 mm x 34 x 33 mm unchanged, STJ 35 mm unchanged,  ascending aorta 36 mm unchanged and aortic arch 35 mm unchanged. -2D echo 02/2023 with ascending aorta 42mm   ASCAD -Cardiac cath 09/28/2022 for workup for AVR revealed proximal LAD lesion 20%, mid LAD-1 lesion 50% and mid LAD-2 lesion 50%, mid Cx lesion 60% and proximal RCA lesion 70%.  -Status post CABG x 1 with LIMA to the LAD at the time of AVR 10/2022 -He has not had any anginal symptoms since I saw him last -Continue prescription drug management with aspirin  81 mg daily, Crestor  10 mg daily  Hyperlipidemia -LDL goal less than 70 -I have personally reviewed and interpreted outside labs performed by patient's PCP which showed LDL 41, HDL 34 and triglycerides 125 on 12/31/2022 and ALT 13 on 06/10/2023 -Continue prescription drug management with Crestor  10 mg daily with as needed refills  Postop atrial fibrillation -He was transiently on amiodarone  postop but this is now been stopped -He has not had any palpitations and is maintaining sinus rhythm on exam today  Hypertension -BP adequate controlled on exam -He has not required any antihypertensive therapy since his surgery  DCM -EF was normal prior to AVR -EF initially post op was 40-45% initially and improved to 45-50% on echo July 2024 -Repeat echo 02/22/2023 showed persistent EF 45 to 50% with mild RV dysfunction -GDMT has been limited by soft BP in the past but is good today -will start Losartan  12.5mg  daily -He appears euvolemic on exam -I have asked him to check his BP daily for a week and call with results -followup with extender in 2 weeks for uptitration of GDMT and BMET  Followup with me in 3 months   Medication Adjustments/Labs and Tests Ordered: Current medicines are reviewed at length with the patient today.  Concerns regarding medicines are outlined above.   Tests Ordered: No orders of the defined types were placed in this encounter.   Medication Changes: No orders of the defined types were placed in this  encounter.   Follow Up:  In Personin 6 months  Signed, Wilbert Bihari, MD  07/11/2023 11:01 AM    [facility]

## 2023-07-29 ENCOUNTER — Ambulatory Visit: Payer: Medicare Other | Attending: Physician Assistant | Admitting: Emergency Medicine

## 2023-07-29 ENCOUNTER — Encounter: Payer: Self-pay | Admitting: Physician Assistant

## 2023-07-29 VITALS — BP 102/58 | HR 74 | Ht 73.0 in | Wt 178.8 lb

## 2023-07-29 DIAGNOSIS — I77810 Thoracic aortic ectasia: Secondary | ICD-10-CM | POA: Diagnosis not present

## 2023-07-29 DIAGNOSIS — E785 Hyperlipidemia, unspecified: Secondary | ICD-10-CM

## 2023-07-29 DIAGNOSIS — Z952 Presence of prosthetic heart valve: Secondary | ICD-10-CM

## 2023-07-29 DIAGNOSIS — I420 Dilated cardiomyopathy: Secondary | ICD-10-CM | POA: Diagnosis not present

## 2023-07-29 DIAGNOSIS — Z951 Presence of aortocoronary bypass graft: Secondary | ICD-10-CM

## 2023-07-29 DIAGNOSIS — I2510 Atherosclerotic heart disease of native coronary artery without angina pectoris: Secondary | ICD-10-CM

## 2023-07-29 NOTE — Progress Notes (Signed)
 Cardiology Office Note:    Date:  07/29/2023  ID:  Theodore Patton, DOB 01-08-1946, MRN [id number] PCP: Chilton Greathouse, MD  [facility] Providers Cardiologist:  Armanda Magic, MD       Patient Profile:      Theodore Patton is a 78 y.o. 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   Seen on 02/12/2022 for cardiac risk assessment for surgery.  TTE 01/02/2022 with LVEF 60 to 65%, grade 1 DD, restricted motion of the right coronary cusp.  Aortic valve is tricuspid, aortic valve regurgitation is moderate to severe, moderate dilatation of the aortic root measuring 43 mm, mild dilatation of the ascending aorta measuring 38 mm.  Plan for repeat echocardiogram in 6 months. Repeat echocardiogram 07/26/2022 with LVEF 60 to 65%.  Severe AI and mildly dilated aortic root at 41 mm.  Plan for follow-up TEE to evaluate aortic insufficiency further.  He underwent TEE 08/24/2022 which revealed normal LVEF 60-70%, no RWMA, normal RV, mildly dilated LA with no LA/LAA appendage thrombus, mild MR, mild prolapse of left coronary cusp and dilation of aortic annulus, aortic valve is tricuspid with mild calcification and mild thickening, AI is severe, AV sclerosis is present with no evidence of stenosis, mild dilation of aortic root measuring 41 mm.  He underwent cardiac catheterization on 09/28/2022 which revealed moderate to severe multivessel disease consisting of LAD, circumflex, RCA lesions.  Seen by Dr. Laneta Simmers, cardiac surgeon, on 10/10/2022 with recommendation to undergo CABG and aortic valve replacement.  He underwent CABG x 1 utilizing LIMA to LAD and aortic valve replacement utilizing 27 mm Inspiris aortic valve as well as endoscopic harvest of right greater saphenous vein which was too small to be used as a graft on 10/18/2022.  He had postoperative  atrial fibrillation, he was transiently on amiodarone postop but this was stopped.  Repeat echo 02/2023 showed LVEF 45-50% with normal function of AVR.  He was last seen in clinic on 07/11/2023.  He was doing well at the time.  He was started on losartan 12.5 mg daily and to follow-up in 2 weeks for up titration of GDMT.      History of Present Illness:  Discussed the use of AI scribe software for clinical note transcription with the patient, who gave verbal consent to proceed.  Theodore Patton is a 78 y.o. male who returns for 2 week follow-up for DCM.   The patient comes into clinic today with his wife. The patient was started on Losartan and has been tolerating it well, he did have an episode of dizziness/lightheadedness reported shortly after initiation of the medication. The patient's blood pressure has been stable, albeit on the lower side. The patient reports feeling significantly better post-surgery and denies any symptoms of heart failure such as weight gain, fatigue, leg swelling, DOE, orthopnea, or shortness of breath. The patient has been exercising regularly and maintaining a healthy lifestyle. He also denies any exertional angina.      Review of Systems  Constitutional: Negative for weight gain and weight loss.  Cardiovascular:  Negative for chest pain, claudication, dyspnea on exertion, irregular heartbeat, leg swelling, near-syncope, orthopnea, palpitations, paroxysmal nocturnal dyspnea and syncope.  Respiratory:  Negative for cough, hemoptysis and shortness of breath.   Gastrointestinal:  Negative for abdominal pain, hematochezia and melena.  Genitourinary:  Negative for  hematuria.  Neurological:  Negative for dizziness and light-headedness.     See HPI     Home Medications:    Prior to Admission medications   Medication Sig Start Date End Date Taking? Authorizing Provider  acetaminophen (TYLENOL) 650 MG CR tablet Take 1,300 mg by mouth 2 (two) times daily.    [provider]  allopurinol (ZYLOPRIM) 300 MG tablet Take 300 mg by mouth in the morning. 05/16/16   [provider]  aspirin EC 81 MG tablet Take 1 tablet (81 mg total) by mouth daily. Swallow whole. 09/28/22 12/22/23  Orbie Pyo, MD  buPROPion (WELLBUTRIN XL) 150 MG 24 hr tablet Take 300 mg by mouth in the morning. 10/22/21   [provider]  cetirizine (ZYRTEC) 10 MG tablet Take 10 mg by mouth at bedtime.    [provider]  colchicine 0.6 MG tablet Take 0.6 mg by mouth every 6 (six) hours as needed (gout flares).  01/26/19   [provider]  famotidine (PEPCID) 20 MG tablet Take 20 mg by mouth at bedtime.    [provider]  Fe Fum-Vit C-Vit B12-FA (TRIGELS-F FORTE) CAPS capsule Take 1 capsule by mouth daily after breakfast. May substitute with similar generic medication Patient not taking: Reported on 07/11/2023 10/24/22   Alleen Borne, MD  fluticasone (FLONASE) 50 MCG/ACT nasal spray Place 1-2 sprays into both nostrils at bedtime as needed for allergies. 06/11/16   [provider]  gabapentin (NEURONTIN) 300 MG capsule Take 600 mg by mouth 3 (three) times daily. 04/03/16   [provider]  Glucosamine-Chondroitin (COSAMIN DS PO) Take 1 tablet by mouth 2 (two) times daily. Triple Strength    [provider]  losartan (COZAAR) 25 MG tablet Take 0.5 tablets (12.5 mg total) by mouth daily. 07/11/23   Quintella Reichert, MD  Multiple Vitamin (MULTIVITAMIN WITH MINERALS) TABS tablet Take 1 tablet by mouth daily. Complete Multivitamin    [provider]  Probiotic Product (ALIGN) 4 MG CAPS Take 4 mg by mouth daily as needed (gut health).    [provider]  rosuvastatin (CRESTOR) 10 MG tablet Take 10 mg by mouth in the morning.    [provider]  SALINE NASAL MIST NA Place 1 spray into the nose 3 (three) times daily as needed (congestion).    [provider]  tamsulosin (FLOMAX) 0.4 MG CAPS capsule  Take 0.4 mg by mouth every evening. 05/16/16   [provider]  TURMERIC PO Take 1,000 mg by mouth in the morning and at bedtime.    [provider]   Studies Reviewed:       Echocardiogram 03/04/2023 1. Left ventricular ejection fraction, by estimation, is 45 to 50%. The  left ventricle has mildly decreased function. The left ventricle  demonstrates global hypokinesis with septal bounce suggestive of prior  cardiac surgery. There is mild concentric  left ventricular hypertrophy. Left ventricular diastolic parameters are  consistent with Grade I diastolic dysfunction (impaired relaxation).   2. Peak RV-RA gradient 15 mmHg. Right ventricular systolic function is  mildly reduced. The right ventricular size is normal.   3. Left atrial size was mildly dilated.   4. The mitral valve is normal in structure. Trivial mitral valve  regurgitation. No evidence of mitral stenosis.   5. S/p bioprosthetic aortic valve replacement. Mean gradient 6 mmHg with  no significant peri-valvular regurgitation. Normal bioprosthetic aortic  valve function.   6. Aortic dilatation noted. There is  mild dilatation of the aortic root,  measuring 42 mm.   7. IVC not well-visualized.   Right/Left Heart Catheterization 09/28/2022 1.  Moderate to severe multivessel disease consisting of LAD, circumflex, and right coronary artery lesions. 2.  Fick cardiac output of 6.7 L/min and Fick cardiac index of 3.1 L/min/m with the following hemodynamics:            Right atrial pressure mean of 2 mmHg            Right ventricular pressure 26/1 with a end-diastolic pressure of 4 mmHg            Wedge pressure mean of 7 mmHg            Pulmonary artery pressure of 26/5 with a mean of 10 mmHg 3.  LVEDP of 8 to 9 mmHg. Diagnostic Dominance: Right  Risk Assessment/Calculations:             Physical Exam:   VS:  BP (!) 102/58   Pulse 74   Ht 6\' 1"  (1.854 m)   Wt 178 lb 12.8 oz (81.1 kg)   SpO2 98%   BMI  23.59 kg/m    Wt Readings from Last 3 Encounters:  07/29/23 178 lb 12.8 oz (81.1 kg)  07/11/23 181 lb 6.4 oz (82.3 kg)  03/08/23 182 lb 15.7 oz (83 kg)    Constitutional:      Appearance: Normal and healthy appearance. Not in distress.  Neck:     Vascular: JVD normal.  Pulmonary:     Effort: Pulmonary effort is normal.     Breath sounds: Normal breath sounds.  Chest:     Chest wall: Not tender to palpatation.  Cardiovascular:     PMI at left midclavicular line. Normal rate. Regular rhythm. Normal S1. Normal S2.      Murmurs: There is no murmur.     No gallop.  No click. No rub.  Pulses:    Intact distal pulses.  Edema:    Peripheral edema absent.  Musculoskeletal: Normal range of motion.     Cervical back: Normal range of motion and neck supple. Skin:    General: Skin is warm and dry.  Neurological:     General: No focal deficit present.     Mental Status: Alert, oriented to person, place, and time and oriented to person, place and time.  Psychiatric:        Mood and Affect: Mood and affect normal.        Behavior: Behavior is cooperative.        Thought Content: Thought content normal.        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 Optimization limited in past due to low blood pressure  -Today he is euvolemic and well compensated, NYHA class I -He brings BP log in today showing average BP in low 100s since addition of Losartan with one episode of dizziness/lightheadedness. BP today 102/58.  -Unable to further titrate GDMT given ongoing low blood pressure readings.  -I will continue Losartan 12.5mg  daily  -BMET today. Repeat echocardiogram x3 months and f/u thereafter   Aortic insufficiency s/p AVR S/p bioprosthetic AVR on 10/22/2022 for severe  AR 2D echo 02/22/2023 showed LVEF 45-50%, grade 1 DD, mild RV dysfunction, stable  bioprosthetic AVR with mean AVR gradient 6 mmHg with no perivalvular regurgitation -Asymptomatic with no overt HF symptoms   Aortic root and ascending aortic dilation Chest CTA 02/2021 showed 41 mm epeat Chest CTA 01/2022 demonstrated no aortic aneurysm. The aorta at the sinus of Valsalva was 32 mm x 34 x 33 mm unchanged, STJ 35 mm unchanged, ascending aorta 36 mm unchanged and aortic arch 35 mm unchanged.  Echo 02/2023 with a ascending aorta 42 mm -Repeat Echo as noted above  Coronary artery disease s/p CABG x1  S/p CABG x1 with LIMA to LAD at time of AVR on 10/2022 Cardiac cath 09/28/2022 workup for AVR revealed proximal LAD lesion 20%, mid LAD-1 lesion 50% and mid LAD-2 lesion 50%, mid Cx lesion 60% and proximal RCA lesion 70%. -Today he is without any anginal symptoms, no indication for further ischemic evaluation at this time -Continue aspirin 81 mg daily, rosuvastatin 10 mg daily  Hyperlipidemia, LDL goal <55 LDL 41, HDL 34, TG 125 on 09/4008 -LDL under excellent control -Continue rosuvastatin 10 mg daily            Dispo:  Follow up in 3 months with  Dr. Mayford Knife   Signed, Denyce Robert, NP

## 2023-07-29 NOTE — Patient Instructions (Signed)
 Medication Instructions:  The current medical regimen is effective;  continue present plan and medications.  *If you need a refill on your cardiac medications before your next appointment, please call your pharmacy*  Lab Work: Please report to [facility] for bloodwork today (BMP)  If you have labs (blood work) drawn today and your tests are completely normal, you will receive your results only by: MyChart Message (if you have MyChart) OR A paper copy in the mail If you have any lab test that is abnormal or we need to change your treatment, we will call you to review the results.   Testing/Procedures: Your physician has requested that you have an echocardiogram in 3 months (needs before seeing Dr Mayford Knife in May). Echocardiography is a painless test that uses sound waves to create images of your heart. It provides your doctor with information about the size and shape of your heart and how well your heart's chambers and valves are working. This procedure takes approximately one hour. There are no restrictions for this procedure. Please do NOT wear cologne, perfume, aftershave, or lotions (deodorant is allowed). Please arrive 15 minutes prior to your appointment time.  Please note: We ask at that you not bring children with you during ultrasound (echo/ vascular) testing. Due to room size and safety concerns, children are not allowed in the ultrasound rooms during exams. Our front office staff cannot provide observation of children in our lobby area while testing is being conducted. An adult accompanying a patient to their appointment will only be allowed in the ultrasound room at the discretion of the ultrasound technician under special circumstances. We apologize for any inconvenience.    Follow-Up: At [facility], you and your health needs are our priority.  As part of our continuing mission to provide you with exceptional heart care, we have created designated Provider Care Teams.  These  Care Teams include your primary Cardiologist (physician) and Advanced Practice Providers (APPs -  Physician Assistants and Nurse Practitioners) who all work together to provide you with the care you need, when you need it.  We recommend signing up for the patient portal called "MyChart".  Sign up information is provided on this After Visit Summary.  MyChart is used to connect with patients for Virtual Visits (Telemedicine).  Patients are able to view lab/test results, encounter notes, upcoming appointments, etc.  Non-urgent messages can be sent to your provider as well.   To learn more about what you can do with MyChart, go to [email or web address].    Your next appointment:   As scheduled   Provider:   Armanda Magic, MD       1st Floor: - Lobby - Registration  - Pharmacy  - Lab - Cafe  2nd Floor: - PV Lab - Diagnostic Testing (echo, CT, nuclear med)  3rd Floor: - Vacant  4th Floor: - TCTS (cardiothoracic surgery) - AFib Clinic - Structural Heart Clinic - Vascular Surgery  - Vascular Ultrasound  5th Floor: - HeartCare Cardiology (general and EP) - Clinical Pharmacy for coumadin, hypertension, lipid, weight-loss medications, and med management appointments    Valet parking services will be available as well.

## 2023-07-30 LAB — BASIC METABOLIC PANEL
BUN/Creatinine Ratio: 27 — ABNORMAL HIGH (ref 10–24)
BUN: 30 mg/dL — ABNORMAL HIGH (ref 8–27)
CO2: 20 mmol/L (ref 20–29)
Calcium: 8.8 mg/dL (ref 8.6–10.2)
Chloride: 104 mmol/L (ref 96–106)
Creatinine, Ser: 1.1 mg/dL (ref 0.76–1.27)
Glucose: 103 mg/dL — ABNORMAL HIGH (ref 70–99)
Potassium: 4.9 mmol/L (ref 3.5–5.2)
Sodium: 139 mmol/L (ref 134–144)
eGFR: 70 mL/min/{1.73_m2} (ref >59)

## 2023-10-11 ENCOUNTER — Ambulatory Visit (HOSPITAL_COMMUNITY): Payer: Medicare Other | Attending: Emergency Medicine

## 2023-10-11 DIAGNOSIS — I340 Nonrheumatic mitral (valve) insufficiency: Secondary | ICD-10-CM

## 2023-10-11 DIAGNOSIS — I420 Dilated cardiomyopathy: Secondary | ICD-10-CM | POA: Diagnosis present

## 2023-10-11 LAB — ECHOCARDIOGRAM COMPLETE
AV Mean grad: 7.2 mmHg
AV Peak grad: 13 mmHg
Ao pk vel: 1.8 m/s
Area-P 1/2: 2.37 cm2
S' Lateral: 3.3 cm

## 2023-10-15 ENCOUNTER — Encounter: Payer: Self-pay | Admitting: Cardiology

## 2023-10-15 ENCOUNTER — Ambulatory Visit: Payer: Self-pay | Admitting: Emergency Medicine

## 2023-10-15 ENCOUNTER — Ambulatory Visit: Payer: Medicare Other | Attending: Cardiology | Admitting: Cardiology

## 2023-10-15 VITALS — BP 100/70 | HR 77 | Ht 73.0 in | Wt 172.6 lb

## 2023-10-15 DIAGNOSIS — Z952 Presence of prosthetic heart valve: Secondary | ICD-10-CM

## 2023-10-15 DIAGNOSIS — I2510 Atherosclerotic heart disease of native coronary artery without angina pectoris: Secondary | ICD-10-CM

## 2023-10-15 DIAGNOSIS — I4891 Unspecified atrial fibrillation: Secondary | ICD-10-CM

## 2023-10-15 DIAGNOSIS — E785 Hyperlipidemia, unspecified: Secondary | ICD-10-CM

## 2023-10-15 DIAGNOSIS — I9789 Other postprocedural complications and disorders of the circulatory system, not elsewhere classified: Secondary | ICD-10-CM | POA: Diagnosis not present

## 2023-10-15 DIAGNOSIS — I77810 Thoracic aortic ectasia: Secondary | ICD-10-CM | POA: Diagnosis not present

## 2023-10-15 DIAGNOSIS — I10 Essential (primary) hypertension: Secondary | ICD-10-CM | POA: Diagnosis not present

## 2023-10-15 DIAGNOSIS — I420 Dilated cardiomyopathy: Secondary | ICD-10-CM

## 2023-10-15 DIAGNOSIS — Z01818 Encounter for other preprocedural examination: Secondary | ICD-10-CM

## 2023-10-15 NOTE — Addendum Note (Signed)
 Addended by: Cherylyn Cos on: 10/15/2023 11:16 AM   Modules accepted: Orders

## 2023-10-15 NOTE — Patient Instructions (Addendum)
 Medication Instructions:  Your physician recommends that you continue on your current medications as directed. Please refer to the Current Medication list given to you today.  *If you need a refill on your cardiac medications before your next appointment, please call your pharmacy*  Lab Work: Please complete a BMET in our first floor lab before you leave today.  If you have labs (blood work) drawn today and your tests are completely normal, you will receive your results only by: MyChart Message (if you have MyChart) OR A paper copy in the mail If you have any lab test that is abnormal or we need to change your treatment, we will call you to review the results.  Testing/Procedures: Dr. Micael Adas has ordered a Gated Chest CTA  to evaluate your aorta. No preparation is needed for this test.   Follow-Up: At [facility] you and your health needs are our priority.  As part of our continuing mission to provide you with exceptional heart care, our providers are all part of one team.  This team includes your primary Cardiologist (physician) and Advanced Practice Providers or APPs (Physician Assistants and Nurse Practitioners) who all work together to provide you with the care you need, when you need it.  Your next appointment:   6 month(s)  Provider:   Gaylyn Keas, MD

## 2023-10-15 NOTE — Progress Notes (Signed)
 Date:  10/15/2023   ID:  Theodore Patton, DOB May 07, 1946, MRN [id number] The patient was identified using 2 identifiers. PCP:  Avva, Ravisankar, MD   [facility] Providers Cardiologist:  Gaylyn Keas, MD     Evaluation Performed:  Follow-Up Visit  Chief Complaint: AI s/p AVR, CAD s/p CABG LIMA>LAD  History of Present Illness:    Theodore Patton is a 78 y.o. male with  a past medical history significant for bicuspid aortic valve with mild aortic stenosis  and mildly dilated ascending aorta at 39 mm.  He also has a history of GERD and arthritis.  He had 2D echo 01/20/2021 showed normal LV function with EF 60 to 65% with mild LVH, grade 1 diastolic dysfunction with trileaflet aortic valve and calcification of the right coronary cusp leading to mild miscoaptation and an eccentric jet of AI with moderate AI.  There was no evidence of aortic valve stenosis.  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  There was a highly eccentric ejection jet directed the anterior mitral valve leaflet with a vena contract of 6 mm and regurgitant volume 104 cc and regurgitant fraction 68% with ERO 0.56 cm and pressure half-time of AR 258.    Seen on 02/12/2022 for cardiac risk assessment for surgery.  TTE 01/02/2022 with LVEF 60 to 65%, grade 1 DD, restricted motion of the right coronary cusp.  Aortic valve is tricuspid, aortic valve regurgitation is moderate to severe, moderate dilatation of the aortic root measuring 43 mm, mild dilatation of the ascending aorta measuring 38 mm.  Plan for repeat echocardiogram in 6 months.   Repeat echocardiogram 07/26/2022 with LVEF 60 to 65%.  Severe AI and mildly dilated aortic root at 41 mm.  He underwent TEE 08/24/22 which revealed normal LVEF 65 to 70%, no RWMA, normal RV, mildly dilated LA with no LA/LAA appendage thrombus, mild MR, mild prolapse of  the left coronary cusp and dilatation of the aortic annulus, aortic valve is tricuspid with mild calcification and mild thickening, AI is severe, AV sclerosis is present with no evidence of stenosis, mild dilatation of the aortic root measuring 41 mm mild (grade II) layered plaque involving the descending aorta.  He underwent cardiac catheterization on 09/28/2022 which revealed moderate to severe multivessel disease consisting of LAD, circumflex, and right coronary artery lesions.     Seen by Dr. Sherene Dilling, cardiac surgeon, on 10/10/22 with recommendation to undergo CABG and aortic valve replacement using a bioprosthetic valve.    He presented to [facility] on 10/18/2022 and underwent CABG x 1 utilizing LIMA to LAD and aortic valve replacement utilizing 27 mm Inspiris aortic valve as well as endoscopic harvest of the right greater saphenous vein which was too small to be used as a graft.  Repeat echo 7/24 showed  EF 45-50% with mid to apical septal and apica HK with improved EF from 40-45% in June with stable AVR and mean AVG and Vmax 1.23m/s.  Aortic root was 40mm and ascending aorta 41mm.  Repeat echo 02/2023 showed EF 45-50% with normal function of AVR.  Repeat 2D echo 10/11/2023 showed low normal LVF with EF 50-55% with G1DD, mild MR, normal functioning AVR with mean AVG and ascending aorta 3.9cm and aortic root 4.1cm.   He is here today for followup and is doing well.  He denies any chest  pain or pressure, SOB, DOE, PND, orthopnea, LE edema, dizziness, palpitations or syncope. He is compliant with his meds and is tolerating meds with no SE.    Past Medical History:  Diagnosis Date   Aortic regurgitation 01/07/2019   severe and eccentric by TEE 32024 with RF 69%..  S/P 27 mm Inspiris aortic valve 10/2022   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, s/p AVR for severe AR 5/2024c   Arthritis    CAD (coronary artery disease), native coronary artery     moderate to severe multivessel disease consisting of LAD, circumflex, and right coronary artery lesions. S/P LIMA>LAD 10/2022   Complication of anesthesia    DCM (dilated cardiomyopathy) (HCC)    EF 45-50% by echo 02/2023   GERD (gastroesophageal reflux disease)    Gout    Hyperlipemia    PONV (postoperative nausea and vomiting)    Primary localized osteoarthritis of right knee 04/14/2019   Primary localized osteoarthrosis of right shoulder 07/24/2016   Past Surgical History:  Procedure Laterality Date   AORTIC VALVE REPLACEMENT N/A 10/18/2022   Procedure: AORTIC VALVE REPLACEMENT (AVR) USING INSPIRIS AORTIC VALVE SIZE ;  Surgeon: Bartley Lightning, MD;  Location: [facility] OR;  Service: Open Heart Surgery;  Laterality: N/A;   COLONOSCOPY     CORONARY ARTERY BYPASS GRAFT N/A 10/18/2022   Procedure: CORONARY ARTERY BYPASS GRAFTING (CABG) X 1 USING LEFT INTERNAL MAMMARY  ARTERY AND ENDOSCOPICALLY HARVESTED LEFT GREATER SAPHENOUS VEIN;  Surgeon: Bartley Lightning, MD;  Location: [facility] OR;  Service: Open Heart Surgery;  Laterality: N/A;   PARTIAL KNEE ARTHROPLASTY Right 04/14/2019   Procedure: UNICOMPARTMENTAL KNEE;  Surgeon: Osa Blase, MD;  Location: [facility] ORS;  Service: Orthopedics;  Laterality: Right;   PILONIDAL CYST EXCISION     RIGHT/LEFT HEART CATH AND CORONARY ANGIOGRAPHY N/A 09/28/2022   Procedure: RIGHT/LEFT HEART CATH AND CORONARY ANGIOGRAPHY;  Surgeon: Kyra Phy, MD;  Location: [facility] INVASIVE CV LAB;  Service: Cardiovascular;  Laterality: N/A;   SHOULDER ARTHROSCOPY Right 2008   TEE WITHOUT CARDIOVERSION N/A 08/24/2022   Procedure: TRANSESOPHAGEAL ECHOCARDIOGRAM (TEE);  Surgeon: Luana Rumple, MD;  Location[facility] ENDOSCOPY;  Service: Cardiovascular;  Laterality: N/A;   TEE WITHOUT CARDIOVERSION N/A 10/18/2022   Procedure: TRANSESOPHAGEAL ECHOCARDIOGRAM;  Surgeon: Bartley Lightning, MD;  Location: [facility] OR;  Service: Open Heart Surgery;  Laterality: N/A;   TOTAL SHOULDER ARTHROPLASTY Right  07/24/2016   Procedure: TOTAL SHOULDER ARTHROPLASTY;  Surgeon: Osa Blase, MD;  Location: [facility] OR;  Service: Orthopedics;  Laterality: Right;   TOTAL SHOULDER ARTHROPLASTY Left 03/06/2022   Procedure: TOTAL SHOULDER ARTHROPLASTY;  Surgeon: Osa Blase, MD;  Location: [facility] ORS;  Service: Orthopedics;  Laterality: Left;   UPPER GI ENDOSCOPY       Current Meds  Medication Sig   acetaminophen  (TYLENOL ) 650 MG CR tablet Take 1,300 mg by mouth 2 (two) times daily.   allopurinol  (ZYLOPRIM ) 300 MG tablet Take 300 mg by mouth in the morning.   aspirin  EC 81 MG tablet Take 1 tablet (81 mg total) by mouth daily. Swallow whole.   buPROPion  (WELLBUTRIN  XL) 150 MG 24 hr tablet Take 300 mg by mouth in the morning.   cetirizine (ZYRTEC) 10 MG tablet Take 10 mg by mouth at bedtime.   colchicine  0.6 MG tablet Take 0.6 mg by mouth every 6 (six) hours as needed (gout flares).    famotidine  (PEPCID ) 20 MG tablet Take 20 mg by  mouth at bedtime.   fluticasone  (FLONASE ) 50 MCG/ACT nasal spray Place 1-2 sprays into both nostrils at bedtime as needed for allergies.   gabapentin  (NEURONTIN ) 300 MG capsule Take 600 mg by mouth 3 (three) times daily.   Glucosamine-Chondroitin (COSAMIN DS PO) Take 1 tablet by mouth 2 (two) times daily. Triple Strength   losartan  (COZAAR ) 25 MG tablet Take 0.5 tablets (12.5 mg total) by mouth daily.   Multiple Vitamin (MULTIVITAMIN WITH MINERALS) TABS tablet Take 1 tablet by mouth daily. Complete Multivitamin   Probiotic Product (ALIGN) 4 MG CAPS Take 4 mg by mouth daily as needed (gut health).   rosuvastatin  (CRESTOR ) 10 MG tablet Take 10 mg by mouth in the morning.   SALINE NASAL MIST NA Place 1 spray into the nose 3 (three) times daily as needed (congestion).   tamsulosin  (FLOMAX ) 0.4 MG CAPS capsule Take 0.4 mg by mouth every evening.   TURMERIC PO Take 1,000 mg by mouth in the morning and at bedtime.     Allergies:   Sulfa antibiotics and Carvedilol    Social History   Tobacco  Use   Smoking status: Former    Current packs/day: 0.00    Types: Cigarettes    Quit date: 07/12/1980    Years since quitting: 43.2   Smokeless tobacco: Never  Vaping Use   Vaping status: Never Used  Substance Use Topics   Alcohol  use: Yes    Alcohol /week: 1.0 standard drink of alcohol     Types: 1 Glasses of wine per week    Comment: occ   Drug use: No     Family Hx: The patient's family history includes Healthy in his sister; Heart Problems in his sister; Liver cancer in his brother.  ROS:   Please see the history of present illness.     All other systems reviewed and are negative.   Prior CV studies:   The following studies were reviewed today:  TEE 08/2022 IMPRESSIONS    1. Left ventricular ejection fraction, by estimation, is 65 to 70%. The  left ventricle has normal function. The left ventricle has no regional  wall motion abnormalities. The left ventricular internal cavity size was  mildly dilated.   2. Right ventricular systolic function is normal. The right ventricular  size is normal. Tricuspid regurgitation signal is inadequate for assessing  PA pressure.   3. Left atrial size was mildly dilated. No left atrial/left atrial  appendage thrombus was detected.   4. The mitral valve is normal in structure. Mild mitral valve  regurgitation.   5. 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 The aortic ejection jet is highly eccentric,  directed at the anterior mitral leaflet. The AR vena contracta is 6 mm.  The regurgitant volume is 104 ml,  regurgitant fraction is 68%. The 3D guided ERO area is 0.56 cm sq. The  pressure half time of the AR jet is 258 ms. There is holodiastolic  reversal of flow in the descending aorta. The aortic valve is tricuspid.  There is mild calcification of the aortic  valve. There is mild thickening of the aortic valve. Aortic valve  regurgitation is severe. Aortic valve sclerosis is  present, with no  evidence of aortic valve stenosis.   6. The aortic annulus is dilated (3.2 cm) and the sinuses of Valsalva are  mildly dilated (4.1 cm) , but the remainder of the aorta is normal in  caliber. Aortic dilatation  noted. There is mild dilatation of the aortic  root, measuring 41 mm. There is mild   (Grade II) layered plaque involving the descending aorta.   Labs/Other Tests and Data Reviewed:    EKG Interpretation Date/Time:  Tuesday Oct 15 2023 10:59:06 EDT Ventricular Rate:  77 PR Interval:  194 QRS Duration:  80 QT Interval:  350 QTC Calculation: 396 R Axis:   43  Text Interpretation: Normal sinus rhythm Nonspecific T wave abnormality When compared with ECG of 20-Oct-2022 07:10, Sinus rhythm has replaced Atrial fibrillation Vent. rate has decreased BY  51 BPM Nonspecific T wave abnormality no longer evident in Inferior leads Nonspecific T wave abnormality now evident in Anterior leads Confirmed by Gaylyn Keas [phone]) on 10/15/2023 11:01:39 AMEKG Interpretation Date/Time:  Tuesday Oct 15 2023 10:59:06 EDT Ventricular Rate:  77 PR Interval:  194 QRS Duration:  80 QT Interval:  350 QTC Calculation: 396 R Axis:   43  Text Interpretation: Normal sinus rhythm Nonspecific T wave abnormality When compared with ECG of 20-Oct-2022 07:10, Sinus rhythm has replaced Atrial fibrillation Vent. rate has decreased BY  51 BPM Nonspecific T wave abnormality no longer evident in Inferior leads Nonspecific T wave abnormality now evident in Anterior leads Confirmed by Gaylyn Keas [phone]) on 10/15/2023 11:01:39 AM    Recent Labs: 10/20/2022: ALT 13 10/21/2022: Magnesium  2.2 11/02/2022: Hemoglobin 9.7; Platelets 271 07/29/2023: BUN 30; Creatinine, Ser 1.10; Potassium 4.9; Sodium 139   Recent Lipid Panel Lab Results  Component Value Date/Time   CHOL 90 (L) 11/02/2022 10:55 AM   TRIG 113 11/02/2022 10:55 AM   HDL 37 (L) 11/02/2022 10:55 AM   CHOLHDL 2.4 11/02/2022 10:55 AM   LDLCALC  32 11/02/2022 10:55 AM    Wt Readings from Last 3 Encounters:  10/15/23 172 lb 9.6 oz (78.3 kg)  07/29/23 178 lb 12.8 oz (81.1 kg)  07/11/23 181 lb 6.4 oz (82.3 kg)     Risk Assessment/Calculations:          Objective:    Vital Signs:  BP 100/70   Pulse 77   Ht 6\' 1"  (1.854 m)   Wt 172 lb 9.6 oz (78.3 kg)   SpO2 99%   BMI 22.77 kg/m   GEN: Well nourished, well developed in no acute distress HEENT: Normal NECK: No JVD; No carotid bruits LYMPHATICS: No lymphadenopathy CARDIAC:RRR, no murmurs, rubs, gallops RESPIRATORY:  Clear to auscultation without rales, wheezing or rhonchi  ABDOMEN: Soft, non-tender, non-distended MUSCULOSKELETAL:  No edema; No deformity  SKIN: Warm and dry NEUROLOGIC:  Alert and oriented x 3 PSYCHIATRIC:  Normal affect  ASSESSMENT & PLAN:    Aortic valve disease -This was previously felt to be a bicuspid aortic valve and in the past mild AS  -Status post bioprosthetic AVR 10/22/2022 for severe AR -2D echo 02/22/2023 showed EF 45 to 50%, G1 DD, mild RV dysfunction, stable bioprosthetic AVR with mean AV R gradient 6 mmHg with no perivalvular regurgitation and mildly dilated aortic root at 42 mm. -repeat echo 10/2023 with normal functioning AVR and mean AVG  Aortic root and ascending aortic dilatation -Chest CTA 02/2021 showed 41mm -repeat Chest CTA 01/2022 demonstrated no aortic aneurysm.  The aorta at the sinus of Valsalva was 32 mm x 34 x 33 mm unchanged, STJ 35 mm unchanged, ascending aorta 36 mm unchanged and aortic arch 35 mm unchanged. -2D echo 02/2023 with ascending aorta 42mm  -2D echo 10/2023 ascending aorta 3.9cm and aortic root 4.1cm -check gated chest  CTA   ASCAD -Cardiac cath 09/28/2022 for workup for AVR revealed proximal LAD lesion 20%, mid LAD-1 lesion 50% and mid LAD-2 lesion 50%, mid Cx lesion 60% and proximal RCA lesion 70%.  -Status post CABG x 1 with LIMA to the LAD at the time of AVR 10/2022 - He denies any general symptoms since  he was last seen - Continue aspirin  81 mg daily and Crestor  10 mg daily with as needed refills  Hyperlipidemia -LDL goal less than 70 -I have personally reviewed and interpreted outside labs performed by patient's PCP which showed LDL 41, HDL 34 on 12/31/2022 and ALT 13 on 06/10/2023 - Continue Crestor  10 mg daily with as needed refills  Postop atrial fibrillation -He was transiently on amiodarone  postop but this is now been stopped - He denies any past occasions  Hypertension - BP controlled on exam today - Continue losartan  12.5 mg daily with as needed refills - I have personally reviewed and interpreted outside labs performed by patient's PCP which showed SCr 1.1 and K+ 4.9 on 07/29/2023  DCM -EF was normal prior to AVR -EF initially post op was 40-45% initially and improved to 45-50% on echo July 2024 -Repeat echo 02/22/2023 showed persistent EF 45 to 50% with mild RV dysfunction -GDMT has been limited by soft BP  - He appears euvolemic on exam today - Continue losartan  25 mg daily with as needed refills  Followup with me in 6 months   Medication Adjustments/Labs and Tests Ordered: Current medicines are reviewed at length with the patient today.  Concerns regarding medicines are outlined above.   Tests Ordered: Orders Placed This Encounter  Procedures   EKG 12-Lead    Medication Changes: No orders of the defined types were placed in this encounter.   Follow Up:  In Personin 6 months  Signed, Gaylyn Keas, MD  10/15/2023 11:05 AM    [facility]

## 2023-10-16 ENCOUNTER — Ambulatory Visit: Payer: Self-pay | Admitting: *Deleted

## 2023-10-16 LAB — BASIC METABOLIC PANEL WITH GFR
BUN/Creatinine Ratio: 19 (ref 10–24)
BUN: 23 mg/dL (ref 8–27)
CO2: 19 mmol/L — ABNORMAL LOW (ref 20–29)
Calcium: 9.1 mg/dL (ref 8.6–10.2)
Chloride: 100 mmol/L (ref 96–106)
Creatinine, Ser: 1.18 mg/dL (ref 0.76–1.27)
Glucose: 83 mg/dL (ref 70–99)
Potassium: 5.2 mmol/L (ref 3.5–5.2)
Sodium: 137 mmol/L (ref 134–144)
eGFR: 64 mL/min/{1.73_m2} (ref >59)

## 2024-02-05 ENCOUNTER — Telehealth: Payer: Self-pay | Admitting: Cardiology

## 2024-02-05 NOTE — Telephone Encounter (Signed)
  Patient would like to know if he needs to have an Echo before his appointment with Dr Shlomo in December. Please advise.

## 2024-02-05 NOTE — Telephone Encounter (Signed)
 Called and spoke with patient. Advised him that Dr. Shlomo would be routed this note to advise on if she would like him to have an echo done prior to visit on May 12, 2024. Patient verbalized understanding and agreeable to plan.

## 2024-02-12 ENCOUNTER — Ambulatory Visit (HOSPITAL_COMMUNITY)
Admission: RE | Admit: 2024-02-12 | Discharge: 2024-02-12 | Disposition: A | Discharge status: Home / Self Care | Source: Ambulatory Visit | Attending: Cardiology | Admitting: Cardiology

## 2024-02-12 DIAGNOSIS — I77810 Thoracic aortic ectasia: Secondary | ICD-10-CM | POA: Insufficient documentation

## 2024-02-12 MED ORDER — IOHEXOL 350 MG/ML SOLN
75.0000 mL | Freq: Once | INTRAVENOUS | Status: AC | PRN
  Administered 2024-02-12: 75 mL via INTRAVENOUS

## 2024-02-12 MED ORDER — SEVOFLURANE IN SOLN
RESPIRATORY_TRACT | Status: AC
  Filled 2024-02-12: qty 250

## 2024-02-20 NOTE — Telephone Encounter (Signed)
 Call to patient to advise that CTA showed No evidence of thoracic aortic aneurysm or dilatation. MIld coronary artery calcifications present, patient endorses he is still taking his crestor . Thyroid  nodule noted before - please forward to PCP to followup on as thyroid  US  recommended. Patient also verbalizes understanding of stable lung nodules on left.

## 2024-02-20 NOTE — Telephone Encounter (Signed)
-----   Message from Wilbert Bihari sent at 02/14/2024 12:00 AM EDT ----- No evidence of thoracic aortic aneurysm or dilatation.   MIld coronary artery calcifications  Thyroid  nodule noted before - please forward to PCP to followup on as thyroid  US  recommended.  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

## 2024-05-12 ENCOUNTER — Encounter: Payer: Self-pay | Admitting: Cardiology

## 2024-05-12 ENCOUNTER — Ambulatory Visit: Attending: Cardiology | Admitting: Cardiology

## 2024-05-12 VITALS — BP 116/72 | HR 66 | Ht 73.0 in | Wt 175.6 lb

## 2024-05-12 DIAGNOSIS — Z952 Presence of prosthetic heart valve: Secondary | ICD-10-CM

## 2024-05-12 DIAGNOSIS — E785 Hyperlipidemia, unspecified: Secondary | ICD-10-CM

## 2024-05-12 DIAGNOSIS — I77810 Thoracic aortic ectasia: Secondary | ICD-10-CM

## 2024-05-12 DIAGNOSIS — I2510 Atherosclerotic heart disease of native coronary artery without angina pectoris: Secondary | ICD-10-CM

## 2024-05-12 DIAGNOSIS — I10 Essential (primary) hypertension: Secondary | ICD-10-CM

## 2024-05-12 DIAGNOSIS — I420 Dilated cardiomyopathy: Secondary | ICD-10-CM

## 2024-05-12 DIAGNOSIS — I4891 Unspecified atrial fibrillation: Secondary | ICD-10-CM

## 2024-05-12 NOTE — Addendum Note (Signed)
 Addended by: Jalecia Leon A on: 05/12/2024 01:28 PM   Modules accepted: Orders

## 2024-05-12 NOTE — Patient Instructions (Signed)
 Medication Instructions:  Your physician recommends that you continue on your current medications as directed. Please refer to the Current Medication list given to you today.  *If you need a refill on your cardiac medications before your next appointment, please call your pharmacy*  Lab Work: TODAY: Fasting Lipid, LFTs  If you have labs (blood work) drawn today and your tests are completely normal, you will receive your results only by: MyChart Message (if you have MyChart) OR A paper copy in the mail If you have any lab test that is abnormal or we need to change your treatment, we will call you to review the results.  Testing/Procedures: NONE  Follow-Up: At [facility], you and your health needs are our priority.  As part of our continuing mission to provide you with exceptional heart care, our providers are all part of one team.  This team includes your primary Cardiologist (physician) and Advanced Practice Providers or APPs (Physician Assistants and Nurse Practitioners) who all work together to provide you with the care you need, when you need it.  Your next appointment:   1 year(s)  Provider:   Wilbert Bihari, MD

## 2024-05-12 NOTE — Progress Notes (Signed)
 Date:  05/12/2024   ID:  Theodore Patton, DOB 03/25/1946, MRN [id number] The patient was identified using 2 identifiers. PCP:  Avva, Ravisankar, MD   [facility] Providers Cardiologist:  Wilbert Bihari, MD     Evaluation Performed:  Follow-Up Visit  Chief Complaint: AI s/p AVR, CAD s/p CABG LIMA>LAD  History of Present Illness:    Theodore Patton is a 78 y.o. male with  a past medical history significant for bicuspid aortic valve with mild aortic stenosis  and mildly dilated ascending aorta at 39 mm.  He also has a history of GERD and arthritis.  He had 2D echo 01/20/2021 showed normal LV function with EF 60 to 65% with mild LVH, grade 1 diastolic dysfunction with trileaflet aortic valve and calcification of the right coronary cusp leading to mild miscoaptation and an eccentric jet of AI with moderate AI.  There was no evidence of aortic valve stenosis.  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  There was a highly eccentric ejection jet directed the anterior mitral valve leaflet with a vena contract of 6 mm and regurgitant volume 104 cc and regurgitant fraction 68% with ERO 0.56 cm and pressure half-time of AR 258.    Seen on 02/12/2022 for cardiac risk assessment for surgery.  TTE 01/02/2022 with LVEF 60 to 65%, grade 1 DD, restricted motion of the right coronary cusp.  Aortic valve is tricuspid, aortic valve regurgitation is moderate to severe, moderate dilatation of the aortic root measuring 43 mm, mild dilatation of the ascending aorta measuring 38 mm.  Plan for repeat echocardiogram in 6 months.   Repeat echocardiogram 07/26/2022 with LVEF 60 to 65%.  Severe AI and mildly dilated aortic root at 41 mm.  He underwent TEE 08/24/22 which revealed normal LVEF 65 to 70%, no RWMA, normal RV, mildly dilated LA with no LA/LAA appendage thrombus, mild MR, mild prolapse of  the left coronary cusp and dilatation of the aortic annulus, aortic valve is tricuspid with mild calcification and mild thickening, AI is severe, AV sclerosis is present with no evidence of stenosis, mild dilatation of the aortic root measuring 41 mm mild (grade II) layered plaque involving the descending aorta.  He underwent cardiac catheterization on 09/28/2022 which revealed moderate to severe multivessel disease consisting of LAD, circumflex, and right coronary artery lesions.     Seen by Dr. Lucas, cardiac surgeon, on 10/10/22 with recommendation to undergo CABG and aortic valve replacement using a bioprosthetic valve.    [facility] 10/18/2022 and underwent CABG x 1 utilizing LIMA to LAD and aortic valve replacement utilizing 27 mm Inspiris aortic valve as well as endoscopic harvest of the right greater saphenous vein which was too small to be used as a graft.  Repeat echo 7/24 showed  EF 45-50% with mid to apical septal and apica HK with improved EF from 40-45% in June with stable AVR and mean AVG and Vmax 1.45m/s.  Aortic root was 40mm and ascending aorta 41mm.  Repeat echo 02/2023 showed EF 45-50% with normal function of AVR.  Repeat 2D echo 10/11/2023 showed low normal LVF with EF 50-55% with G1DD, mild MR, normal functioning AVR with mean AVG and ascending aorta 3.9cm and aortic root 4.1cm.   He is here today and is doing well.  He denies any chest pain or  pressure, DOE, PND, orthopnea, lower extreme edema, dizziness, palpitations or syncope.  Past Medical History:  Diagnosis Date   Aortic regurgitation 01/07/2019   severe and eccentric by TEE 32024 with RF 69%..  S/P 27 mm Inspiris aortic valve 10/2022   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, s/p AVR for severe AR 5/2024c   Arthritis    CAD (coronary artery disease), native coronary artery    moderate to severe multivessel disease consisting of LAD, circumflex, and right  coronary artery lesions. S/P LIMA>LAD 10/2022   Complication of anesthesia    DCM (dilated cardiomyopathy) (HCC)    EF 45-50% by echo 02/2023   GERD (gastroesophageal reflux disease)    Gout    Hyperlipemia    PONV (postoperative nausea and vomiting)    Primary localized osteoarthritis of right knee 04/14/2019   Primary localized osteoarthrosis of right shoulder 07/24/2016   Past Surgical History:  Procedure Laterality Date   AORTIC VALVE REPLACEMENT N/A 10/18/2022   Procedure: AORTIC VALVE REPLACEMENT (AVR) USING INSPIRIS AORTIC VALVE SIZE ;  Surgeon: Lucas Dorise POUR, MD;  Location: [facility] OR;  Service: Open Heart Surgery;  Laterality: N/A;   COLONOSCOPY     CORONARY ARTERY BYPASS GRAFT N/A 10/18/2022   Procedure: CORONARY ARTERY BYPASS GRAFTING (CABG) X 1 USING LEFT INTERNAL MAMMARY  ARTERY AND ENDOSCOPICALLY HARVESTED LEFT GREATER SAPHENOUS VEIN;  Surgeon: Lucas Dorise POUR, MD;  Location: [facility] OR;  Service: Open Heart Surgery;  Laterality: N/A;   PARTIAL KNEE ARTHROPLASTY Right 04/14/2019   Procedure: UNICOMPARTMENTAL KNEE;  Surgeon: Josefina Chew, MD;  Location: [facility] ORS;  Service: Orthopedics;  Laterality: Right;   PILONIDAL CYST EXCISION     RIGHT/LEFT HEART CATH AND CORONARY ANGIOGRAPHY N/A 09/28/2022   Procedure: RIGHT/LEFT HEART CATH AND CORONARY ANGIOGRAPHY;  Surgeon: Wendel Lurena POUR, MD;  Location: [facility] INVASIVE CV LAB;  Service: Cardiovascular;  Laterality: N/A;   SHOULDER ARTHROSCOPY Right 2008   TEE WITHOUT CARDIOVERSION N/A 08/24/2022   Procedure: TRANSESOPHAGEAL ECHOCARDIOGRAM (TEE);  Surgeon: Francyne Headland, MD;  Location: [facility] ENDOSCOPY;  Service: Cardiovascular;  Laterality: N/A;   TEE WITHOUT CARDIOVERSION N/A 10/18/2022   Procedure: TRANSESOPHAGEAL ECHOCARDIOGRAM;  Surgeon: Lucas Dorise POUR, MD;  Location: [facility] OR;  Service: Open Heart Surgery;  Laterality: N/A;   TOTAL SHOULDER ARTHROPLASTY Right 07/24/2016   Procedure: TOTAL SHOULDER ARTHROPLASTY;  Surgeon: Chew Josefina, MD;   Location: [facility] OR;  Service: Orthopedics;  Laterality: Right;   TOTAL SHOULDER ARTHROPLASTY Left 03/06/2022   Procedure: TOTAL SHOULDER ARTHROPLASTY;  Surgeon: Josefina Chew, MD;  Location: [facility] ORS;  Service: Orthopedics;  Laterality: Left;   UPPER GI ENDOSCOPY       Current Meds  Medication Sig   acetaminophen  (TYLENOL ) 650 MG CR tablet Take 1,300 mg by mouth 2 (two) times daily.   allopurinol  (ZYLOPRIM ) 300 MG tablet Take 300 mg by mouth in the morning.   buPROPion  (WELLBUTRIN  XL) 150 MG 24 hr tablet Take 300 mg by mouth in the morning.   cetirizine (ZYRTEC) 10 MG tablet Take 10 mg by mouth at bedtime.   colchicine  0.6 MG tablet Take 0.6 mg by mouth every 6 (six) hours as needed (gout flares).    famotidine  (PEPCID ) 20 MG tablet Take 20 mg by mouth at bedtime.   fluticasone  (FLONASE ) 50 MCG/ACT nasal spray Place 1-2 sprays into both nostrils at bedtime as needed for allergies.   gabapentin  (NEURONTIN ) 300 MG capsule Take 600 mg by mouth  3 (three) times daily.   Glucosamine-Chondroitin (COSAMIN DS PO) Take 1 tablet by mouth 2 (two) times daily. Triple Strength   losartan  (COZAAR ) 25 MG tablet Take 0.5 tablets (12.5 mg total) by mouth daily.   Multiple Vitamin (MULTIVITAMIN WITH MINERALS) TABS tablet Take 1 tablet by mouth daily. Complete Multivitamin   Probiotic Product (ALIGN) 4 MG CAPS Take 4 mg by mouth daily as needed (gut health).   rosuvastatin  (CRESTOR ) 10 MG tablet Take 10 mg by mouth in the morning.   SALINE NASAL MIST NA Place 1 spray into the nose 3 (three) times daily as needed (congestion).   tamsulosin  (FLOMAX ) 0.4 MG CAPS capsule Take 0.4 mg by mouth every evening.   TURMERIC PO Take 1,000 mg by mouth in the morning and at bedtime.     Allergies:   Sulfa antibiotics and Carvedilol    Social History   Tobacco Use   Smoking status: Former    Current packs/day: 0.00    Types: Cigarettes    Quit date: 07/12/1980    Years since quitting: 43.8   Smokeless tobacco: Never   Vaping Use   Vaping status: Never Used  Substance Use Topics   Alcohol  use: Yes    Alcohol /week: 1.0 standard drink of alcohol     Types: 1 Glasses of wine per week    Comment: occ   Drug use: No     Family Hx: The patient's family history includes Healthy in his sister; Heart Problems in his sister; Liver cancer in his brother.  ROS:   Please see the history of present illness.     All other systems reviewed and are negative.   Prior CV studies:   The following studies were reviewed today:  TEE 08/2022 IMPRESSIONS    1. Left ventricular ejection fraction, by estimation, is 65 to 70%. The  left ventricle has normal function. The left ventricle has no regional  wall motion abnormalities. The left ventricular internal cavity size was  mildly dilated.   2. Right ventricular systolic function is normal. The right ventricular  size is normal. Tricuspid regurgitation signal is inadequate for assessing  PA pressure.   3. Left atrial size was mildly dilated. No left atrial/left atrial  appendage thrombus was detected.   4. The mitral valve is normal in structure. Mild mitral valve  regurgitation.   5. 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 The aortic ejection jet is highly eccentric,  directed at the anterior mitral leaflet. The AR vena contracta is 6 mm.  The regurgitant volume is 104 ml,  regurgitant fraction is 68%. The 3D guided ERO area is 0.56 cm sq. The  pressure half time of the AR jet is 258 ms. There is holodiastolic  reversal of flow in the descending aorta. The aortic valve is tricuspid.  There is mild calcification of the aortic  valve. There is mild thickening of the aortic valve. Aortic valve  regurgitation is severe. Aortic valve sclerosis is present, with no  evidence of aortic valve stenosis.   6. The aortic annulus is dilated (3.2 cm) and the sinuses of Valsalva are  mildly dilated (4.1 cm) , but the  remainder of the aorta is normal in  caliber. Aortic dilatation noted. There is mild dilatation of the aortic  root, measuring 41 mm. There is mild   (Grade II) layered plaque involving the descending aorta.   Labs/Other Tests and Data Reviewed:  Recent Labs: 10/15/2023: BUN 23; Creatinine, Ser 1.18; Potassium 5.2; Sodium 137   Recent Lipid Panel Lab Results  Component Value Date/Time   CHOL 90 (L) 11/02/2022 10:55 AM   TRIG 113 11/02/2022 10:55 AM   HDL 37 (L) 11/02/2022 10:55 AM   CHOLHDL 2.4 11/02/2022 10:55 AM   LDLCALC 32 11/02/2022 10:55 AM    Wt Readings from Last 3 Encounters:  05/12/24 175 lb 9.6 oz (79.7 kg)  10/15/23 172 lb 9.6 oz (78.3 kg)  07/29/23 178 lb 12.8 oz (81.1 kg)     Risk Assessment/Calculations:          Objective:    Vital Signs:  BP 116/72   Pulse 66   Ht 6' 1 (1.854 m)   Wt 175 lb 9.6 oz (79.7 kg)   SpO2 97%   BMI 23.17 kg/m   GEN: Well nourished, well developed in no acute distress HEENT: Normal NECK: No JVD; No carotid bruits LYMPHATICS: No lymphadenopathy CARDIAC:RRR, no murmurs, rubs, gallops RESPIRATORY:  Clear to auscultation without rales, wheezing or rhonchi  ABDOMEN: Soft, non-tender, non-distended MUSCULOSKELETAL:  No edema; No deformity  SKIN: Warm and dry NEUROLOGIC:  Alert and oriented x 3 PSYCHIATRIC:  Normal affect  ASSESSMENT & PLAN:    Aortic valve disease -This was previously felt to be a bicuspid aortic valve and in the past mild AS  -Status post bioprosthetic AVR 10/22/2022 for severe AR -2D echo 02/22/2023 showed EF 45 to 50%, G1 DD, mild RV dysfunction, stable bioprosthetic AVR with mean AV R gradient 6 mmHg with no perivalvular regurgitation and mildly dilated aortic root at 42 mm. -repeat echo 10/2023 with normal functioning AVR and mean AVG  Aortic root and ascending aortic dilatation -Chest CTA 02/2021 showed 41mm -repeat Chest CTA 01/2022 demonstrated no aortic aneurysm.  The aorta at the  sinus of Valsalva was 32 mm x 34 x 33 mm unchanged, STJ 35 mm unchanged, ascending aorta 36 mm unchanged and aortic arch 35 mm unchanged. -2D echo 02/2023 with ascending aorta 42mm  -2D echo 10/2023 ascending aorta 3.9cm and aortic root 4.1cm -Gated chest CTA 02/12/2024 showed no evidence of aortic aneurysm or dissection.   ASCAD -Cardiac cath 09/28/2022 for workup for AVR revealed proximal LAD lesion 20%, mid LAD-1 lesion 50% and mid LAD-2 lesion 50%, mid Cx lesion 60% and proximal RCA lesion 70%.  -Status post CABG x 1 with LIMA to the LAD at the time of AVR 10/2022 -He has not had any anginal symptoms since I saw him last -Continue aspirin  81 mg daily and Crestor  10 mg daily with as needed refills  Hyperlipidemia -LDL goal less than 70 -I have personally reviewed and interpreted outside labs performed by patient's PCP which showed LDL 41, HDL 34 on 12/31/2022 -Repeat FLP and ALT -Continue Crestor  10 mg daily with as needed refills  Postop atrial fibrillation -He was transiently on amiodarone  postop but this is now been stopped -He has not had any palpitations or return of A-fib that he is aware of  Hypertension - BP is well-controlled today at 116/72 mmHg - Continue losartan  12.5 mg daily with as needed refills - I have personally reviewed and interpreted outside labs performed by patient's PCP which showed serum creatinine 1.18 and potassium 5.2 on 10/15/2023  DCM -EF was normal prior to AVR -EF initially post op was 40-45% initially and improved to 45-50% on echo July 2024 -Repeat echo 02/22/2023 showed persistent EF 45 to 50% with mild RV dysfunction -  2D echo 10/2023: 50-55% low normal -GDMT has been limited by soft BP  -Does not appear volume overloaded on exam today -Continue losartan  12.5 mg daily with as needed refills  Followup with me in 1 year   Medication Adjustments/Labs and Tests Ordered: Current medicines are reviewed at length with the patient today.  Concerns  regarding medicines are outlined above.   Tests Ordered: No orders of the defined types were placed in this encounter.   Medication Changes: No orders of the defined types were placed in this encounter.   Follow Up:  In Personin 6 months  Signed, Wilbert Bihari, MD  05/12/2024 1:22 PM     [facility]

## 2024-05-13 ENCOUNTER — Ambulatory Visit: Payer: Self-pay | Admitting: Cardiology

## 2024-05-13 LAB — LIPID PANEL
Chol/HDL Ratio: 2.8 ratio (ref 0.0–5.0)
Cholesterol, Total: 100 mg/dL (ref 100–199)
HDL: 36 mg/dL — ABNORMAL LOW (ref >39)
LDL Chol Calc (NIH): 39 mg/dL (ref 0–99)
Triglycerides: 144 mg/dL (ref 0–149)
VLDL Cholesterol Cal: 25 mg/dL (ref 5–40)

## 2024-05-13 LAB — HEPATIC FUNCTION PANEL
ALT: 16 IU/L (ref 0–44)
AST: 19 IU/L (ref 0–40)
Albumin: 4.5 g/dL (ref 3.8–4.8)
Alkaline Phosphatase: 58 IU/L (ref 47–123)
Bilirubin Total: 0.3 mg/dL (ref 0.0–1.2)
Bilirubin, Direct: 0.14 mg/dL (ref 0.00–0.40)
Total Protein: 6.2 g/dL (ref 6.0–8.5)

## 2024-07-05 ENCOUNTER — Other Ambulatory Visit: Payer: Self-pay | Admitting: Cardiology
